# Patient Record
Sex: Female | Born: 1970 | Race: White | Hispanic: No | Smoking: Never smoker
Health system: Southern US, Community
[De-identification: ages and names within clinical notes are randomized; demographics above are authoritative.]

## PROBLEM LIST (undated history)

## (undated) DIAGNOSIS — B977 Papillomavirus as the cause of diseases classified elsewhere: Secondary | ICD-10-CM

## (undated) DIAGNOSIS — G4730 Sleep apnea, unspecified: Secondary | ICD-10-CM

## (undated) DIAGNOSIS — M797 Fibromyalgia: Secondary | ICD-10-CM

## (undated) DIAGNOSIS — I495 Sick sinus syndrome: Secondary | ICD-10-CM

## (undated) DIAGNOSIS — U071 COVID-19: Secondary | ICD-10-CM

## (undated) DIAGNOSIS — F99 Mental disorder, not otherwise specified: Secondary | ICD-10-CM

## (undated) DIAGNOSIS — R87629 Unspecified abnormal cytological findings in specimens from vagina: Secondary | ICD-10-CM

## (undated) DIAGNOSIS — M51369 Other intervertebral disc degeneration, lumbar region without mention of lumbar back pain or lower extremity pain: Secondary | ICD-10-CM

## (undated) DIAGNOSIS — T7840XA Allergy, unspecified, initial encounter: Secondary | ICD-10-CM

## (undated) DIAGNOSIS — D75839 Thrombocytosis, unspecified: Secondary | ICD-10-CM

## (undated) DIAGNOSIS — M87059 Idiopathic aseptic necrosis of unspecified femur: Secondary | ICD-10-CM

## (undated) DIAGNOSIS — K219 Gastro-esophageal reflux disease without esophagitis: Secondary | ICD-10-CM

## (undated) DIAGNOSIS — M21612 Bunion of left foot: Secondary | ICD-10-CM

## (undated) DIAGNOSIS — M5136 Other intervertebral disc degeneration, lumbar region: Secondary | ICD-10-CM

## (undated) DIAGNOSIS — B009 Herpesviral infection, unspecified: Secondary | ICD-10-CM

## (undated) DIAGNOSIS — Z5189 Encounter for other specified aftercare: Secondary | ICD-10-CM

## (undated) DIAGNOSIS — F419 Anxiety disorder, unspecified: Secondary | ICD-10-CM

## (undated) DIAGNOSIS — M069 Rheumatoid arthritis, unspecified: Secondary | ICD-10-CM

## (undated) DIAGNOSIS — D649 Anemia, unspecified: Secondary | ICD-10-CM

## (undated) HISTORY — DX: Allergy, unspecified, initial encounter: T78.40XA

## (undated) HISTORY — DX: Mental disorder, not otherwise specified: F99

## (undated) HISTORY — DX: Other intervertebral disc degeneration, lumbar region: M51.36

## (undated) HISTORY — DX: Other intervertebral disc degeneration, lumbar region without mention of lumbar back pain or lower extremity pain: M51.369

## (undated) HISTORY — DX: Encounter for other specified aftercare: Z51.89

## (undated) HISTORY — DX: Herpesviral infection, unspecified: B00.9

## (undated) HISTORY — DX: Fibromyalgia: M79.7

## (undated) HISTORY — DX: Anemia, unspecified: D64.9

## (undated) HISTORY — DX: Unspecified abnormal cytological findings in specimens from vagina: R87.629

## (undated) HISTORY — DX: Papillomavirus as the cause of diseases classified elsewhere: B97.7

## (undated) HISTORY — DX: Gastro-esophageal reflux disease without esophagitis: K21.9

## (undated) HISTORY — DX: Sleep apnea, unspecified: G47.30

## (undated) HISTORY — PX: REDUCTION MAMMAPLASTY: SUR839

## (undated) HISTORY — DX: Anxiety disorder, unspecified: F41.9

## (undated) HISTORY — DX: Rheumatoid arthritis, unspecified: M06.9

## (undated) HISTORY — DX: Idiopathic aseptic necrosis of unspecified femur: M87.059

## (undated) HISTORY — DX: COVID-19: U07.1

## (undated) HISTORY — DX: Thrombocytosis, unspecified: D75.839

## (undated) HISTORY — PX: OTHER SURGICAL HISTORY: SHX169

## (undated) HISTORY — DX: Bunion of left foot: M21.612

## (undated) HISTORY — DX: Sick sinus syndrome: I49.5

## (undated) HISTORY — PX: JOINT REPLACEMENT: SHX530

## (undated) HISTORY — PX: BREAST BIOPSY: SHX20

---

## 2008-11-08 HISTORY — PX: GASTRIC BYPASS: SHX52

## 2016-11-08 HISTORY — PX: SHOULDER ARTHROSCOPY: SHX128

## 2019-06-01 HISTORY — PX: REVISION TOTAL KNEE ARTHROPLASTY: SHX767

## 2019-08-07 HISTORY — PX: TUBAL LIGATION: SHX77

## 2019-08-09 HISTORY — PX: OTHER SURGICAL HISTORY: SHX169

## 2019-08-22 HISTORY — PX: HYSTEROSCOPY WITH D & C: SHX1775

## 2020-08-06 HISTORY — PX: PACEMAKER PLACEMENT: SHX43

## 2020-10-14 ENCOUNTER — Emergency Department
Admission: EM | Admit: 2020-10-14 | Discharge: 2020-10-14 | Disposition: A | Discharge status: Home / Self Care | Payer: BLUE CROSS/BLUE SHIELD | Attending: Student in an Organized Health Care Education/Training Program | Admitting: Student in an Organized Health Care Education/Training Program

## 2020-10-14 ENCOUNTER — Other Ambulatory Visit: Payer: Self-pay

## 2020-10-14 DIAGNOSIS — N10 Acute tubulo-interstitial nephritis: Secondary | ICD-10-CM | POA: Insufficient documentation

## 2020-10-14 DIAGNOSIS — R300 Dysuria: Secondary | ICD-10-CM | POA: Diagnosis present

## 2020-10-14 DIAGNOSIS — N390 Urinary tract infection, site not specified: Secondary | ICD-10-CM | POA: Diagnosis not present

## 2020-10-14 DIAGNOSIS — N12 Tubulo-interstitial nephritis, not specified as acute or chronic: Secondary | ICD-10-CM

## 2020-10-14 LAB — URINALYSIS, COMPLETE (UACMP) WITH MICROSCOPIC
Bilirubin Urine: NEGATIVE
Glucose, UA: NEGATIVE mg/dL
Ketones, ur: 5 mg/dL — AB
Nitrite: POSITIVE — AB
Protein, ur: 30 mg/dL — AB
RBC / HPF: >50 RBC/hpf — ABNORMAL HIGH (ref 0–5)
Specific Gravity, Urine: 1.02 (ref 1.005–1.030)
WBC, UA: >50 WBC/hpf — ABNORMAL HIGH (ref 0–5)
pH: 5 (ref 5.0–8.0)

## 2020-10-14 MED ORDER — LEVOFLOXACIN 750 MG PO TABS: 750.0000 mg | ORAL_TABLET | Freq: Every day | ORAL | 0 refills | Status: AC

## 2020-10-14 MED ORDER — LEVOFLOXACIN 750 MG PO TABS
750.0000 mg | ORAL_TABLET | Freq: Once | ORAL | Status: AC
  Administered 2020-10-14: 750 mg via ORAL
  Filled 2020-10-14: qty 1

## 2020-10-14 NOTE — ED Notes (Signed)
Pt agreeable with d/c plan as discussed by provider- this nurse has verbally reinforced d/c instructions and provided pt with written copy - pt acknowledges verbal understanding and denies any additional questions concerns needs.  Ambulatory at discharge; steady gait; no acute distress.

## 2020-10-14 NOTE — ED Notes (Signed)
Called lab to add on urine culture to previously sent specimen. Per Marylene Land in Lab, will add on urine culture.

## 2020-10-14 NOTE — Discharge Instructions (Addendum)
Take Levaquin once daily for the next five days.  Return to the emergency department for reevaluation with worsening back pain, nausea, vomiting or fever.

## 2020-10-14 NOTE — ED Triage Notes (Signed)
Pt comes pov with possible uti/kidney infection. Hx of same. Symptoms of same.

## 2020-10-14 NOTE — ED Provider Notes (Signed)
____________________________________________  Time seen: Approximately 6:56 PM  I have reviewed the triage vital signs and the nursing notes.   HISTORY  Chief Complaint Urinary Tract Infection   Historian Patient    HPI Diana Giles is a 49 y.o. female presents to the emergency department with dysuria and increased urinary frequency for the past 2 days.  Patient denies low back pain.  She has noticed hematuria.  She has had chills and has not taken her temperature at home.  She reports a history of UTIs in the past and states that her last UTI was in October and she was on Macrobid.  She denies changes in vaginal discharge or dyspareunia.  No other alleviating measures been attempted.   History reviewed. No pertinent past medical history.    Immunizations up to date:  Yes.     History reviewed. No pertinent past medical history.  There are no problems to display for this patient.     Prior to Admission medications   Medication Sig Start Date End Date Taking? Authorizing Provider  levofloxacin (LEVAQUIN) 750 MG tablet Take 1 tablet (750 mg total) by mouth daily for 5 days. 10/14/20 10/19/20  Orvil Feil, PA-C    Allergies Nickel, Penicillins, and Shellfish allergy  History reviewed. No pertinent family history.  Social History Social History   Tobacco Use  . Smoking status: Not on file  Substance Use Topics  . Alcohol use: Not on file  . Drug use: Not on file     Review of Systems  Constitutional: No fever/chills Eyes:  No discharge ENT: No upper respiratory complaints. Respiratory: no cough. No SOB/ use of accessory muscles to breath Gastrointestinal:   No nausea, no vomiting.  No diarrhea.  No constipation. Genitourinary: Patient has dysuria and increased urinary frequency.  Musculoskeletal: Negative for musculoskeletal pain. Skin: Negative for rash, abrasions, lacerations,  ecchymosis.    ____________________________________________   PHYSICAL EXAM:  VITAL SIGNS: ED Triage Vitals  Enc Vitals Group     BP 10/14/20 1634 (!) 102/48     Pulse Rate 10/14/20 1634 91     Resp 10/14/20 1634 18     Temp 10/14/20 1634 98.5 F (36.9 C)     Temp Source 10/14/20 1634 Oral     SpO2 10/14/20 1634 100 %     Weight 10/14/20 1632 195 lb (88.5 kg)     Height 10/14/20 1632 5\' 1"  (1.549 m)     Head Circumference --      Peak Flow --      Pain Score 10/14/20 1632 8     Pain Loc --      Pain Edu? --      Excl. in GC? --      Constitutional: Alert and oriented. Well appearing and in no acute distress. Eyes: Conjunctivae are normal. PERRL. EOMI. Head: Atraumatic.  Cardiovascular: Normal rate, regular rhythm. Normal S1 and S2.  Good peripheral circulation. Respiratory: Normal respiratory effort without tachypnea or retractions. Lungs CTAB. Good air entry to the bases with no decreased or absent breath sounds Gastrointestinal: Bowel sounds x 4 quadrants. Soft and nontender to palpation. No guarding or rigidity. No distention. Genitourinary: No CVA tenderness. Musculoskeletal: Full range of motion to all extremities. No obvious deformities noted Neurologic:  Normal for age. No gross focal neurologic deficits are appreciated.  Skin:  Skin is warm, dry and intact. No rash noted. Psychiatric: Mood and affect are normal for age. Speech and behavior are normal.  ____________________________________________   LABS (all labs ordered are listed, but only abnormal results are displayed)  Labs Reviewed  URINALYSIS, COMPLETE (UACMP) WITH MICROSCOPIC - Abnormal; Notable for the following components:      Result Value   Color, Urine AMBER (*)    APPearance CLOUDY (*)    Hgb urine dipstick LARGE (*)    Ketones, ur 5 (*)    Protein, ur 30 (*)    Nitrite POSITIVE (*)    Leukocytes,Ua MODERATE (*)    RBC / HPF >50 (*)    WBC, UA >50 (*)    Bacteria, UA MANY (*)    All  other components within normal limits  URINE CULTURE   ____________________________________________  EKG   ____________________________________________  RADIOLOGY  No results found.  ____________________________________________    PROCEDURES  Procedure(s) performed:     Procedures     Medications  levofloxacin (LEVAQUIN) tablet 750 mg (has no administration in time range)     ____________________________________________   INITIAL IMPRESSION / ASSESSMENT AND PLAN / ED COURSE  Pertinent labs & imaging results that were available during my care of the patient were reviewed by me and considered in my medical decision making (see chart for details).    Assessment and plan Pyelonephritis 49 year old female presents to the emergency department with dysuria, hematuria and increased urinary frequency.  Urinalysis shows many bacteria, moderate leukocytes and a large amount of blood.  Patient has a history of pyelonephritis in the past.  We will treat with Levaquin to cover patient for pyelonephritis.  Patient was started on her first dose in the emergency department.  She was given return precautions to return with worsening low back pain, nausea, vomiting or fever.  She voiced understanding and has easy access to the emergency department should her symptoms change or worsen.     ____________________________________________  FINAL CLINICAL IMPRESSION(S) / ED DIAGNOSES  Final diagnoses:  Pyelonephritis      NEW MEDICATIONS STARTED DURING THIS VISIT:  ED Discharge Orders         Ordered    levofloxacin (LEVAQUIN) 750 MG tablet  Daily        10/14/20 1852              This chart was dictated using voice recognition software/Dragon. Despite best efforts to proofread, errors can occur which can change the meaning. Any change was purely unintentional.     Orvil Feil, PA-C 10/14/20 1900    Willy Eddy, MD 10/14/20 2221

## 2020-10-17 LAB — URINE CULTURE: Culture: >=100000 — AB

## 2020-10-18 NOTE — Progress Notes (Signed)
ED Antimicrobial Stewardship Positive Culture Follow Up   Diana Giles is an 49 y.o. female who presented to [facility] on 10/14/2020 with a chief complaint of  Chief Complaint  Patient presents with  . Urinary Tract Infection    Recent Results (from the past 720 hour(s))  Urine culture     Status: Abnormal   Collection Time: 10/14/20  3:25 PM   Specimen: Urine, Random  Result Value Ref Range Status   Specimen Description   Final    URINE, RANDOM Performed at [facility], [address] [place], [place] [address]    Special Requests   Final    NONE Performed at [facility], [address] [place], [place] [address]    Culture (A)  Final    >=100,000 COLONIES/mL ESCHERICHIA COLI Confirmed Extended Spectrum Beta-Lactamase Producer (ESBL).  In bloodstream infections from ESBL organisms, carbapenems are preferred over piperacillin/tazobactam. They are shown to have a lower risk of mortality.    Report Status 10/17/2020 FINAL  Final   Organism ID, Bacteria ESCHERICHIA COLI (A)  Final      Susceptibility   Escherichia coli - MIC*    AMPICILLIN >=32 RESISTANT Resistant     CEFAZOLIN >=64 RESISTANT Resistant     CEFEPIME 16 RESISTANT Resistant     CEFTRIAXONE >=64 RESISTANT Resistant     CIPROFLOXACIN >=4 RESISTANT Resistant     GENTAMICIN >=16 RESISTANT Resistant     IMIPENEM <=0.25 SENSITIVE Sensitive     NITROFURANTOIN <=16 SENSITIVE Sensitive     TRIMETH/SULFA <=20 SENSITIVE Sensitive     AMPICILLIN/SULBACTAM >=32 RESISTANT Resistant     PIP/TAZO <=4 SENSITIVE Sensitive     * >=100,000 COLONIES/mL ESCHERICHIA COLI    [x]  Treated with levaquin, organism resistant to prescribed antimicrobial  (ESBL E.coli)  New antibiotic prescription: Fosfomycin 3gm packet- take  Orally every other day x 3 Doses (packets).  If not improved, then see PCP for possible set up of outpatient IV antibiotic? Or if unable to set up outpatient abx then return to hospital  for assessment.  ED Provider:  10/18/20  1505.  Called pt phone # in chart [phone].  Left voicemail.   Kiyonna Tortorelli A 10/18/2020, 3:06 PM Clinical Pharmacist

## 2020-10-18 NOTE — Progress Notes (Signed)
ED Antimicrobial Stewardship Positive Culture Follow Up   Diana Giles is an 49 y.o. female who presented to [facility] on 10/14/2020 with a chief complaint of  Chief Complaint  Patient presents with  . Urinary Tract Infection    Recent Results (from the past 720 hour(s))  Urine culture     Status: Abnormal   Collection Time: 10/14/20  3:25 PM   Specimen: Urine, Random  Result Value Ref Range Status   Specimen Description   Final    URINE, RANDOM Performed at [facility], [address] [place], [place] [address]    Special Requests   Final    NONE Performed at [facility], [address] [place], [place] [address]    Culture (A)  Final    >=100,000 COLONIES/mL ESCHERICHIA COLI Confirmed Extended Spectrum Beta-Lactamase Producer (ESBL).  In bloodstream infections from ESBL organisms, carbapenems are preferred over piperacillin/tazobactam. They are shown to have a lower risk of mortality.    Report Status 10/17/2020 FINAL  Final   Organism ID, Bacteria ESCHERICHIA COLI (A)  Final      Susceptibility   Escherichia coli - MIC*    AMPICILLIN >=32 RESISTANT Resistant     CEFAZOLIN >=64 RESISTANT Resistant     CEFEPIME 16 RESISTANT Resistant     CEFTRIAXONE >=64 RESISTANT Resistant     CIPROFLOXACIN >=4 RESISTANT Resistant     GENTAMICIN >=16 RESISTANT Resistant     IMIPENEM <=0.25 SENSITIVE Sensitive     NITROFURANTOIN <=16 SENSITIVE Sensitive     TRIMETH/SULFA <=20 SENSITIVE Sensitive     AMPICILLIN/SULBACTAM >=32 RESISTANT Resistant     PIP/TAZO <=4 SENSITIVE Sensitive     * >=100,000 COLONIES/mL ESCHERICHIA COLI    [x]  Treated with levaquin, organism resistant to prescribed antimicrobial  (ESBL E.coli)  New antibiotic prescription: Fosfomycin 3gm packet- take  1 packet Orally every other day x 3 Doses (packets).  If not improved, then see PCP for possible set up of outpatient IV antibiotic? Or if unable to set up outpatient abx then return to  hospital for assessment.  ED Provider:  10/18/20  1505.  Called pt phone # in chart [phone].  Left voicemail. 10/18/20 1525- patient returned call. RPh called in above Rx to [facility] in [place]. Explained to patient to stop taking levofloxacin and start new antibiotic (along with info above)   Muneer Leider A 10/18/2020, 3:28 PM Clinical Pharmacist

## 2020-11-13 ENCOUNTER — Other Ambulatory Visit: Payer: Self-pay

## 2020-11-13 ENCOUNTER — Ambulatory Visit (INDEPENDENT_AMBULATORY_CARE_PROVIDER_SITE_OTHER): Payer: No Typology Code available for payment source | Admitting: Nurse Practitioner

## 2020-11-13 ENCOUNTER — Encounter (INDEPENDENT_AMBULATORY_CARE_PROVIDER_SITE_OTHER): Payer: Self-pay | Admitting: Nurse Practitioner

## 2020-11-13 VITALS — BP 108/70 | HR 96 | Temp 96.9°F | Ht 60.25 in | Wt 192.0 lb

## 2020-11-13 DIAGNOSIS — M069 Rheumatoid arthritis, unspecified: Secondary | ICD-10-CM | POA: Diagnosis not present

## 2020-11-13 DIAGNOSIS — I495 Sick sinus syndrome: Secondary | ICD-10-CM | POA: Insufficient documentation

## 2020-11-13 DIAGNOSIS — Z8639 Personal history of other endocrine, nutritional and metabolic disease: Secondary | ICD-10-CM | POA: Diagnosis not present

## 2020-11-13 DIAGNOSIS — F419 Anxiety disorder, unspecified: Secondary | ICD-10-CM

## 2020-11-13 DIAGNOSIS — R5383 Other fatigue: Secondary | ICD-10-CM | POA: Diagnosis not present

## 2020-11-13 DIAGNOSIS — M5136 Other intervertebral disc degeneration, lumbar region: Secondary | ICD-10-CM

## 2020-11-13 DIAGNOSIS — D75839 Thrombocytosis, unspecified: Secondary | ICD-10-CM | POA: Insufficient documentation

## 2020-11-13 DIAGNOSIS — E669 Obesity, unspecified: Secondary | ICD-10-CM | POA: Diagnosis not present

## 2020-11-13 DIAGNOSIS — M51369 Other intervertebral disc degeneration, lumbar region without mention of lumbar back pain or lower extremity pain: Secondary | ICD-10-CM

## 2020-11-13 DIAGNOSIS — F909 Attention-deficit hyperactivity disorder, unspecified type: Secondary | ICD-10-CM

## 2020-11-13 DIAGNOSIS — M87051 Idiopathic aseptic necrosis of right femur: Secondary | ICD-10-CM

## 2020-11-13 DIAGNOSIS — Z6837 Body mass index (BMI) 37.0-37.9, adult: Secondary | ICD-10-CM

## 2020-11-13 NOTE — Progress Notes (Signed)
Subjective:  Patient ID: Diana Giles, female    DOB: February 23, 1971  Age: 50 y.o. MRN: [id number]  CC:  Chief Complaint  Patient presents with  . Establish Care      HPI  This patient arrives today for the above.  She moved to the area from [place] a few months ago and she is now establishing care here as her primary office.  She has multiple medical problems that need referrals to specialist.  Rheumatoid arthritis: Tells me she was diagnosed with rheumatoid arthritis in the past and this has resulted in significant pain to the joints of her hands, feet, and knee.  She has undergone total knee replacement to the right knee twice.  She is not currently on any chronic medication for rheumatoid arthritis that would like to be evaluated and managed by rheumatologist in the area.  DDD: She has history of degenerative disc disease and tells me specifically its from L3-L5.  She did have an epidural pain shot administered back in [place], and tells me she was told to follow-up with provider that administered these.  She would like referral to someone in this area that can help manage this.  Avascular necrosis of the right hip: She tells me that she was diagnosed with this greater than 10 years ago.  She tells me she is in chronic pain and will often times fall.  She does use a cane as needed.  As far she knows she has not been told what specific treatment she may need to undergo.  She tells me that she thinks at one point they are planning on having her undergo MRI but this never was completed.  Sinus node dysfunction: She tells me back in September 2021 she had a syncopal episode.  She was evaluated in the hospital and was diagnosed with sinus node dysfunction.  Pacemaker was ultimately placed.  She was told to follow-up with cardiology on a regular basis and needs to be seen by cardiology in this area.  Thrombocytosis: She tells me she has a history of elevated platelets and was being  evaluated and followed by hematology in the past.  She tells me that her platelet count has been up to 700/800.  She tells me last time it was checked it was in the low 500s.  Anxiety/ADHD: She also has a history of anxiety and ADHD for which she takes hydroxyzine as needed, Klonopin as needed, and Adderall on days that she works.    Past Medical History:  Diagnosis Date  . Anxiety   . Avascular necrosis of hip (HCC)   . Bunion of left foot   . COVID-19   . DDD (degenerative disc disease), lumbar   . Rheumatoid arthritis (HCC)   . Sinus node dysfunction (HCC)   . Thrombocytosis       History reviewed. No pertinent family history.  Social History   Social History Narrative  . Not on file   Social History   Tobacco Use  . Smoking status: Never Smoker  . Smokeless tobacco: Never Used  Substance Use Topics  . Alcohol use: Not Currently     Current Meds  Medication Sig  . acetaminophen (TYLENOL) 500 MG tablet Take 500 mg by mouth every 6 (six) hours as needed.  Marland Kitchen amphetamine-dextroamphetamine (ADDERALL XR) 20 MG 24 hr capsule Take 20 mg by mouth daily as needed.  . clonazePAM (KLONOPIN) 1 MG tablet Take 1 mg by mouth 3 (three) times daily as  needed for anxiety.  . DULoxetine (CYMBALTA) 60 MG capsule Take 60 mg by mouth 2 (two) times daily.  Marland Kitchen EPINEPHrine 0.3 mg/0.3 mL IJ SOAJ injection Inject 0.3 mg into the muscle as needed for anaphylaxis.  . hydrOXYzine (VISTARIL) 25 MG capsule Take 25 mg by mouth 3 (three) times daily as needed.  Marland Kitchen ibuprofen (ADVIL) 200 MG tablet Take 400 mg by mouth every 6 (six) hours as needed.    ROS:  See HPI   Objective:   Today's Vitals: BP 108/70   Pulse 96   Temp (!) 96.9 F (36.1 C) (Temporal)   Ht 5' 0.25" (1.53 m)   Wt 192 lb (87.1 kg)   SpO2 98%   BMI 37.19 kg/m  Vitals with BMI 11/13/2020 10/14/2020  Height 5' 0.25" 5\' 1"   Weight 192 lbs 195 lbs  BMI 16.1 09.60  Systolic 454 098  Diastolic 70 48  Pulse 96 91      Physical Exam Vitals reviewed.  Constitutional:      General: She is not in acute distress.    Appearance: Normal appearance.  HENT:     Head: Normocephalic and atraumatic.  Neck:     Vascular: No carotid bruit.  Cardiovascular:     Rate and Rhythm: Normal rate and regular rhythm.     Pulses: Normal pulses.     Heart sounds: Normal heart sounds.  Pulmonary:     Effort: Pulmonary effort is normal.     Breath sounds: Normal breath sounds.  Skin:    General: Skin is warm and dry.  Neurological:     General: No focal deficit present.     Mental Status: She is alert and oriented to person, place, and time.  Psychiatric:        Mood and Affect: Mood normal.        Behavior: Behavior normal.        Judgment: Judgment normal.          Assessment and Plan   1. Rheumatoid arthritis, involving unspecified site, unspecified whether rheumatoid factor present ([place])   2. History of vitamin D deficiency   3. Fatigue, unspecified type   4. Class 2 obesity with body mass index (BMI) of 37.0 to 37.9 in adult, unspecified obesity type, unspecified whether serious comorbidity present   5. DDD (degenerative disc disease), lumbar   6. Avascular necrosis of bone of hip, right ([place])   7. Sinus node dysfunction (HCC)   8. Thrombocytosis   9. Anxiety   10. Attention deficit hyperactivity disorder (ADHD), unspecified ADHD type      Plan: 1.  We will refer patient to rheumatology for further assistance in managing her rheumatoid arthritis. 2.-4.  We will check blood work today for further evaluation. 5.-6.  We will refer patient to neurosurgery for of follow-up epidural injections related to the treatment of her degenerative disc disease, and to orthopedics for further assistance with evaluation and management of avascular necrosis of the right hip. 7.  Will refer patient to cardiology. 8.  We will check blood work today and if platelet count is exceptionally high will consider referral  to hematology. 9.-10.  Will refer patient to psychiatry for further assistance in managing her ADHD and anxiety.    Tests ordered No orders of the defined types were placed in this encounter.     No orders of the defined types were placed in this encounter.   Patient to follow-up in 1 month for annual physical exam  or sooner as needed.  [name], NP

## 2020-11-19 LAB — CBC WITH DIFFERENTIAL/PLATELET
Absolute Monocytes: 485 cells/uL (ref 200–950)
Basophils Absolute: 67 cells/uL (ref 0–200)
Basophils Relative: 0.7 %
Eosinophils Absolute: 124 cells/uL (ref 15–500)
Eosinophils Relative: 1.3 %
HCT: 36.8 % (ref 35.0–45.0)
Hemoglobin: 12.2 g/dL (ref 11.7–15.5)
Lymphs Abs: 2603 cells/uL (ref 850–3900)
MCH: 29.4 pg (ref 27.0–33.0)
MCHC: 33.2 g/dL (ref 32.0–36.0)
MCV: 88.7 fL (ref 80.0–100.0)
MPV: 9.8 fL (ref 7.5–12.5)
Monocytes Relative: 5.1 %
Neutro Abs: 6223 cells/uL (ref 1500–7800)
Neutrophils Relative %: 65.5 %
Platelets: 449 10*3/uL — ABNORMAL HIGH (ref 140–400)
RBC: 4.15 10*6/uL (ref 3.80–5.10)
RDW: 12.6 % (ref 11.0–15.0)
Total Lymphocyte: 27.4 %
WBC: 9.5 10*3/uL (ref 3.8–10.8)

## 2020-11-19 LAB — CULTURE INDICATED

## 2020-11-19 LAB — T4, FREE: Free T4: 1 ng/dL (ref 0.8–1.8)

## 2020-11-19 LAB — URINALYSIS W MICROSCOPIC + REFLEX CULTURE
Bacteria, UA: NONE SEEN /HPF
Bilirubin Urine: NEGATIVE
Glucose, UA: NEGATIVE
Hgb urine dipstick: NEGATIVE
Hyaline Cast: NONE SEEN /LPF
Ketones, ur: NEGATIVE
Nitrites, Initial: NEGATIVE
Protein, ur: NEGATIVE
RBC / HPF: NONE SEEN /HPF (ref 0–2)
Specific Gravity, Urine: 1.008 (ref 1.001–1.03)
pH: 6 (ref 5.0–8.0)

## 2020-11-19 LAB — LIPID PANEL
Cholesterol: 182 mg/dL (ref <200)
HDL: 83 mg/dL (ref >=50)
LDL Cholesterol (Calc): 79 mg/dL (calc)
Non-HDL Cholesterol (Calc): 99 mg/dL (calc) (ref <130)
Total CHOL/HDL Ratio: 2.2 (calc) (ref <5.0)
Triglycerides: 115 mg/dL (ref <150)

## 2020-11-19 LAB — HEMOGLOBIN A1C
Hgb A1c MFr Bld: 5.7 % of total Hgb — ABNORMAL HIGH (ref <5.7)
Mean Plasma Glucose: 117 mg/dL
eAG (mmol/L): 6.5 mmol/L

## 2020-11-19 LAB — ANA: Anti Nuclear Antibody (ANA): NEGATIVE

## 2020-11-19 LAB — URINE CULTURE: Result:: NO GROWTH

## 2020-11-19 LAB — TSH: TSH: 0.93 mIU/L

## 2020-11-19 LAB — VITAMIN D 25 HYDROXY (VIT D DEFICIENCY, FRACTURES): Vit D, 25-Hydroxy: 26 ng/mL — ABNORMAL LOW (ref 30–100)

## 2020-11-19 LAB — T3, FREE: T3, Free: 2.9 pg/mL (ref 2.3–4.2)

## 2020-11-19 LAB — RHEUMATOID FACTOR: Rheumatoid fact SerPl-aCnc: 15 IU/mL — ABNORMAL HIGH (ref <14)

## 2020-11-24 ENCOUNTER — Telehealth: Payer: Self-pay

## 2020-11-24 NOTE — Telephone Encounter (Signed)
lvm to reschedule appt.

## 2020-11-28 ENCOUNTER — Ambulatory Visit: Payer: Self-pay | Admitting: Nurse Practitioner

## 2020-12-02 ENCOUNTER — Encounter (INDEPENDENT_AMBULATORY_CARE_PROVIDER_SITE_OTHER): Payer: Self-pay

## 2020-12-03 ENCOUNTER — Telehealth (INDEPENDENT_AMBULATORY_CARE_PROVIDER_SITE_OTHER): Payer: Self-pay

## 2020-12-03 NOTE — Telephone Encounter (Signed)
Dr. Karilyn Cota, this patient states that psych denied her referral. We were referring her for medical management of her anxiety and ADHD. She is on both adderrall and klonopin as well as cymbalta and hydroxyzine. What other referral would you recommend so that she has a mental health provider that can provide her with refills?

## 2020-12-03 NOTE — Telephone Encounter (Signed)
That is surprising that psychiatry denied/declined the referral.  The other option would be to refer her to [facility] in [place] and they should be able to evaluate her and refill her Adderall at least. Nellie, can you refer this patient to this practice for ADHD?  Thanks.

## 2020-12-03 NOTE — Telephone Encounter (Signed)
Patient stated that she was declined by Psychiatry referral as she stated that she did not need counseling and she used to see a Optometrist for a televisit every 3 months to continue her prescription. Patient is asking who she needs to try to be referred to for her refills? Or if she calls her insurance what type of referral should she ask for so she can verify coverage? Please advise.

## 2020-12-03 NOTE — Telephone Encounter (Signed)
Filled out [facility] form & copy of attached order & notes for services ASAP. Pending appt they will send a appt back status of appt in a few days of review. TBA

## 2020-12-08 ENCOUNTER — Other Ambulatory Visit: Payer: Self-pay

## 2020-12-08 ENCOUNTER — Encounter (INDEPENDENT_AMBULATORY_CARE_PROVIDER_SITE_OTHER): Payer: Self-pay | Admitting: Internal Medicine

## 2020-12-08 ENCOUNTER — Ambulatory Visit (INDEPENDENT_AMBULATORY_CARE_PROVIDER_SITE_OTHER): Payer: No Typology Code available for payment source | Admitting: Internal Medicine

## 2020-12-08 VITALS — BP 112/78 | HR 70 | Temp 96.6°F | Ht 60.25 in | Wt 188.8 lb

## 2020-12-08 DIAGNOSIS — L03319 Cellulitis of trunk, unspecified: Secondary | ICD-10-CM

## 2020-12-08 MED ORDER — LEVOFLOXACIN 500 MG PO TABS: 500.0000 mg | ORAL_TABLET | Freq: Every day | ORAL | 0 refills | Status: DC

## 2020-12-08 NOTE — Progress Notes (Signed)
Metrics: Intervention Frequency ACO  Documented Smoking Status Yearly  Screened one or more times in 24 months  Cessation Counseling or  Active cessation medication Past 24 months  Past 24 months   Guideline developer: UpToDate (See UpToDate for funding source) Date Released: 2014       Wellness Office Visit  Subjective:  Patient ID: Diana Giles, female    DOB: 1971-11-01  Age: 50 y.o. MRN: [id number]  CC: Skin lesion right breast with erythema. HPI  The patient describes sudden onset of itchy area on the right lower breast and then erythematous area which started 2 to 3 days ago.  She had not noticed it before.  She denies any fever. Past Medical History:  Diagnosis Date  . Anxiety   . Avascular necrosis of hip (HCC)   . Bunion of left foot   . COVID-19   . DDD (degenerative disc disease), lumbar   . Rheumatoid arthritis (HCC)   . Sinus node dysfunction (HCC)   . Thrombocytosis    Past Surgical History:  Procedure Laterality Date  . GASTRIC BYPASS  2010  . HYSTEROSCOPY WITH D & C  08/22/2019  . OTHER SURGICAL HISTORY     Bilateral fallopian tube removal  . PACEMAKER PLACEMENT  08/06/2020  . REVISION TOTAL KNEE ARTHROPLASTY Right 06/01/2019  . SHOULDER ARTHROSCOPY Left 2018     History reviewed. No pertinent family history.  Social History   Social History Narrative  . Not on file   Social History   Tobacco Use  . Smoking status: Never Smoker  . Smokeless tobacco: Never Used  Substance Use Topics  . Alcohol use: Not Currently    Current Meds  Medication Sig  . acetaminophen (TYLENOL) 500 MG tablet Take 500 mg by mouth every 6 (six) hours as needed.  Marland Kitchen amphetamine-dextroamphetamine (ADDERALL XR) 20 MG 24 hr capsule Take 20 mg by mouth daily as needed.  . clonazePAM (KLONOPIN) 1 MG tablet Take 1 mg by mouth 3 (three) times daily as needed for anxiety.  . DULoxetine (CYMBALTA) 60 MG capsule Take 60 mg by mouth 2 (two) times daily.  Marland Kitchen EPINEPHrine 0.3  mg/0.3 mL IJ SOAJ injection Inject 0.3 mg into the muscle as needed for anaphylaxis.  . hydrOXYzine (VISTARIL) 25 MG capsule Take 25 mg by mouth 3 (three) times daily as needed.  Marland Kitchen ibuprofen (ADVIL) 200 MG tablet Take 400 mg by mouth every 6 (six) hours as needed.  Marland Kitchen levofloxacin (LEVAQUIN) 500 MG tablet Take 1 tablet (500 mg total) by mouth daily.      Depression screen [facility] 2/9 11/13/2020  Decreased Interest 0  Down, Depressed, Hopeless 0  PHQ - 2 Score 0  Altered sleeping 0  Tired, decreased energy 0  Change in appetite 0  Feeling bad or failure about yourself  0  Trouble concentrating 0  Moving slowly or fidgety/restless 0  Suicidal thoughts 0  PHQ-9 Score 0  Difficult doing work/chores Not difficult at all     Objective:   Today's Vitals: BP 112/78   Pulse 70   Temp (!) 96.6 F (35.9 C) (Temporal)   Ht 5' 0.25" (1.53 m)   Wt 188 lb 12.8 oz (85.6 kg)   SpO2 99%   BMI 36.57 kg/m  Vitals with BMI 12/08/2020 11/13/2020 10/14/2020  Height 5' 0.25" 5' 0.25" 5\' 1"   Weight 188 lbs 13 oz 192 lbs 195 lbs  BMI 36.58 37.2 36.86  Systolic 112 108  Diastolic 78 70 48  Pulse 70 96 91     Physical Exam  Low part of the right breast shows a 1 cm brownish discolored lesion with surrounding erythema.  I am not sure what this is exactly.     Assessment   1. Cellulitis of trunk, unspecified site of trunk       Tests ordered No orders of the defined types were placed in this encounter.    Plan: 1. I am going to treat her empirically with Levaquin as she is allergic to penicillins and sulfa.  I warned her of the rare side effect of tendon problems.  She will apply local Neosporin and keep it covered. 2. She will follow-up with Maralyn Sago as previously scheduled in about 10 days and this can be reviewed again.  If it does not improve, she will probably need dermatological referral.   Meds ordered this encounter  Medications  . levofloxacin (LEVAQUIN) 500 MG tablet    Sig:  Take 1 tablet (500 mg total) by mouth daily.    Dispense:  7 tablet    Refill:  0    Kazi Montoro Normajean Glasgow, MD

## 2020-12-11 ENCOUNTER — Encounter: Payer: Self-pay | Admitting: Radiology

## 2020-12-12 ENCOUNTER — Encounter: Payer: Self-pay | Admitting: Cardiovascular Disease

## 2020-12-12 ENCOUNTER — Other Ambulatory Visit: Payer: Self-pay

## 2020-12-12 ENCOUNTER — Ambulatory Visit: Payer: No Typology Code available for payment source | Admitting: Cardiovascular Disease

## 2020-12-12 VITALS — BP 106/70 | HR 61 | Ht 60.0 in | Wt 195.0 lb

## 2020-12-12 DIAGNOSIS — I495 Sick sinus syndrome: Secondary | ICD-10-CM | POA: Diagnosis not present

## 2020-12-12 DIAGNOSIS — Z950 Presence of cardiac pacemaker: Secondary | ICD-10-CM

## 2020-12-12 NOTE — Progress Notes (Signed)
Cardiology Office Note   Date:  12/12/2020   ID:  Diana Giles, DOB 03/31/1971, MRN [id number]  PCP:  Wilson Singer, MD  Cardiologist:   Lorine Bears, MD   Chief Complaint  Patient presents with  . New Patient (Initial Visit)    Referred by PCP for SA node dysfunction. Patient stated that [place] did a remote transmission on her device yesterday and everything looked good. Meds reviewed verbally with patient.       History of Present Illness: Diana Giles is a 50 y.o. female who was referred by [name] to establish cardiovascular care.  She moved to the area from [place].  She has known history of sinus node dysfunction with syncope.  She had a [employer]onic dual-chamber pacemaker placed in September 2021 by Dr. Lenis Noon She has other chronic medical conditions including rheumatoid arthritis, obesity status post gastric bypass surgery in 2009, avascular necrosis of the right hip, anxiety and thrombocytosis. She has been doing well with no recent chest pain or shortness of breath.  She reports mild palpitations when she first moved to [place] but these improved after she settled.  She works as a Field seismologist.  She is not a smoker.  She does have family history of coronary artery disease.  Her father had myocardial infarction at the age of 6.    Past Medical History:  Diagnosis Date  . Anxiety   . Avascular necrosis of hip (HCC)   . Bunion of left foot   . COVID-19   . DDD (degenerative disc disease), lumbar   . Rheumatoid arthritis (HCC)   . Sinus node dysfunction (HCC)   . Thrombocytosis     Past Surgical History:  Procedure Laterality Date  . GASTRIC BYPASS  2010  . HYSTEROSCOPY WITH D & C  08/22/2019  . OTHER SURGICAL HISTORY     Bilateral fallopian tube removal  . PACEMAKER PLACEMENT  08/06/2020  . REVISION TOTAL KNEE ARTHROPLASTY Right 06/01/2019  . SHOULDER ARTHROSCOPY Left 2018     Current Outpatient Medications   Medication Sig Dispense Refill  . acetaminophen (TYLENOL) 500 MG tablet Take 500 mg by mouth every 6 (six) hours as needed.    Marland Kitchen amphetamine-dextroamphetamine (ADDERALL XR) 20 MG 24 hr capsule Take 20 mg by mouth daily as needed.    . clonazePAM (KLONOPIN) 1 MG tablet Take 1 mg by mouth 3 (three) times daily as needed for anxiety.    . DULoxetine (CYMBALTA) 60 MG capsule Take 60 mg by mouth 2 (two) times daily.    Marland Kitchen EPINEPHrine 0.3 mg/0.3 mL IJ SOAJ injection Inject 0.3 mg into the muscle as needed for anaphylaxis.    . hydrOXYzine (VISTARIL) 25 MG capsule Take 25 mg by mouth 3 (three) times daily as needed.    Marland Kitchen ibuprofen (ADVIL) 200 MG tablet Take 400 mg by mouth every 6 (six) hours as needed.    Marland Kitchen levofloxacin (LEVAQUIN) 500 MG tablet Take 1 tablet (500 mg total) by mouth daily. 7 tablet 0   No current facility-administered medications for this visit.    Allergies:   Nickel, Penicillins, Shellfish allergy, and Sulfa antibiotics    Social History:  The patient  reports that she has never smoked. She has never used smokeless tobacco. She reports previous alcohol use. She reports that she does not use drugs.   Family History:  The patient's family history is remarkable for coronary artery disease.  Father had myocardial infarction at the  age of 9.   ROS:  Please see the history of present illness.   Otherwise, review of systems are positive for none.   All other systems are reviewed and negative.    PHYSICAL EXAM: VS:  BP 106/70 (BP Location: Right Arm, Patient Position: Sitting, Cuff Size: Large)   Pulse 61   Ht 5' (1.524 m)   Wt 195 lb (88.5 kg)   BMI 38.08 kg/m  , BMI Body mass index is 38.08 kg/m. GEN: Well nourished, well developed, in no acute distress  HEENT: normal  Neck: no JVD, carotid bruits, or masses Cardiac: RRR; no murmurs, rubs, or gallops,no edema  Respiratory:  clear to auscultation bilaterally, normal work of breathing GI: soft, nontender, nondistended, +  BS MS: no deformity or atrophy  Skin: warm and dry, no rash Neuro:  Strength and sensation are intact Psych: euthymic mood, full affect   EKG:  EKG is ordered today. The ekg ordered today demonstrates normal sinus rhythm with low voltage.   Recent Labs: 11/17/2020: Hemoglobin 12.2; Platelets 449; TSH 0.93    Lipid Panel    Component Value Date/Time   CHOL 182 11/17/2020 1339   TRIG 115 11/17/2020 1339   HDL 83 11/17/2020 1339   CHOLHDL 2.2 11/17/2020 1339   LDLCALC 79 11/17/2020 1339      Wt Readings from Last 3 Encounters:  12/12/20 195 lb (88.5 kg)  12/08/20 188 lb 12.8 oz (85.6 kg)  11/13/20 192 lb (87.1 kg)       No flowsheet data found.    ASSESSMENT AND PLAN:  1.  Sinus node dysfunction status post dual-chamber pacemaker placement: I am referring to EP to establish care. She seems to be doing well overall with no symptoms of angina or heart failure.  2.  Palpitations: She reports that she had mild palpitations few months ago after her recent move from [place] to [place] but she thinks it was related to stress.  She reports resolution of symptoms over the last month.  Thus, no indication for outpatient monitor.    Disposition: Referred to EP to establish.  Signed,  Lorine Bears, MD  12/12/2020 10:21 AM    [facility]

## 2020-12-12 NOTE — Patient Instructions (Signed)
Medication Instructions:  Your physician recommends that you continue on your current medications as directed. Please refer to the Current Medication list given to you today.  *If you need a refill on your cardiac medications before your next appointment, please call your pharmacy*   Lab Work: None ordered If you have labs (blood work) drawn today and your tests are completely normal, you will receive your results only by: Marland Kitchen MyChart Message (if you have MyChart) OR . A paper copy in the mail If you have any lab test that is abnormal or we need to change your treatment, we will call you to review the results.   Testing/Procedures: None ordered   Follow-Up: At [facility], you and your health needs are our priority.  As part of our continuing mission to provide you with exceptional heart care, we have created designated Provider Care Teams.  These Care Teams include your primary Cardiologist (physician) and Advanced Practice Providers (APPs -  Physician Assistants and Nurse Practitioners) who all work together to provide you with the care you need, when you need it.  We recommend signing up for the patient portal called "MyChart".  Sign up information is provided on this After Visit Summary.  MyChart is used to connect with patients for Virtual Visits (Telemedicine).  Patients are able to view lab/test results, encounter notes, upcoming appointments, etc.  Non-urgent messages can be sent to your provider as well.   To learn more about what you can do with MyChart, go to [email or web address].    Your next appointment :   EP cardiologist for [facility] managment  The format for your next appointment:   In Person  Provider:   Steffanie Dunn, MD   Other Instructions N/A

## 2020-12-17 ENCOUNTER — Other Ambulatory Visit: Payer: Self-pay

## 2020-12-17 ENCOUNTER — Ambulatory Visit (INDEPENDENT_AMBULATORY_CARE_PROVIDER_SITE_OTHER): Payer: No Typology Code available for payment source | Admitting: Nurse Practitioner

## 2020-12-17 VITALS — HR 86 | Resp 16 | Ht 60.25 in | Wt 190.0 lb

## 2020-12-17 DIAGNOSIS — Z6837 Body mass index (BMI) 37.0-37.9, adult: Secondary | ICD-10-CM

## 2020-12-17 DIAGNOSIS — R7303 Prediabetes: Secondary | ICD-10-CM | POA: Diagnosis not present

## 2020-12-17 DIAGNOSIS — F419 Anxiety disorder, unspecified: Secondary | ICD-10-CM

## 2020-12-17 DIAGNOSIS — G629 Polyneuropathy, unspecified: Secondary | ICD-10-CM

## 2020-12-17 DIAGNOSIS — Z1211 Encounter for screening for malignant neoplasm of colon: Secondary | ICD-10-CM

## 2020-12-17 DIAGNOSIS — Z0001 Encounter for general adult medical examination with abnormal findings: Secondary | ICD-10-CM | POA: Diagnosis not present

## 2020-12-17 DIAGNOSIS — Z23 Encounter for immunization: Secondary | ICD-10-CM

## 2020-12-17 DIAGNOSIS — E559 Vitamin D deficiency, unspecified: Secondary | ICD-10-CM

## 2020-12-17 DIAGNOSIS — F909 Attention-deficit hyperactivity disorder, unspecified type: Secondary | ICD-10-CM

## 2020-12-17 DIAGNOSIS — E669 Obesity, unspecified: Secondary | ICD-10-CM

## 2020-12-17 DIAGNOSIS — D75839 Thrombocytosis, unspecified: Secondary | ICD-10-CM

## 2020-12-17 DIAGNOSIS — L03319 Cellulitis of trunk, unspecified: Secondary | ICD-10-CM

## 2020-12-17 DIAGNOSIS — M069 Rheumatoid arthritis, unspecified: Secondary | ICD-10-CM

## 2020-12-17 MED ORDER — CLONAZEPAM 1 MG PO TABS: 1.0000 mg | ORAL_TABLET | Freq: Three times a day (TID) | ORAL | 0 refills | Status: DC | PRN

## 2020-12-17 NOTE — Patient Instructions (Addendum)
Dr. Haydee Salter - [phone] (Rheumatologist)

## 2020-12-17 NOTE — Progress Notes (Signed)
Subjective:  Patient ID: Diana Giles, female    DOB: 25-Apr-1971  Age: 50 y.o. MRN: [id number]  CC:  Chief Complaint  Patient presents with  . Annual Exam      HPI  This patient arrives today for the above.  She has no major complaints today.  She tells me that she has established care with cardiologist and they recommend she see electrophysiology so she is getting scheduled for that.  She also is getting set up with an appointment with neurology or neurosurgery to discuss spinal injections for pain management.  She is also establishing care with a psychiatrist but is in her network within the next few weeks.  She does tell me that she is almost out of her Klonopin and is wondering if she can get a refill for this.  She takes this as needed for anxiety.  We did blood work at last office visit which did show prediabetes with an A1c of 5.7 as well as elevated platelets of 449.  She tells me that in the past she has only been evaluated by hematology when her platelets are closer to 600.  She is due for tetanus shot.  She is due for colonoscopy.  She tells me she has had a Pap smear within the last 2 years as well as mammogram less than 1 year ago.  She does not want to have a clinical breast exam today.  She will be due for hepatitis C screening next time blood work is done.  She does have low vitamin D levels and has started a vitamin D3 supplement, she is taking 5000 IUs daily.  She also mentions that she has a lesion to the right breast/upper abdomen area.  This has been present for approximately 2 weeks.  She tells me it initially started almost as a blister, and then opened and never bled but was painful.  She was placed on antibiotics by Dr. Karilyn Cota about 2 weeks ago.  She has completed the course and tells me the lesion no longer hurts but it does seem scabbed over and appears "white".  She like for this to be evaluated today.  Past Medical History:  Diagnosis Date  . Anxiety   .  Avascular necrosis of hip (HCC)   . Bunion of left foot   . COVID-19   . DDD (degenerative disc disease), lumbar   . Rheumatoid arthritis (HCC)   . Sinus node dysfunction (HCC)   . Thrombocytosis       No family history on file.  Social History   Social History Narrative  . Not on file   Social History   Tobacco Use  . Smoking status: Never Smoker  . Smokeless tobacco: Never Used  Substance Use Topics  . Alcohol use: Not Currently     Current Meds  Medication Sig  . acetaminophen (TYLENOL) 500 MG tablet Take 500 mg by mouth every 6 (six) hours as needed.  Marland Kitchen amphetamine-dextroamphetamine (ADDERALL XR) 20 MG 24 hr capsule Take 20 mg by mouth daily as needed.  . Cholecalciferol 1.25 MG (50000 UT) capsule Take 50,000 Units by mouth daily.  . DULoxetine (CYMBALTA) 60 MG capsule Take 60 mg by mouth 2 (two) times daily.  Marland Kitchen EPINEPHrine 0.3 mg/0.3 mL IJ SOAJ injection Inject 0.3 mg into the muscle as needed for anaphylaxis.  . hydrOXYzine (VISTARIL) 25 MG capsule Take 25 mg by mouth 3 (three) times daily as needed.  Marland Kitchen ibuprofen (ADVIL) 200 MG tablet  Take 400 mg by mouth every 6 (six) hours as needed.  . vitamin B-12 (CYANOCOBALAMIN) 500 MCG tablet Take 500 mcg by mouth daily.  . [DISCONTINUED] clonazePAM (KLONOPIN) 1 MG tablet Take 1 mg by mouth 3 (three) times daily as needed for anxiety.    ROS:  See HPI   Objective:   Today's Vitals: Pulse 86   Resp 16   Ht 5' 0.25" (1.53 m)   Wt 190 lb (86.2 kg)   SpO2 98%   BMI 36.80 kg/m  Vitals with BMI 12/17/2020 12/12/2020 12/12/2020  Height 5' 0.25" - 5\' 0"   Weight 190 lbs - 195 lbs  BMI 36.82 - 38.08  Systolic - 106 110  Diastolic - 70 70  Pulse 86 - 61     Physical Exam Vitals reviewed.  Constitutional:      Appearance: Normal appearance.  HENT:     Head: Normocephalic and atraumatic.     Right Ear: Tympanic membrane, ear canal and external ear normal.     Left Ear: Tympanic membrane, ear canal and external ear  normal.  Eyes:     General:        Right eye: No discharge.        Left eye: No discharge.     Extraocular Movements: Extraocular movements intact.     Conjunctiva/sclera: Conjunctivae normal.     Pupils: Pupils are equal, round, and reactive to light.  Neck:     Vascular: No carotid bruit.  Cardiovascular:     Rate and Rhythm: Normal rate and regular rhythm.     Pulses: Normal pulses.     Heart sounds: Normal heart sounds. No murmur heard.   Pulmonary:     Effort: Pulmonary effort is normal.     Breath sounds: Normal breath sounds.  Chest:  Breasts:     Right: No supraclavicular adenopathy.     Left: No supraclavicular adenopathy.      Comments: Deferred per patient preference Abdominal:     General: Abdomen is flat. Bowel sounds are normal. There is no distension.     Palpations: Abdomen is soft. There is no mass.     Tenderness: There is no abdominal tenderness.  Musculoskeletal:        General: No tenderness.     Cervical back: Neck supple. No muscular tenderness.     Right lower leg: No edema.     Left lower leg: No edema.  Lymphadenopathy:     Cervical: No cervical adenopathy.     Upper Body:     Right upper body: No supraclavicular adenopathy.     Left upper body: No supraclavicular adenopathy.  Skin:    General: Skin is warm and dry.       Neurological:     General: No focal deficit present.     Mental Status: She is alert and oriented to person, place, and time.     Sensory: Sensory deficit (Right lower extremity has reduced sensation; patient reports history of neuropathy) present.     Motor: No weakness.     Gait: Gait normal.  Psychiatric:        Mood and Affect: Mood normal.        Behavior: Behavior normal.        Judgment: Judgment normal.       PHQ9 SCORE ONLY 12/17/2020 11/13/2020  PHQ-9 Total Score 12 0      Assessment and Plan   1. Encounter for general adult medical examination with  abnormal findings   2. Anxiety   3. Colon cancer  screening   4. Need for prophylactic vaccination with combined diphtheria-tetanus-pertussis (DTP) vaccine   5. Prediabetes   6. Rheumatoid arthritis, involving unspecified site, unspecified whether rheumatoid factor present (HCC)   7. Class 2 obesity with body mass index (BMI) of 37.0 to 37.9 in adult, unspecified obesity type, unspecified whether serious comorbidity present   8. Thrombocytosis   9. Attention deficit hyperactivity disorder (ADHD), unspecified ADHD type   10. Neuropathy   11. Vitamin D insufficiency   12. Cellulitis of trunk, unspecified site of trunk      Plan: 1., 3-4.  We will administer tetanus shot today.  Will refer patient to gastroenterology to discuss colon cancer screening.  Will consider screening for hepatitis C next time blood work is drawn. 2.  Refill on Klonopin sent to pharmacy today, patient encouraged to follow-up with psychiatry as soon as possible. 5.,  7.  We did discuss diet and consider intermittent fasting to help with weight loss as well as to help with improving insulin sensitivity.  Because she has a history of gastric bypass we had a discussion of this and per shared decision making patient will try to focus on eating 3 meals a day without snacks at least a few times a week to see if this will help with her insulin sensitivity and weight loss. 6.  She will follow up with rheumatology as scheduled.  She has not heard yet from this office to get an appointment scheduled so I have provided her with the providers name and phone number so she can call them to discuss scheduling appointment. 8.  We will continue to monitor for now, may refer to hematology in the future if platelet level gets higher. 9.  She will follow-up with psychiatry when she is established with them. 10.  Stable no further work-up or management discussed today. 11.  Should continue taking her vitamin D supplement and we will check serum level at next office visit. 12.  Skin lesion  looks like it is healing well.  For now I recommend she continue to monitor it and if the lesion does not improved at all in the next 7 days then will refer to dermatology, otherwise I believe is healing well.  Tests ordered Orders Placed This Encounter  Procedures  . Tdap vaccine greater than or equal to 7yo IM  . Ambulatory referral to Gastroenterology      Meds ordered this encounter  Medications  . clonazePAM (KLONOPIN) 1 MG tablet    Sig: Take 1 tablet (1 mg total) by mouth 3 (three) times daily as needed for anxiety.    Dispense:  30 tablet    Refill:  0    Order Specific Question:   Supervising Provider    Answer:   Wilson Singer [[id number]]    Patient to follow-up in 2 months or sooner as needed.  In addition to performing a physical exam today also performed an office visit to address her concerns as well as chronic conditions.  [name], NP

## 2020-12-26 ENCOUNTER — Encounter: Payer: Self-pay | Admitting: Internal Medicine

## 2021-01-07 ENCOUNTER — Encounter: Payer: Self-pay | Admitting: Cardiology

## 2021-01-07 ENCOUNTER — Ambulatory Visit (INDEPENDENT_AMBULATORY_CARE_PROVIDER_SITE_OTHER): Payer: No Typology Code available for payment source | Admitting: Cardiology

## 2021-01-07 ENCOUNTER — Other Ambulatory Visit: Payer: Self-pay

## 2021-01-07 VITALS — BP 104/78 | HR 67 | Ht 60.25 in | Wt 192.0 lb

## 2021-01-07 DIAGNOSIS — Z950 Presence of cardiac pacemaker: Secondary | ICD-10-CM | POA: Diagnosis not present

## 2021-01-07 DIAGNOSIS — I495 Sick sinus syndrome: Secondary | ICD-10-CM | POA: Diagnosis not present

## 2021-01-07 NOTE — Progress Notes (Signed)
Electrophysiology Office Note:    Date:  01/07/2021   ID:  Diana Giles, DOB 07-05-1971, MRN [id number]  PCP:  Wilson Singer, MD  [facility] Cardiologist:  No primary care provider on file.  [facility] Electrophysiologist:  [name], MD   Referring MD: Iran Ouch, MD   Chief Complaint: sinus node dysfunction  History of Present Illness:    Diana Giles is a 50 y.o. female who presents for an evaluation of sinus node dysfunction and PPM in situ at the request of Dr Kirke Corin. The patient recently moved to [place] area from [place]. She has a history of chronic pain and sinus node dysfunction and syncope. Since her PPM was implanted 08/05/2020 she has not had a recurrent syncopal episode. No problem with the incision at the [facility] site.  Past Medical History:  Diagnosis Date  . Anxiety   . Avascular necrosis of hip (HCC)   . Bunion of left foot   . COVID-19   . DDD (degenerative disc disease), lumbar   . Rheumatoid arthritis (HCC)   . Sinus node dysfunction (HCC)   . Thrombocytosis     Past Surgical History:  Procedure Laterality Date  . GASTRIC BYPASS  2010  . HYSTEROSCOPY WITH D & C  08/22/2019  . OTHER SURGICAL HISTORY     Bilateral fallopian tube removal  . PACEMAKER PLACEMENT  08/06/2020  . REVISION TOTAL KNEE ARTHROPLASTY Right 06/01/2019  . SHOULDER ARTHROSCOPY Left 2018    Current Medications: Current Meds  Medication Sig  . acetaminophen (TYLENOL) 500 MG tablet Take 500 mg by mouth every 6 (six) hours as needed.  Marland Kitchen amphetamine-dextroamphetamine (ADDERALL XR) 20 MG 24 hr capsule Take 20 mg by mouth daily as needed.  . Cholecalciferol 1.25 MG (50000 UT) capsule Take 50,000 Units by mouth daily.  . clonazePAM (KLONOPIN) 1 MG tablet Take 1 tablet (1 mg total) by mouth 3 (three) times daily as needed for anxiety.  . DULoxetine (CYMBALTA) 60 MG capsule Take 60 mg by mouth 2 (two) times daily.  Marland Kitchen EPINEPHrine 0.3 mg/0.3 mL IJ SOAJ injection  Inject 0.3 mg into the muscle as needed for anaphylaxis.  . hydrOXYzine (VISTARIL) 25 MG capsule Take 25 mg by mouth 3 (three) times daily as needed.  Marland Kitchen ibuprofen (ADVIL) 200 MG tablet Take 400 mg by mouth every 6 (six) hours as needed.  . vitamin B-12 (CYANOCOBALAMIN) 500 MCG tablet Take 500 mcg by mouth daily.     Allergies:   Nickel, Penicillins, Shellfish allergy, and Sulfa antibiotics   Social History   Socioeconomic History  . Marital status: Legally Separated    Spouse name: Not on file  . Number of children: Not on file  . Years of education: Not on file  . Highest education level: Not on file  Occupational History  . Not on file  Tobacco Use  . Smoking status: Never Smoker  . Smokeless tobacco: Never Used  Vaping Use  . Vaping Use: Never used  Substance and Sexual Activity  . Alcohol use: Not Currently  . Drug use: Never  . Sexual activity: Not Currently  Other Topics Concern  . Not on file  Social History Narrative  . Not on file   Social Determinants of Health   Financial Resource Strain: Not on file  Food Insecurity: Not on file  Transportation Needs: Not on file  Physical Activity: Not on file  Stress: Not on file  Social Connections: Not on file  Family History: The patient's family history is not on file.  ROS:   Please see the history of present illness.    All other systems reviewed and are negative.  EKGs/Labs/Other Studies Reviewed:    The following studies were reviewed today:  01/07/2021 Device interrogation personally reviewed Lead parameters stable Battery longevity good AAIR>DDDR 50-130 VP < 0.1% AP 7.7%   EKG:  The ekg ordered today demonstrates sinus rhythm.  Recent Labs: 11/17/2020: Hemoglobin 12.2; Platelets 449; TSH 0.93  Recent Lipid Panel    Component Value Date/Time   CHOL 182 11/17/2020 1339   TRIG 115 11/17/2020 1339   HDL 83 11/17/2020 1339   CHOLHDL 2.2 11/17/2020 1339   LDLCALC 79 11/17/2020 1339     Physical Exam:    VS:  BP 104/78 (BP Location: Left Arm, Patient Position: Sitting, Cuff Size: Large)   Pulse 67   Ht 5' 0.25" (1.53 m)   Wt 192 lb (87.1 kg)   SpO2 97%   BMI 37.19 kg/m     Wt Readings from Last 3 Encounters:  01/07/21 192 lb (87.1 kg)  12/17/20 190 lb (86.2 kg)  12/12/20 195 lb (88.5 kg)     GEN:  Well nourished, well developed in no acute distress HEENT: Normal NECK: No JVD; No carotid bruits LYMPHATICS: No lymphadenopathy CARDIAC: RRR, no murmurs, rubs, gallops. PPM pocket well healed without pain at the site. RESPIRATORY:  Clear to auscultation without rales, wheezing or rhonchi  ABDOMEN: Soft, non-tender, non-distended MUSCULOSKELETAL:  No edema; No deformity  SKIN: Warm and dry NEUROLOGIC:  Alert and oriented x 3 PSYCHIATRIC:  Normal affect   ASSESSMENT:    1. Sinus node dysfunction (HCC)   2. Pacemaker    PLAN:    In order of problems listed above:  1. SND s/p PPM implant Device functioning well.   Plan for in person follow up in 1 year or sooner prn.   Medication Adjustments/Labs and Tests Ordered: Current medicines are reviewed at length with the patient today.  Concerns regarding medicines are outlined above.  Orders Placed This Encounter  Procedures  . EKG 12-Lead   No orders of the defined types were placed in this encounter.    Signed, Steffanie Dunn, MD, [facility]  01/07/2021 9:05 PM    Electrophysiology [facility]

## 2021-01-07 NOTE — Patient Instructions (Signed)
Medication Instructions:  Your physician recommends that you continue on your current medications as directed. Please refer to the Current Medication list given to you today. *If you need a refill on your cardiac medications before your next appointment, please call your pharmacy*  Lab Work: None ordered. If you have labs (blood work) drawn today and your tests are completely normal, you will receive your results only by: Marland Kitchen MyChart Message (if you have MyChart) OR . A paper copy in the mail If you have any lab test that is abnormal or we need to change your treatment, we will call you to review the results.  Testing/Procedures: None ordered.  Follow-Up: At [facility], you and your health needs are our priority.  As part of our continuing mission to provide you with exceptional heart care, we have created designated Provider Care Teams.  These Care Teams include your primary Cardiologist (physician) and Advanced Practice Providers (APPs -  Physician Assistants and Nurse Practitioners) who all work together to provide you with the care you need, when you need it.  Your next appointment:   Your physician wants you to follow-up in: one year with Dr. [name].   You will receive a reminder letter in the mail two months in advance. If you don't receive a letter, please call our office to schedule the follow-up appointment.  Remote monitoring is used to monitor your Pacemaker from home. This monitoring reduces the number of office visits required to check your device to one time per year. It allows [place] to keep an eye on the functioning of your device to ensure it is working properly. You are scheduled for a device check from home on 04/08/2021. You may send your transmission at any time that day. If you have a wireless device, the transmission will be sent automatically. After your physician reviews your transmission, you will receive a postcard with your next transmission date.

## 2021-01-08 ENCOUNTER — Encounter: Payer: Self-pay | Admitting: Orthopedic Surgery

## 2021-01-08 ENCOUNTER — Telehealth: Payer: Self-pay | Admitting: Cardiology

## 2021-01-08 ENCOUNTER — Ambulatory Visit: Payer: No Typology Code available for payment source | Admitting: Orthopedic Surgery

## 2021-01-08 ENCOUNTER — Telehealth: Payer: Self-pay

## 2021-01-08 NOTE — Telephone Encounter (Signed)
Per 3-2 22 Patient check out patient should have remote check in June .      Please route to Device Clinic Pool

## 2021-01-08 NOTE — Telephone Encounter (Signed)
Called patients previous facility in [place] patient patient to be transferred into [facility]. No answer, LMOVM.

## 2021-01-14 ENCOUNTER — Other Ambulatory Visit: Payer: Self-pay | Admitting: *Deleted

## 2021-01-14 DIAGNOSIS — M0579 Rheumatoid arthritis with rheumatoid factor of multiple sites without organ or systems involvement: Secondary | ICD-10-CM

## 2021-01-15 NOTE — Telephone Encounter (Signed)
Patient has been transferred into [facility].

## 2021-01-19 ENCOUNTER — Encounter (INDEPENDENT_AMBULATORY_CARE_PROVIDER_SITE_OTHER): Payer: Self-pay | Admitting: Internal Medicine

## 2021-01-19 ENCOUNTER — Other Ambulatory Visit: Payer: Self-pay

## 2021-01-19 ENCOUNTER — Ambulatory Visit (INDEPENDENT_AMBULATORY_CARE_PROVIDER_SITE_OTHER): Payer: No Typology Code available for payment source | Admitting: Internal Medicine

## 2021-01-19 VITALS — BP 104/68 | HR 95 | Temp 97.3°F | Ht 60.25 in | Wt 193.6 lb

## 2021-01-19 DIAGNOSIS — F419 Anxiety disorder, unspecified: Secondary | ICD-10-CM

## 2021-01-19 DIAGNOSIS — E669 Obesity, unspecified: Secondary | ICD-10-CM

## 2021-01-19 DIAGNOSIS — R7303 Prediabetes: Secondary | ICD-10-CM | POA: Diagnosis not present

## 2021-01-19 DIAGNOSIS — N950 Postmenopausal bleeding: Secondary | ICD-10-CM

## 2021-01-19 DIAGNOSIS — E559 Vitamin D deficiency, unspecified: Secondary | ICD-10-CM

## 2021-01-19 DIAGNOSIS — N951 Menopausal and female climacteric states: Secondary | ICD-10-CM

## 2021-01-19 DIAGNOSIS — G4700 Insomnia, unspecified: Secondary | ICD-10-CM

## 2021-01-19 MED ORDER — CLONAZEPAM 1 MG PO TABS: 1.0000 mg | ORAL_TABLET | Freq: Three times a day (TID) | ORAL | 0 refills | Status: DC | PRN

## 2021-01-19 MED ORDER — PROGESTERONE 200 MG PO CAPS: 200.0000 mg | ORAL_CAPSULE | Freq: Every day | ORAL | 3 refills | Status: DC

## 2021-01-19 MED ORDER — NP THYROID 60 MG PO TABS: 60.0000 mg | ORAL_TABLET | Freq: Every day | ORAL | 3 refills | Status: DC

## 2021-01-19 NOTE — Progress Notes (Signed)
Metrics: Intervention Frequency ACO  Documented Smoking Status Yearly  Screened one or more times in 24 months  Cessation Counseling or  Active cessation medication Past 24 months  Past 24 months   Guideline developer: UpToDate (See UpToDate for funding source) Date Released: 2014       Wellness Office Visit  Subjective:  Patient ID: Diana Giles, female    DOB: 1971/06/19  Age: 50 y.o. MRN: [id number]  CC: Postmenopausal bleeding. HPI  This lady comes in with 1 episode of postmenopausal bleeding.  She last had a regular menstrual cycle 2 years ago.  She clearly has been in the menopause with symptoms of hot flashes, night sweats and difficulty with insomnia. She is also prediabetic now. She has symptoms of thyroid hypofunction with decreased energy, difficulty losing weight, hair loss, dry skin, tendency towards constipation and mental fog. Past Medical History:  Diagnosis Date  . Anxiety   . Avascular necrosis of hip (HCC)   . Bunion of left foot   . COVID-19   . DDD (degenerative disc disease), lumbar   . Rheumatoid arthritis (HCC)   . Sinus node dysfunction (HCC)   . Thrombocytosis    Past Surgical History:  Procedure Laterality Date  . GASTRIC BYPASS  2010   285lbs at highest weight  . HYSTEROSCOPY WITH D & C  08/22/2019  . OTHER SURGICAL HISTORY  08/2019   Bilateral fallopian tube removal  . PACEMAKER PLACEMENT  08/06/2020  . REVISION TOTAL KNEE ARTHROPLASTY Right 06/01/2019  . SHOULDER ARTHROSCOPY Left 2018     Family History  Problem Relation Age of Onset  . Hypertension Mother   . Stroke Mother   . Diabetes Father   . Heart disease Father   . Hyperthyroidism Sister   . COPD Sister   . Obesity Brother   . Hypertension Brother   . Anxiety disorder Brother   . Obesity Brother   . Diabetes Brother   . Asthma Brother   . 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   Social History   Tobacco Use  . Smoking status: Never Smoker  . Smokeless tobacco: Never Used  Substance Use Topics  . Alcohol use: Not Currently    Current Meds  Medication Sig  . acetaminophen (TYLENOL) 500 MG tablet Take 500 mg by mouth every 6 (six) hours as needed.  Marland Kitchen amphetamine-dextroamphetamine (ADDERALL XR) 20 MG 24 hr capsule Take 20 mg by mouth daily as needed.  . Cholecalciferol (VITAMIN D3) 25 MCG (1000 UT) CAPS Take 1 capsule by mouth daily at 12 noon.  . DULoxetine (CYMBALTA) 60 MG capsule Take 60 mg by mouth 2 (two) times daily.  Marland Kitchen EPINEPHrine 0.3 mg/0.3 mL IJ SOAJ injection Inject 0.3 mg into the muscle as needed for anaphylaxis.  . hydrOXYzine (VISTARIL) 25 MG capsule Take 25 mg by mouth 3 (three) times daily as needed.  Marland Kitchen ibuprofen (ADVIL) 200 MG tablet Take 400 mg by mouth every 6 (six) hours as needed.  . NP THYROID 60 MG tablet Take 1 tablet (60 mg total) by mouth daily before breakfast.  . progesterone (PROMETRIUM) 200 MG capsule Take 1 capsule (200 mg total) by mouth daily.  . vitamin B-12 (CYANOCOBALAMIN) 500 MCG tablet Take 500 mcg by mouth daily.  . [DISCONTINUED] Cholecalciferol 1.25 MG (50000 UT) capsule Take 50,000 Units by mouth daily.  . [DISCONTINUED] clonazePAM (KLONOPIN) 1 MG  tablet Take 1 tablet (1 mg total) by mouth 3 (three) times daily as needed for anxiety.     Flowsheet Row Office Visit from 12/17/2020 in [facility]  PHQ-9 Total Score 12      Objective:   Today's Vitals: BP 104/68   Pulse 95   Temp (!) 97.3 F (36.3 C) (Temporal)   Ht 5' 0.25" (1.53 m)   Wt 193 lb 9.6 oz (87.8 kg)   SpO2 96%   BMI 37.50 kg/m  Vitals with BMI 01/19/2021 01/07/2021 12/17/2020  Height 5' 0.25" 5' 0.25" 5' 0.25"  Weight 193 lbs 10 oz 192 lbs 190 lbs  BMI 37.51 37.2 36.82  Systolic 104 104 -  Diastolic 68 78 -  Pulse 95 67 86     Physical Exam   She remains obese.  She has not lost any weight.    Assessment    1. Prediabetes   2. Obesity (BMI 30-39.9)   3. Postmenopausal bleeding   4. Vitamin D insufficiency   5. Insomnia, unspecified type   6. Hot flashes due to menopause   7. Anxiety       Tests ordered Orders Placed This Encounter  Procedures  . DHEA-sulfate  . Estradiol  . Progesterone  . Testos,Total,Free and SHBG (Female)  . Ambulatory referral to Obstetrics / Gynecology     Plan: 1. We will get her seen by gynecology for her postmenopausal bleeding.  She will likely need pelvic ultrasound to evaluate the uterus.  In the meantime, I am going to start on progesterone for its health benefits and also symptoms of menopause. 2. She also has symptoms of thyroid hypofunction and after shared decision making, we will start her on desiccated NP thyroid, off label for symptoms of thyroid hypofunction.  Her free T3 levels were only 2.9. 3. Check hormones including DHEA, estradiol, progesterone and testosterone. 4. I have refilled her clonazepam. 5. I also discussed intermittent fasting and more detail today and the rationale for it.  She will try to do this.  I have given her diet sheet. 6. Follow-up with me in about 4 to 6 weeks time.  Spent 45 minutes with the patient today discussing all of the above.   Meds ordered this encounter  Medications  . progesterone (PROMETRIUM) 200 MG capsule    Sig: Take 1 capsule (200 mg total) by mouth daily.    Dispense:  30 capsule    Refill:  3  . NP THYROID 60 MG tablet    Sig: Take 1 tablet (60 mg total) by mouth daily before breakfast.    Dispense:  30 tablet    Refill:  3  . clonazePAM (KLONOPIN) 1 MG tablet    Sig: Take 1 tablet (1 mg total) by mouth 3 (three) times daily as needed for anxiety.    Dispense:  30 tablet    Refill:  0    Alzada Brazee Normajean Glasgow, MD

## 2021-01-19 NOTE — Patient Instructions (Signed)
[facility] Dietary Recommendations for Weight Loss What to Avoid . Avoid added sugars o Often added sugar can be found in processed foods such as many condiments, dry cereals, cakes, cookies, chips, crisps, crackers, candies, sweetened drinks, etc.  o Read labels and AVOID/DECREASE use of foods with the following in their ingredient list: Sugar, fructose, high fructose corn syrup, sucrose, glucose, maltose, dextrose, molasses, cane sugar, brown sugar, any type of syrup, agave nectar, etc.   . Avoid snacking in between meals . Avoid foods made with flour o If you are going to eat food made with flour, choose those made with whole-grains; and, minimize your consumption as much as is tolerable . Avoid processed foods o These foods are generally stocked in the middle of the grocery store. Focus on shopping on the perimeter of the grocery.  . Avoid Meat  o We recommend following a plant-based diet at [facility]. Thus, we recommend avoiding meat as a general rule. Consider eating beans, legumes, eggs, and/or dairy products for regular protein sources o If you plan on eating meat limit to 4 ounces of meat at a time and choose lean options such as Fish, chicken, turkey. Avoid red meat intake such as pork and/or steak What to Include . Vegetables o GREEN LEAFY VEGETABLES: Kale, spinach, mustard greens, collard greens, cabbage, broccoli, etc. o OTHER: Asparagus, cauliflower, eggplant, carrots, peas, Brussel sprouts, tomatoes, bell peppers, zucchini, beets, cucumbers, etc. . Grains, seeds, and legumes o Beans: kidney beans, black eyed peas, garbanzo beans, black beans, pinto beans, etc. o Whole, unrefined grains: brown rice, barley, bulgur, oatmeal, etc. . Healthy fats  o Avoid highly processed fats such as vegetable oil o Examples of healthy fats: avocado, olives, virgin olive oil, dark chocolate (?72% Cocoa), nuts (peanuts, almonds, walnuts, cashews, pecans, etc.) . None to Low  Intake of Animal Sources of Protein o Meat sources: chicken, turkey, salmon, tuna. Limit to 4 ounces of meat at one time. o Consider limiting dairy sources, but when choosing dairy focus on: PLAIN Greek yogurt, cottage cheese, high-protein milk . Fruit o Choose berries  When to Eat . Intermittent Fasting: o Choosing not to eat for a specific time period, but DO FOCUS ON HYDRATION when fasting o Multiple Techniques: - Time Restricted Eating: eat 3 meals in a day, each meal lasting no more than 60 minutes, no snacks between meals - 16-18 hour fast: fast for 16 to 18 hours up to 7 days a week. Often suggested to start with 2-3 nonconsecutive days per week.  . Remember the time you sleep is counted as fasting.  . Examples of eating schedule: Fast from 7:00pm-11:00am. Eat between 11:00am-7:00pm.  - 24-hour fast: fast for 24 hours up to every other day. Often suggested to start with 1 day per week . Remember the time you sleep is counted as fasting . Examples of eating schedule:  o Eating day: eat 2-3 meals on your eating day. If doing 2 meals, each meal should last no more than 90 minutes. If doing 3 meals, each meal should last no more than 60 minutes. Finish last meal by 7:00pm. o Fasting day: Fast until 7:00pm.  o IF YOU FEEL UNWELL FOR ANY REASON/IN ANY WAY WHEN FASTING, STOP FASTING BY EATING A NUTRITIOUS SNACK OR LIGHT MEAL o ALWAYS FOCUS ON HYDRATION DURING FASTS - Acceptable Hydration sources: water, broths, tea/coffee (black tea/coffee is best but using a small amount of whole-fat dairy products in coffee/tea is acceptable).  -   Poor Hydration Sources: anything with sugar or artificial sweeteners added to it  These recommendations have been developed for patients that are actively receiving medical care from either Dr. [facility] or Sarah Gray, DNP, NP-C at [facility]. These recommendations are developed for patients with specific medical conditions and are not meant to be  distributed or used by others that are not actively receiving care from either provider listed above at [facility]. It is not appropriate to participate in the above eating plans without proper medical supervision.   Reference: Fung, J. The obesity code. [place]: [place]; 2016.   

## 2021-01-21 ENCOUNTER — Other Ambulatory Visit (INDEPENDENT_AMBULATORY_CARE_PROVIDER_SITE_OTHER): Payer: Self-pay | Admitting: Internal Medicine

## 2021-01-21 MED ORDER — NP THYROID 60 MG PO TABS: 60.0000 mg | ORAL_TABLET | Freq: Every day | ORAL | 3 refills | Status: DC

## 2021-01-26 LAB — PROGESTERONE: Progesterone: 3.8 ng/mL

## 2021-01-26 LAB — TESTOS,TOTAL,FREE AND SHBG (FEMALE)
Free Testosterone: 1.9 pg/mL (ref 0.1–6.4)
Sex Hormone Binding: 107 nmol/L (ref 17–124)
Testosterone, Total, LC-MS-MS: 27 ng/dL (ref 2–45)

## 2021-01-26 LAB — DHEA-SULFATE: DHEA-SO4: 41 ug/dL (ref 5–167)

## 2021-01-26 LAB — ESTRADIOL: Estradiol: 25 pg/mL

## 2021-01-29 ENCOUNTER — Ambulatory Visit: Payer: No Typology Code available for payment source | Admitting: Gastroenterology

## 2021-01-29 ENCOUNTER — Encounter: Payer: No Typology Code available for payment source | Admitting: Obstetrics & Gynecology

## 2021-02-03 ENCOUNTER — Telehealth (INDEPENDENT_AMBULATORY_CARE_PROVIDER_SITE_OTHER): Payer: Self-pay

## 2021-02-12 ENCOUNTER — Other Ambulatory Visit (INDEPENDENT_AMBULATORY_CARE_PROVIDER_SITE_OTHER): Payer: Self-pay | Admitting: Internal Medicine

## 2021-02-12 ENCOUNTER — Ambulatory Visit (INDEPENDENT_AMBULATORY_CARE_PROVIDER_SITE_OTHER): Payer: No Typology Code available for payment source | Admitting: Nurse Practitioner

## 2021-02-12 ENCOUNTER — Encounter (INDEPENDENT_AMBULATORY_CARE_PROVIDER_SITE_OTHER): Payer: Self-pay | Admitting: Internal Medicine

## 2021-02-12 DIAGNOSIS — F419 Anxiety disorder, unspecified: Secondary | ICD-10-CM

## 2021-02-12 MED ORDER — CLONAZEPAM 1 MG PO TABS
1.0000 mg | ORAL_TABLET | Freq: Three times a day (TID) | ORAL | 3 refills | Status: DC | PRN
Start: 2021-02-12 — End: 2021-05-27

## 2021-02-19 ENCOUNTER — Ambulatory Visit (INDEPENDENT_AMBULATORY_CARE_PROVIDER_SITE_OTHER): Payer: No Typology Code available for payment source | Admitting: Nurse Practitioner

## 2021-02-19 ENCOUNTER — Encounter (INDEPENDENT_AMBULATORY_CARE_PROVIDER_SITE_OTHER): Payer: Self-pay | Admitting: Nurse Practitioner

## 2021-02-19 ENCOUNTER — Telehealth (INDEPENDENT_AMBULATORY_CARE_PROVIDER_SITE_OTHER): Payer: Self-pay | Admitting: Nurse Practitioner

## 2021-02-19 ENCOUNTER — Other Ambulatory Visit: Payer: Self-pay

## 2021-02-19 VITALS — BP 128/78 | HR 72 | Temp 96.4°F | Resp 18 | Ht 60.0 in | Wt 197.2 lb

## 2021-02-19 DIAGNOSIS — G8929 Other chronic pain: Secondary | ICD-10-CM

## 2021-02-19 DIAGNOSIS — M79672 Pain in left foot: Secondary | ICD-10-CM | POA: Diagnosis not present

## 2021-02-19 DIAGNOSIS — M069 Rheumatoid arthritis, unspecified: Secondary | ICD-10-CM | POA: Diagnosis not present

## 2021-02-19 DIAGNOSIS — M25561 Pain in right knee: Secondary | ICD-10-CM

## 2021-02-19 DIAGNOSIS — M25551 Pain in right hip: Secondary | ICD-10-CM

## 2021-02-19 MED ORDER — PREDNISONE 20 MG PO TABS: 40.0000 mg | ORAL_TABLET | Freq: Every day | ORAL | 0 refills | Status: DC

## 2021-02-19 NOTE — Progress Notes (Signed)
Subjective:  Patient ID: Diana Giles, female    DOB: 05-20-1971  Age: 50 y.o. MRN: [id number]  CC:  Chief Complaint  Patient presents with  . Foot Pain    Left foot;   . Hip Pain    Rt hip  . Knee Pain    Rt knee      HPI  This patient arrives today for an acute visit for the above.   She is been experiencing increasing pain in her left foot, right hip and right knee.  She was going to see an orthopedic doctor in the area, but after he reviewed her records to recommend she be seen by a specialist in [place].  She is asking for referral today.  She tells me she has been using plantar fasciitis socks on her left foot recently but has been noticing increased pain with ambulation since using the socks.  She does have a history of stress fractures that foot and is concerned she may have 1 of those now.  She takes ibuprofen Tylenol as needed for pain.  She does have a history of rheumatoid arthritis and is awaiting to establish care with a rheumatologist in the area.  She tells me she has an appointment coming up next month.   Past Medical History:  Diagnosis Date  . Anxiety   . Avascular necrosis of hip (HCC)   . Bunion of left foot   . COVID-19   . DDD (degenerative disc disease), lumbar   . Rheumatoid arthritis (HCC)   . Sinus node dysfunction (HCC)   . Thrombocytosis       Family History  Problem Relation Age of Onset  . Hypertension Mother   . Stroke Mother   . Diabetes Father   . Heart disease Father   . Hyperthyroidism Sister   . COPD Sister   . Obesity Brother   . Hypertension Brother   . Anxiety disorder Brother   . Obesity Brother   . Diabetes Brother   . Asthma Brother   . 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   Social History   Tobacco Use  . Smoking status: Never Smoker  . Smokeless tobacco: Never Used  Substance Use Topics  . Alcohol  use: Not Currently     Current Meds  Medication Sig  . acetaminophen (TYLENOL) 500 MG tablet Take 500 mg by mouth every 6 (six) hours as needed.  Marland Kitchen amphetamine-dextroamphetamine (ADDERALL XR) 20 MG 24 hr capsule Take 20 mg by mouth daily as needed.  . Cholecalciferol (VITAMIN D3) 25 MCG (1000 UT) CAPS Take 1 capsule by mouth daily at 12 noon.  . clonazePAM (KLONOPIN) 1 MG tablet Take 1 tablet (1 mg total) by mouth 3 (three) times daily as needed for anxiety.  . DULoxetine (CYMBALTA) 60 MG capsule Take 60 mg by mouth 2 (two) times daily.  Marland Kitchen EPINEPHrine 0.3 mg/0.3 mL IJ SOAJ injection Inject 0.3 mg into the muscle as needed for anaphylaxis.  Marland Kitchen ibuprofen (ADVIL) 200 MG tablet Take 400 mg by mouth every 6 (six) hours as needed.  . NP THYROID 60 MG tablet Take 1 tablet (60 mg total) by mouth daily before breakfast.  . predniSONE (DELTASONE) 20 MG tablet Take 2 tablets (40 mg total) by mouth daily with breakfast.  . progesterone (PROMETRIUM) 200 MG capsule Take 1 capsule (200 mg total) by mouth daily.  Marland Kitchen  vitamin B-12 (CYANOCOBALAMIN) 500 MCG tablet Take 500 mcg by mouth daily.    ROS:  See HPI   Objective:   Today's Vitals: BP 128/78 (BP Location: Right Arm, Patient Position: Sitting, Cuff Size: Normal)   Pulse 72   Temp (!) 96.4 F (35.8 C) (Temporal)   Resp 18   Ht 5' (1.524 m)   Wt 197 lb 3.2 oz (89.4 kg)   SpO2 98%   BMI 38.51 kg/m  Vitals with BMI 02/19/2021 01/19/2021 01/07/2021  Height 5\' 0"  5' 0.25" 5' 0.25"  Weight 197 lbs 3 oz 193 lbs 10 oz 192 lbs  BMI 38.51 37.51 37.2  Systolic 128 104  Diastolic 78 68 78  Pulse 72 95 67     Physical Exam Vitals reviewed.  Constitutional:      General: She is not in acute distress.    Appearance: Normal appearance.  HENT:     Head: Normocephalic and atraumatic.  Neck:     Vascular: No carotid bruit.  Cardiovascular:     Rate and Rhythm: Normal rate and regular rhythm.     Pulses: Normal pulses.     Heart sounds: Normal  heart sounds.  Pulmonary:     Effort: Pulmonary effort is normal.     Breath sounds: Normal breath sounds.  Musculoskeletal:     Right knee: Swelling and crepitus present. No deformity.     Left knee: Crepitus present. No swelling.     Left foot: Normal range of motion. Bunion present. No swelling. Normal pulse.  Skin:    General: Skin is warm and dry.  Neurological:     General: No focal deficit present.     Mental Status: She is alert and oriented to person, place, and time.  Psychiatric:        Mood and Affect: Mood normal.        Behavior: Behavior normal.        Judgment: Judgment normal.          Assessment and Plan   1. Chronic pain of right knee   2. Left foot pain   3. Right hip pain   4. Rheumatoid arthritis, involving unspecified site, unspecified whether rheumatoid factor present (HCC)      Plan: 1.-4.  We will refer her to orthopedic in [place].  We will prescribe her a short course of prednisone to see if she can get some pain relief.  I recommended she avoid taking ibuprofen while taking the prednisone.  We also discussed side effects of prednisone and that if she is not able to tolerate them to stop the medication.  I also recommend she take it with some food to protect her stomach.  She will follow up with rheumatology as scheduled.  I will send her for x-rays of her left foot and right knee.  Further recommendations be made based upon those results.   Tests ordered Orders Placed This Encounter  Procedures  . DG Foot Complete Left  . DG Knee 1-2 Views Right  . Ambulatory referral to Orthopedic Surgery      Meds ordered this encounter  Medications  . predniSONE (DELTASONE) 20 MG tablet    Sig: Take 2 tablets (40 mg total) by mouth daily with breakfast.    Dispense:  10 tablet    Refill:  0    Order Specific Question:   Supervising Provider    Answer:   [place] [[id number]]    Patient to follow-up as  scheduled or sooner as  needed.  [name], NP

## 2021-02-19 NOTE — Telephone Encounter (Signed)
Will work on the ortho referral to [facility] area. Also the referral for the psych was sent to [facility] . She has to contact them so they can finish process. They have not call/faxed me but no return call. Will work on this one as well on next business day.

## 2021-02-19 NOTE — Telephone Encounter (Signed)
Referral to orthopedic surgery ordered today, please send her to a location in [place].  Will you also look into referral to psych to see why she hasn't heard from them yet?

## 2021-02-20 ENCOUNTER — Ambulatory Visit
Admission: RE | Admit: 2021-02-20 | Discharge: 2021-02-20 | Disposition: A | Discharge status: Home / Self Care | Payer: BLUE CROSS/BLUE SHIELD | Source: Ambulatory Visit | Attending: Nurse Practitioner | Admitting: Nurse Practitioner

## 2021-02-20 DIAGNOSIS — M25561 Pain in right knee: Secondary | ICD-10-CM | POA: Insufficient documentation

## 2021-02-20 DIAGNOSIS — G8929 Other chronic pain: Secondary | ICD-10-CM | POA: Insufficient documentation

## 2021-02-20 DIAGNOSIS — M79672 Pain in left foot: Secondary | ICD-10-CM | POA: Diagnosis present

## 2021-02-26 ENCOUNTER — Other Ambulatory Visit (INDEPENDENT_AMBULATORY_CARE_PROVIDER_SITE_OTHER): Payer: Self-pay | Admitting: Nurse Practitioner

## 2021-02-26 ENCOUNTER — Encounter: Payer: Self-pay | Admitting: Gastroenterology

## 2021-02-26 DIAGNOSIS — M79672 Pain in left foot: Secondary | ICD-10-CM

## 2021-02-26 MED ORDER — TRAMADOL HCL 50 MG PO TABS: 50.0000 mg | ORAL_TABLET | Freq: Two times a day (BID) | ORAL | 0 refills | Status: AC | PRN

## 2021-02-26 NOTE — Progress Notes (Deleted)
Referring Provider: [name], NP Primary Care Physician:  Wilson Singer, MD Primary Gastroenterologist:  Dr. Bonnetta Barry chief complaint on file.   HPI:   Diana Giles is a 50 y.o. female presenting today at the request of [name], NP for colon cancer screening.   Past Medical History:  Diagnosis Date  . Anxiety   . Avascular necrosis of hip (HCC)   . Bunion of left foot   . COVID-19   . DDD (degenerative disc disease), lumbar   . Rheumatoid arthritis (HCC)   . Sinus node dysfunction (HCC)   . Thrombocytosis     Past Surgical History:  Procedure Laterality Date  . GASTRIC BYPASS  2010   285lbs at highest weight  . HYSTEROSCOPY WITH D & C  08/22/2019  . OTHER SURGICAL HISTORY  08/2019   Bilateral fallopian tube removal  . PACEMAKER PLACEMENT  08/06/2020  . REVISION TOTAL KNEE ARTHROPLASTY Right 06/01/2019  . SHOULDER ARTHROSCOPY Left 2018    Current Outpatient Medications  Medication Sig Dispense Refill  . acetaminophen (TYLENOL) 500 MG tablet Take 500 mg by mouth every 6 (six) hours as needed.    Marland Kitchen amphetamine-dextroamphetamine (ADDERALL XR) 20 MG 24 hr capsule Take 20 mg by mouth daily as needed.    . Cholecalciferol (VITAMIN D3) 25 MCG (1000 UT) CAPS Take 1 capsule by mouth daily at 12 noon.    . clonazePAM (KLONOPIN) 1 MG tablet Take 1 tablet (1 mg total) by mouth 3 (three) times daily as needed for anxiety. 30 tablet 3  . DULoxetine (CYMBALTA) 60 MG capsule Take 60 mg by mouth 2 (two) times daily.    Marland Kitchen EPINEPHrine 0.3 mg/0.3 mL IJ SOAJ injection Inject 0.3 mg into the muscle as needed for anaphylaxis.    Marland Kitchen ibuprofen (ADVIL) 200 MG tablet Take 400 mg by mouth every 6 (six) hours as needed.    . NP THYROID 60 MG tablet Take 1 tablet (60 mg total) by mouth daily before breakfast. 30 tablet 3  . predniSONE (DELTASONE) 20 MG tablet Take 2 tablets (40 mg total) by mouth daily with breakfast. 10 tablet 0  . progesterone (PROMETRIUM) 200 MG capsule Take 1  capsule (200 mg total) by mouth daily. 30 capsule 3  . traMADol (ULTRAM) 50 MG tablet Take 1 tablet (50 mg total) by mouth 2 (two) times daily as needed for up to 5 days. 10 tablet 0  . vitamin B-12 (CYANOCOBALAMIN) 500 MCG tablet Take 500 mcg by mouth daily.     No current facility-administered medications for this visit.    Allergies as of 02/27/2021 - Review Complete 01/19/2021  Allergen Reaction Noted  . Nickel  10/14/2020  . Penicillins  10/14/2020  . Shellfish allergy  10/14/2020  . Sulfa antibiotics Rash 11/13/2020    Family History  Problem Relation Age of Onset  . Hypertension Mother   . Stroke Mother   . Diabetes Father   . Heart disease Father   . Hyperthyroidism Sister   . COPD Sister   . Obesity Brother   . Hypertension Brother   . Anxiety disorder Brother   . Obesity Brother   . Diabetes Brother   . Asthma Brother   . Asthma Brother     Social History   Socioeconomic History  . Marital status: Legally Separated    Spouse name: Not on file  . Number of children: Not on file  . Years of education: Not on file  .  Highest education level: Not on file  Occupational History  . Not on file  Tobacco Use  . Smoking status: Never Smoker  . Smokeless tobacco: Never Used  Vaping Use  . Vaping Use: Never used  Substance and Sexual Activity  . Alcohol use: Not Currently  . Drug use: Never  . Sexual activity: Not Currently  Other Topics Concern  . 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   Social Determinants of Health   Financial Resource Strain: Not on file  Food Insecurity: Not on file  Transportation Needs: Not on file  Physical Activity: Not on file  Stress: Not on file  Social Connections: Not on file  Intimate Partner Violence: Not on file    Review of Systems: Gen: Denies any fever, chills, fatigue, weight loss, lack of appetite.  CV: Denies chest pain, heart palpitations,  peripheral edema, syncope.  Resp: Denies shortness of breath at rest or with exertion. Denies wheezing or cough.  GI: Denies dysphagia or odynophagia. Denies jaundice, hematemesis, fecal incontinence. GU : Denies urinary burning, urinary frequency, urinary hesitancy MS: Denies joint pain, muscle weakness, cramps, or limitation of movement.  Derm: Denies rash, itching, dry skin Psych: Denies depression, anxiety, memory loss, and confusion Heme: Denies bruising, bleeding, and enlarged lymph nodes.  Physical Exam: There were no vitals taken for this visit. General:   Alert and oriented. Pleasant and cooperative. Well-nourished and well-developed.  Head:  Normocephalic and atraumatic. Eyes:  Without icterus, sclera clear and conjunctiva pink.  Ears:  Normal auditory acuity. Nose:  No deformity, discharge,  or lesions. Mouth:  No deformity or lesions, oral mucosa pink.  Neck:  Supple, without mass or thyromegaly. Lungs:  Clear to auscultation bilaterally. No wheezes, rales, or rhonchi. No distress.  Heart:  S1, S2 present without murmurs appreciated.  Abdomen:  +BS, soft, non-tender and non-distended. No HSM noted. No guarding or rebound. No masses appreciated.  Rectal:  Deferred  Msk:  Symmetrical without gross deformities. Normal posture. Pulses:  Normal pulses noted. Extremities:  Without clubbing or edema. Neurologic:  Alert and  oriented x4;  grossly normal neurologically. Skin:  Intact without significant lesions or rashes. Cervical Nodes:  No significant cervical adenopathy. Psych:  Alert and cooperative. Normal mood and affect.

## 2021-02-26 NOTE — Progress Notes (Signed)
I touch base with patient today regarding the x-ray results.  She tells me she is still having quite a bit of pain but the prednisone has helped a bit.  We discussed her possibly wearing her boot on her foot for the time being to see if this helps with the pain in her foot.  She also tells me she has an appoint with her rheumatologist coming up in the next couple weeks.  Because her pain is still pretty significant will prescribe her small amount of tramadol that she can take as needed for severe pain.  She was told to avoid driving or working when taking tramadol and to use Tylenol and or NSAIDs as well.  She tells me she understands.

## 2021-02-27 ENCOUNTER — Ambulatory Visit: Payer: No Typology Code available for payment source | Admitting: Gastroenterology

## 2021-02-27 ENCOUNTER — Encounter: Payer: No Typology Code available for payment source | Admitting: Obstetrics & Gynecology

## 2021-03-03 ENCOUNTER — Encounter (INDEPENDENT_AMBULATORY_CARE_PROVIDER_SITE_OTHER): Payer: Self-pay | Admitting: Internal Medicine

## 2021-03-03 ENCOUNTER — Other Ambulatory Visit: Payer: Self-pay

## 2021-03-03 ENCOUNTER — Ambulatory Visit (INDEPENDENT_AMBULATORY_CARE_PROVIDER_SITE_OTHER): Payer: No Typology Code available for payment source | Admitting: Internal Medicine

## 2021-03-03 VITALS — BP 130/86 | HR 95 | Temp 98.1°F | Resp 18 | Ht 60.0 in | Wt 194.4 lb

## 2021-03-03 DIAGNOSIS — N950 Postmenopausal bleeding: Secondary | ICD-10-CM

## 2021-03-03 DIAGNOSIS — R5381 Other malaise: Secondary | ICD-10-CM

## 2021-03-03 DIAGNOSIS — N951 Menopausal and female climacteric states: Secondary | ICD-10-CM

## 2021-03-03 DIAGNOSIS — E669 Obesity, unspecified: Secondary | ICD-10-CM

## 2021-03-03 DIAGNOSIS — R7303 Prediabetes: Secondary | ICD-10-CM

## 2021-03-03 DIAGNOSIS — E559 Vitamin D deficiency, unspecified: Secondary | ICD-10-CM

## 2021-03-03 DIAGNOSIS — R5383 Other fatigue: Secondary | ICD-10-CM

## 2021-03-03 NOTE — Progress Notes (Signed)
Metrics: Intervention Frequency ACO  Documented Smoking Status Yearly  Screened one or more times in 24 months  Cessation Counseling or  Active cessation medication Past 24 months  Past 24 months   Guideline developer: UpToDate (See UpToDate for funding source) Date Released: 2014       Wellness Office Visit  Subjective:  Patient ID: Diana Giles, female    DOB: 11-18-1970  Age: 50 y.o. MRN: [id number]  CC: This lady comes in for follow-up regarding hot flashes, postmenopausal bleeding, malaise and fatigue, vitamin D deficiency and obesity with prediabetes. HPI  She has tolerated NP thyroid for symptoms of thyroid hypofunction without any problems.  She feels that she did have more energy but not completely where she wants to be. She has also been taking DHEA 15 mg daily without noticing an great improvement yet. Progesterone at night seems to be helping her sleep better. She is due to have a OB/GYN appointment to discuss the postmenopausal bleeding soon. She has not been consistent with nutrition as we have discussed previously. Past Medical History:  Diagnosis Date  . Anxiety   . Avascular necrosis of hip (HCC)   . Bunion of left foot   . COVID-19   . DDD (degenerative disc disease), lumbar   . Rheumatoid arthritis (HCC)   . Sinus node dysfunction (HCC)    s/p pacemaker  . Thrombocytosis    Past Surgical History:  Procedure Laterality Date  . GASTRIC BYPASS  2010   285lbs at highest weight  . HYSTEROSCOPY WITH D & C  08/22/2019  . OTHER SURGICAL HISTORY  08/2019   Bilateral fallopian tube removal  . PACEMAKER PLACEMENT  08/06/2020  . REVISION TOTAL KNEE ARTHROPLASTY Right 06/01/2019  . SHOULDER ARTHROSCOPY Left 2018     Family History  Problem Relation Age of Onset  . Hypertension Mother   . Stroke Mother   . Diabetes Father   . Heart disease Father   . Hyperthyroidism Sister   . COPD Sister   . Obesity Brother   . Hypertension Brother   . Anxiety  disorder Brother   . Obesity Brother   . Diabetes Brother   . Asthma Brother   . 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   Social History   Tobacco Use  . Smoking status: Never Smoker  . Smokeless tobacco: Never Used  Substance Use Topics  . Alcohol use: Not Currently    Current Meds  Medication Sig  . acetaminophen (TYLENOL) 500 MG tablet Take 500 mg by mouth every 6 (six) hours as needed.  Marland Kitchen amphetamine-dextroamphetamine (ADDERALL XR) 20 MG 24 hr capsule Take 20 mg by mouth daily as needed.  . Cholecalciferol (VITAMIN D3) 125 MCG (5000 UT) CAPS Take 2 capsules by mouth in the morning and at bedtime. 2 in AM & 2 in PM. (10,000iu /2xday).  . clonazePAM (KLONOPIN) 1 MG tablet Take 1 tablet (1 mg total) by mouth 3 (three) times daily as needed for anxiety.  . DULoxetine (CYMBALTA) 60 MG capsule Take 60 mg by mouth 2 (two) times daily.  Marland Kitchen EPINEPHrine 0.3 mg/0.3 mL IJ SOAJ injection Inject 0.3 mg into the muscle as needed for anaphylaxis.  Marland Kitchen ibuprofen (ADVIL) 200 MG tablet Take 400 mg by mouth every 6 (six) hours as needed.  . NP THYROID 60 MG tablet Take 1 tablet (60 mg total) by mouth daily before  breakfast.  . Nutritional Supplements (DHEA) 15-50 MG CAPS Take 15 mg by mouth daily at 12 noon.  . progesterone (PROMETRIUM) 200 MG capsule Take 1 capsule (200 mg total) by mouth daily.  . traMADol (ULTRAM) 50 MG tablet Take 1 tablet (50 mg total) by mouth 2 (two) times daily as needed for up to 5 days.  . vitamin B-12 (CYANOCOBALAMIN) 500 MCG tablet Take 500 mcg by mouth daily.     Flowsheet Row Office Visit from 12/17/2020 in [facility]  PHQ-9 Total Score 12      Objective:   Today's Vitals: BP 130/86 (BP Location: Left Arm, Patient Position: Sitting, Cuff Size: Normal)   Pulse 95   Temp 98.1 F (36.7 C) (Temporal)   Resp 18   Ht 5' (1.524 m)   Wt 194 lb 6.4 oz (88.2 kg)    SpO2 97%   BMI 37.97 kg/m  Vitals with BMI 03/03/2021 02/19/2021 01/19/2021  Height 5\' 0"  5\' 0"  5' 0.25"  Weight 194 lbs 6 oz 197 lbs 3 oz 193 lbs 10 oz  BMI 37.97 38.51 37.51  Systolic 130 128  Diastolic 86 78 68  Pulse 95 72 95     Physical Exam  She remains obese and has not really lost any weight.     Assessment   1. Obesity (BMI 30-39.9)   2. Postmenopausal bleeding   3. Hot flashes due to menopause   4. Prediabetes   5. Malaise and fatigue   6. Vitamin D insufficiency       Tests ordered Orders Placed This Encounter  Procedures  . DHEA-sulfate  . T3, free  . TSH  . Progesterone  . Basic metabolic panel  . VITAMIN D 25 Hydroxy (Vit-D Deficiency, Fractures)     Plan: 1. Continue with NP thyroid and we will check levels today to see if we need to further optimize. 2. Continue with DHEA 15 mg daily and I will check levels today. 3. Continue with vitamin D3 10,000 units daily and we will check levels today. 4. Further recommendations will depend on blood results and I will see her in about 6 weeks time for follow-up   No orders of the defined types were placed in this encounter.   , MD

## 2021-03-04 ENCOUNTER — Other Ambulatory Visit (INDEPENDENT_AMBULATORY_CARE_PROVIDER_SITE_OTHER): Payer: Self-pay | Admitting: Internal Medicine

## 2021-03-04 LAB — VITAMIN D 25 HYDROXY (VIT D DEFICIENCY, FRACTURES): Vit D, 25-Hydroxy: 30 ng/mL (ref 30–100)

## 2021-03-04 LAB — BASIC METABOLIC PANEL
BUN: 19 mg/dL (ref 7–25)
CO2: 24 mmol/L (ref 20–32)
Calcium: 9.4 mg/dL (ref 8.6–10.4)
Chloride: 103 mmol/L (ref 98–110)
Creat: 0.69 mg/dL (ref 0.50–1.05)
Glucose, Bld: 85 mg/dL (ref 65–139)
Potassium: 4.4 mmol/L (ref 3.5–5.3)
Sodium: 137 mmol/L (ref 135–146)

## 2021-03-04 LAB — PROGESTERONE: Progesterone: 4.8 ng/mL

## 2021-03-04 LAB — DHEA-SULFATE: DHEA-SO4: 121 ug/dL (ref 5–167)

## 2021-03-04 LAB — TSH: TSH: 0.42 mIU/L

## 2021-03-04 LAB — T3, FREE: T3, Free: 4.3 pg/mL — ABNORMAL HIGH (ref 2.3–4.2)

## 2021-03-04 MED ORDER — PROGESTERONE 200 MG PO CAPS: 400.0000 mg | ORAL_CAPSULE | Freq: Every day | ORAL | 3 refills | Status: DC

## 2021-03-13 ENCOUNTER — Ambulatory Visit: Payer: No Typology Code available for payment source | Admitting: Obstetrics & Gynecology

## 2021-03-13 ENCOUNTER — Other Ambulatory Visit: Payer: Self-pay

## 2021-03-13 ENCOUNTER — Encounter: Payer: Self-pay | Admitting: Obstetrics & Gynecology

## 2021-03-13 ENCOUNTER — Other Ambulatory Visit (HOSPITAL_COMMUNITY)
Admission: RE | Admit: 2021-03-13 | Discharge: 2021-03-13 | Disposition: A | Discharge status: Home / Self Care | Payer: BLUE CROSS/BLUE SHIELD | Source: Ambulatory Visit | Attending: Obstetrics & Gynecology | Admitting: Obstetrics & Gynecology

## 2021-03-13 VITALS — BP 135/85 | HR 84 | Ht 60.0 in | Wt 193.0 lb

## 2021-03-13 DIAGNOSIS — N950 Postmenopausal bleeding: Secondary | ICD-10-CM | POA: Insufficient documentation

## 2021-03-13 NOTE — Progress Notes (Deleted)
Office Visit Note  Patient: Diana Giles             Date of Birth: 1971/09/20           MRN: [id number]             PCP: Wilson Singer, MD Referring: Wilson Singer, MD Visit Date: 03/27/2021 Occupation: @GUAROCC @  Subjective:  No chief complaint on file.   History of Present Illness: Diana Giles is a 50 y.o. female ***   Activities of Daily Living:  Patient reports morning stiffness for *** {minute/hour:19697}.   Patient {ACTIONS;DENIES/REPORTS:21021675::"Denies"} nocturnal pain.  Difficulty dressing/grooming: {ACTIONS;DENIES/REPORTS:21021675::"Denies"} Difficulty climbing stairs: {ACTIONS;DENIES/REPORTS:21021675::"Denies"} Difficulty getting out of chair: {ACTIONS;DENIES/REPORTS:21021675::"Denies"} Difficulty using hands for taps, buttons, cutlery, and/or writing: {ACTIONS;DENIES/REPORTS:21021675::"Denies"}  No Rheumatology ROS completed.   PMFS History:  Patient Active Problem List   Diagnosis Date Noted  . Pacemaker 01/07/2021  . Rheumatoid arthritis (HCC) 11/13/2020  . Fatigue 11/13/2020  . Class 2 obesity with body mass index (BMI) of 37.0 to 37.9 in adult 11/13/2020  . DDD (degenerative disc disease), lumbar 11/13/2020  . Avascular necrosis of bone of hip, right (HCC) 11/13/2020  . Sinus node dysfunction (HCC) 11/13/2020  . Thrombocytosis 11/13/2020  . Anxiety 11/13/2020  . Attention deficit hyperactivity disorder (ADHD) 11/13/2020    Past Medical History:  Diagnosis Date  . Anxiety   . Avascular necrosis of hip (HCC)   . Bunion of left foot   . COVID-19   . DDD (degenerative disc disease), lumbar   . Rheumatoid arthritis (HCC)   . Sinus node dysfunction (HCC)    s/p pacemaker  . Thrombocytosis     Family History  Problem Relation Age of Onset  . Hypertension Mother   . Stroke Mother   . Diabetes Father   . Heart disease Father   . Hyperthyroidism Sister   . COPD Sister   . Obesity Brother   . Hypertension Brother   . Anxiety  disorder Brother   . Obesity Brother   . Diabetes Brother   . Asthma Brother   . Asthma Brother    Past Surgical History:  Procedure Laterality Date  . GASTRIC BYPASS  2010   285lbs at highest weight  . HYSTEROSCOPY WITH D & C  08/22/2019  . OTHER SURGICAL HISTORY  08/2019   Bilateral fallopian tube removal  . PACEMAKER PLACEMENT  08/06/2020  . REVISION TOTAL KNEE ARTHROPLASTY Right 06/01/2019  . 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   Immunization History  Administered Date(s) Administered  . Influenza-Unspecified 07/31/2020  . PFIZER(Purple Top)SARS-COV-2 Vaccination 02/09/2020, 03/01/2020, 09/18/2020  . Tdap 12/17/2020  . Zoster Recombinat (Shingrix) 09/23/2020     Objective: Vital Signs: There were no vitals taken for this visit.   Physical Exam   Musculoskeletal Exam: ***  CDAI Exam: CDAI Score: -- Patient Global: --; Provider Global: -- Swollen: --; Tender: -- Joint Exam 03/27/2021   No joint exam has been documented for this visit   There is currently no information documented on the homunculus. Go to the Rheumatology activity and complete the homunculus joint exam.  Investigation: No additional findings.  Imaging: DG Knee 1-2 Views Right  Result Date: 02/21/2021 CLINICAL DATA:  Chronic right knee pain. Replacement in 2013 with revision in 2020. Swelling above knee. EXAM: RIGHT KNEE - 1-2 VIEW COMPARISON:  None. FINDINGS: The patient is status post right  knee replacement. Hardware is in good position. No evidence of hardware loosening. A small joint effusion is not excluded. No other abnormalities identified. IMPRESSION: 1. Right knee replacement with hardware as above. No definite loosening or failure. 2. There appears to be a small suprapatellar effusion. 3. No other abnormalities. Electronically Signed   By: Gerome Sam III M.D   On: 02/21/2021  12:12   DG Foot Complete Left  Result Date: 02/21/2021 CLINICAL DATA:  Left foot pain. EXAM: LEFT FOOT - COMPLETE 3+ VIEW COMPARISON:  None. FINDINGS: There is no evidence of fracture or dislocation. There is no evidence of arthropathy or other focal bone abnormality. Soft tissues are unremarkable. IMPRESSION: Negative. Electronically Signed   By: Gerome Sam III M.D   On: 02/21/2021 12:11    Recent Labs: Lab Results  Component Value Date   WBC 9.5 11/17/2020   HGB 12.2 11/17/2020   PLT 449 (H) 11/17/2020   NA 137 03/03/2021   K 4.4 03/03/2021   CL 103 03/03/2021   CO2 24 03/03/2021   GLUCOSE 85 03/03/2021   BUN 19 03/03/2021   CREATININE 0.69 03/03/2021   CALCIUM 9.4 03/03/2021    Speciality Comments: No specialty comments available.  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  No orders of the defined types were placed in this encounter.   Face-to-face time spent with patient was *** minutes. Greater than 50% of time was spent in counseling and coordination of care.  Follow-Up Instructions: No follow-ups on file.   Gearldine Bienenstock, PA-C  Note - This record has been created using Dragon software.  Chart creation errors have been sought, but may not always  have been located. Such creation errors do not reflect on  the standard of medical care.

## 2021-03-13 NOTE — Progress Notes (Unsigned)
Endometrial Biopsy Procedure Note  Pre-operative Diagnosis: Post menopausal bleeding with thickened endometrium on sonogram  Post-operative Diagnosis: same  Indications: postmenopausal bleeding  Procedure Details   Urine pregnancy test was not done.  The risks (including infection, bleeding, pain, and uterine perforation) and benefits of the procedure were explained to the patient and Written informed consent was obtained.  Antibiotic prophylaxis against endocarditis was not indicated.   The patient was placed in the dorsal lithotomy position.  Bimanual exam showed the uterus to be in the neutral position.  A Graves' speculum inserted in the vagina, and the cervix prepped with povidone iodine.  Endocervical curettage with a Kevorkian curette was not performed.   A sharp tenaculum was applied to the anterior lip of the cervix for stabilization.  A sterile uterine sound was used to sound the uterus to a depth of 6.5 cm.  A Pipelle endometrial aspirator was used to sample the endometrium.  Sample was sent for pathologic examination.  Condition: Stable  Complications: None  Plan:  The patient was advised to call for any fever or for prolonged or severe pain or bleeding. She was advised to use OTC analgesics as needed for mild to moderate pain. She was advised to avoid vaginal intercourse for 48 hours or until the bleeding has completely stopped.  Attending Physician Documentation: I was present for or performed the following: endometrial biopsy   

## 2021-03-17 ENCOUNTER — Ambulatory Visit (INDEPENDENT_AMBULATORY_CARE_PROVIDER_SITE_OTHER): Payer: No Typology Code available for payment source

## 2021-03-17 DIAGNOSIS — I495 Sick sinus syndrome: Secondary | ICD-10-CM

## 2021-03-17 LAB — CUP PACEART REMOTE DEVICE CHECK
Battery Remaining Longevity: 175 mo
Battery Voltage: 3.2 V
Brady Statistic AP VP Percent: 0.03 %
Brady Statistic AP VS Percent: 6.78 %
Brady Statistic AS VP Percent: 0.04 %
Brady Statistic AS VS Percent: 93.15 %
Brady Statistic RA Percent Paced: 6.83 %
Brady Statistic RV Percent Paced: 0.07 %
Date Time Interrogation Session: 20220509070419
Implantable Lead Implant Date: 20210928
Implantable Lead Implant Date: 20210928
Implantable Lead Location: 753859
Implantable Lead Location: 753860
Implantable Lead Model: 4076
Implantable Lead Model: 4076
Implantable Pulse Generator Implant Date: 20210928
Lead Channel Impedance Value: 304 Ohm
Lead Channel Impedance Value: 361 Ohm
Lead Channel Impedance Value: 437 Ohm
Lead Channel Impedance Value: 513 Ohm
Lead Channel Pacing Threshold Amplitude: 0.625 V
Lead Channel Pacing Threshold Amplitude: 1.25 V
Lead Channel Pacing Threshold Pulse Width: 0.4 ms
Lead Channel Pacing Threshold Pulse Width: 0.4 ms
Lead Channel Sensing Intrinsic Amplitude: 2.75 mV
Lead Channel Sensing Intrinsic Amplitude: 2.75 mV
Lead Channel Sensing Intrinsic Amplitude: 8.125 mV
Lead Channel Sensing Intrinsic Amplitude: 8.125 mV
Lead Channel Setting Pacing Amplitude: 2 V
Lead Channel Setting Pacing Amplitude: 2.5 V
Lead Channel Setting Pacing Pulse Width: 0.4 ms
Lead Channel Setting Sensing Sensitivity: 0.9 mV

## 2021-03-17 LAB — SURGICAL PATHOLOGY

## 2021-03-27 ENCOUNTER — Ambulatory Visit: Payer: No Typology Code available for payment source | Admitting: Rheumatology

## 2021-03-27 DIAGNOSIS — D75839 Thrombocytosis, unspecified: Secondary | ICD-10-CM

## 2021-03-27 DIAGNOSIS — M87051 Idiopathic aseptic necrosis of right femur: Secondary | ICD-10-CM

## 2021-03-27 DIAGNOSIS — M0579 Rheumatoid arthritis with rheumatoid factor of multiple sites without organ or systems involvement: Secondary | ICD-10-CM

## 2021-03-27 DIAGNOSIS — Z950 Presence of cardiac pacemaker: Secondary | ICD-10-CM

## 2021-03-27 DIAGNOSIS — M5136 Other intervertebral disc degeneration, lumbar region: Secondary | ICD-10-CM

## 2021-03-27 DIAGNOSIS — F419 Anxiety disorder, unspecified: Secondary | ICD-10-CM

## 2021-03-27 DIAGNOSIS — I495 Sick sinus syndrome: Secondary | ICD-10-CM

## 2021-04-09 ENCOUNTER — Ambulatory Visit (INDEPENDENT_AMBULATORY_CARE_PROVIDER_SITE_OTHER): Payer: No Typology Code available for payment source | Admitting: Internal Medicine

## 2021-04-09 ENCOUNTER — Other Ambulatory Visit: Payer: Self-pay

## 2021-04-09 ENCOUNTER — Encounter (INDEPENDENT_AMBULATORY_CARE_PROVIDER_SITE_OTHER): Payer: Self-pay | Admitting: Internal Medicine

## 2021-04-09 VITALS — BP 114/78 | HR 103 | Temp 97.2°F | Ht 60.0 in | Wt 192.0 lb

## 2021-04-09 DIAGNOSIS — R5381 Other malaise: Secondary | ICD-10-CM

## 2021-04-09 DIAGNOSIS — T148XXA Other injury of unspecified body region, initial encounter: Secondary | ICD-10-CM

## 2021-04-09 DIAGNOSIS — N951 Menopausal and female climacteric states: Secondary | ICD-10-CM | POA: Diagnosis not present

## 2021-04-09 DIAGNOSIS — R5383 Other fatigue: Secondary | ICD-10-CM

## 2021-04-09 DIAGNOSIS — R7303 Prediabetes: Secondary | ICD-10-CM | POA: Diagnosis not present

## 2021-04-09 DIAGNOSIS — N950 Postmenopausal bleeding: Secondary | ICD-10-CM | POA: Diagnosis not present

## 2021-04-09 DIAGNOSIS — E559 Vitamin D deficiency, unspecified: Secondary | ICD-10-CM

## 2021-04-09 NOTE — Progress Notes (Signed)
Remote pacemaker transmission.   

## 2021-04-09 NOTE — Progress Notes (Signed)
Metrics: Intervention Frequency ACO  Documented Smoking Status Yearly  Screened one or more times in 24 months  Cessation Counseling or  Active cessation medication Past 24 months  Past 24 months   Guideline developer: UpToDate (See UpToDate for funding source) Date Released: 2014       Wellness Office Visit  Subjective:  Patient ID: Diana Giles, female    DOB: 1971/06/03  Age: 50 y.o. MRN: [id number]  CC: This lady comes in for follow-up of menopausal symptoms but also history of postmenopausal bleeding.  HPI  She did go and see her gynecologist and had a possible uterine biopsy.  She is going to follow-up with a pelvic ultrasound and will see the gynecologist tomorrow. Since being on progesterone 400 mg daily at night, it seems that the bleeding has now stopped.  Her hot flashes have improved to some degree. She wonders if she has PCOS. She has tolerated NP thyroid without any problems. She does feel overall better and seems to have more energy.  She is trying to work on nutrition to help her lose weight. Today she did describe some  bruising on her arms and legs.  Does not know why this is occurring. She has been taking higher dose of vitamin D3 10,000 units daily. Past Medical History:  Diagnosis Date  . Anxiety   . Avascular necrosis of hip (HCC)   . Bunion of left foot   . COVID-19   . DDD (degenerative disc disease), lumbar   . Herpes simplex virus (HSV) infection   . HPV in female   . Mental disorder   . Rheumatoid arthritis (HCC)   . Sinus node dysfunction (HCC)    s/p pacemaker  . Thrombocytosis   . Vaginal Pap smear, abnormal    Past Surgical History:  Procedure Laterality Date  . GASTRIC BYPASS  2010   285lbs at highest weight  . HYSTEROSCOPY WITH D & C  08/22/2019  . OTHER SURGICAL HISTORY  08/2019   Bilateral fallopian tube removal  . PACEMAKER PLACEMENT  08/06/2020  . REVISION TOTAL KNEE ARTHROPLASTY Right 06/01/2019  . SHOULDER ARTHROSCOPY Left  2018  . TUBAL LIGATION Bilateral 08/07/2019     Family History  Problem Relation Age of Onset  . Hypertension Mother   . Stroke Mother   . Diabetes Father   . Heart disease Father   . Hyperthyroidism Sister   . COPD Sister   . Obesity Brother   . Hypertension Brother   . Anxiety disorder Brother   . Obesity Brother   . Diabetes Brother   . Asthma Brother   . Asthma Brother   . 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   Social History   Tobacco Use  . Smoking status: Never Smoker  . Smokeless tobacco: Never Used  Substance Use Topics  . Alcohol use: Not Currently    Comment: occ    Current Meds  Medication Sig  . acetaminophen (TYLENOL) 500 MG tablet Take 500 mg by mouth every 6 (six) hours as needed.  Marland Kitchen amphetamine-dextroamphetamine (ADDERALL XR) 20 MG 24 hr capsule Take 20 mg by mouth daily as needed.  . Cholecalciferol (VITAMIN D3) 125 MCG (5000 UT) CAPS Take 2 capsules by mouth in the morning and at bedtime. 2 in AM & 2 in PM. (10,000iu /2xday).  . clonazePAM (KLONOPIN) 1 MG tablet Take 1 tablet (1  mg total) by mouth 3 (three) times daily as needed for anxiety.  . DULoxetine (CYMBALTA) 60 MG capsule Take 60 mg by mouth 2 (two) times daily.  Marland Kitchen EPINEPHrine 0.3 mg/0.3 mL IJ SOAJ injection Inject 0.3 mg into the muscle as needed for anaphylaxis.  Marland Kitchen ibuprofen (ADVIL) 200 MG tablet Take 400 mg by mouth every 6 (six) hours as needed.  . NP THYROID 60 MG tablet Take 1 tablet (60 mg total) by mouth daily before breakfast.  . Nutritional Supplements (DHEA) 15-50 MG CAPS Take 30 mg by mouth daily at 12 noon.  . progesterone (PROMETRIUM) 200 MG capsule Take 2 capsules (400 mg total) by mouth daily.  . vitamin B-12 (CYANOCOBALAMIN) 500 MCG tablet Take 500 mcg by mouth daily.     Flowsheet Row Office Visit from 04/09/2021 in [facility]  PHQ-9 Total Score 0       Objective:   Today's Vitals: BP 114/78   Pulse (!) 103   Temp (!) 97.2 F (36.2 C) (Temporal)   Ht 5' (1.524 m)   Wt 192 lb (87.1 kg)   SpO2 99%   BMI 37.50 kg/m  Vitals with BMI 04/09/2021 03/13/2021 03/03/2021  Height 5\' 0"  5\' 0"  5\' 0"   Weight 192 lbs 193 lbs 194 lbs 6 oz  BMI 37.5 37.69 37.97  Systolic 114 135  Diastolic 78 85 86  Pulse 103 84 95     Physical Exam  She remains obese but has lost 2 pounds since last time I saw her.  Blood pressure is in a good range.     Assessment   1. Bruising   2. Hot flashes due to menopause   3. Postmenopausal bleeding   4. Prediabetes   5. Vitamin D deficiency disease   6. Malaise and fatigue       Tests ordered Orders Placed This Encounter  Procedures  . Hemoglobin A1c  . DHEA-sulfate  . CBC  . VITAMIN D 25 Hydroxy (Vit-D Deficiency, Fractures)  . CP4508-PT/INR AND PTT  . COMPLETE METABOLIC PANEL WITH GFR  . Follicle stimulating hormone  . Luteinizing hormone     Plan: 1. Continue with NP thyroid as she is tolerating this dose. 2. Continue to work on nutrition. 3. Continue with progesterone at night. 4. Continue on vitamin D3 10,000 units daily and we will check vitamin D levels. 5. Blood work is ordered. 6. Further recommendations will depend on blood results and I will see her in 3 months time for follow-up.   No orders of the defined types were placed in this encounter.   , MD

## 2021-04-10 ENCOUNTER — Ambulatory Visit: Payer: No Typology Code available for payment source | Admitting: Obstetrics & Gynecology

## 2021-04-10 ENCOUNTER — Ambulatory Visit (INDEPENDENT_AMBULATORY_CARE_PROVIDER_SITE_OTHER): Payer: No Typology Code available for payment source

## 2021-04-10 ENCOUNTER — Encounter: Payer: Self-pay | Admitting: Obstetrics & Gynecology

## 2021-04-10 VITALS — BP 103/73 | HR 63 | Ht 60.0 in | Wt 192.0 lb

## 2021-04-10 DIAGNOSIS — N950 Postmenopausal bleeding: Secondary | ICD-10-CM

## 2021-04-10 LAB — CP4508-PT/INR AND PTT
INR: 0.9
Prothrombin Time: 9.2 s (ref 9.0–11.5)
aPTT: 27 s (ref 23–32)

## 2021-04-10 LAB — COMPLETE METABOLIC PANEL WITH GFR
AG Ratio: 1.4 (calc) (ref 1.0–2.5)
ALT: 10 U/L (ref 6–29)
AST: 15 U/L (ref 10–35)
Albumin: 4.3 g/dL (ref 3.6–5.1)
Alkaline phosphatase (APISO): 123 U/L (ref 37–153)
BUN: 13 mg/dL (ref 7–25)
CO2: 23 mmol/L (ref 20–32)
Calcium: 9.3 mg/dL (ref 8.6–10.4)
Chloride: 105 mmol/L (ref 98–110)
Creat: 0.6 mg/dL (ref 0.50–1.05)
GFR, Est African American: 121 mL/min/{1.73_m2} (ref >=60)
GFR, Est Non African American: 105 mL/min/{1.73_m2} (ref >=60)
Globulin: 3 g/dL (calc) (ref 1.9–3.7)
Glucose, Bld: 82 mg/dL (ref 65–99)
Potassium: 4.3 mmol/L (ref 3.5–5.3)
Sodium: 137 mmol/L (ref 135–146)
Total Bilirubin: 0.7 mg/dL (ref 0.2–1.2)
Total Protein: 7.3 g/dL (ref 6.1–8.1)

## 2021-04-10 LAB — CBC
HCT: 35.1 % (ref 35.0–45.0)
Hemoglobin: 11.4 g/dL — ABNORMAL LOW (ref 11.7–15.5)
MCH: 28.6 pg (ref 27.0–33.0)
MCHC: 32.5 g/dL (ref 32.0–36.0)
MCV: 88.2 fL (ref 80.0–100.0)
MPV: 9.6 fL (ref 7.5–12.5)
Platelets: 431 10*3/uL — ABNORMAL HIGH (ref 140–400)
RBC: 3.98 10*6/uL (ref 3.80–5.10)
RDW: 14.4 % (ref 11.0–15.0)
WBC: 8 10*3/uL (ref 3.8–10.8)

## 2021-04-10 LAB — HEMOGLOBIN A1C
Hgb A1c MFr Bld: 5.6 % of total Hgb (ref <5.7)
Mean Plasma Glucose: 114 mg/dL
eAG (mmol/L): 6.3 mmol/L

## 2021-04-10 LAB — FOLLICLE STIMULATING HORMONE: FSH: 49.9 m[IU]/mL

## 2021-04-10 LAB — DHEA-SULFATE: DHEA-SO4: 301 ug/dL — ABNORMAL HIGH (ref 5–167)

## 2021-04-10 LAB — VITAMIN D 25 HYDROXY (VIT D DEFICIENCY, FRACTURES): Vit D, 25-Hydroxy: 38 ng/mL (ref 30–100)

## 2021-04-10 LAB — LUTEINIZING HORMONE: LH: 19 m[IU]/mL

## 2021-04-10 MED ORDER — PROGESTERONE 200 MG PO CAPS: 200.0000 mg | ORAL_CAPSULE | Freq: Every day | ORAL | 6 refills | Status: DC

## 2021-04-10 NOTE — Progress Notes (Signed)
PELVIC US TA/TV:heterogeneous retroverted uterus with mult fibroids,linear striation and mult small myometrial cysts,(#1) anterior intramural fibroid 2 x 1.7 x 2 cm,(#2) fundal subserosal fibroid 3.5 x 2.8 x 3.1 cm,thickened endometrium with irregular borders,EEC 8.1 mm,normal ovaries,ovaries appear mobile,no free fluid,no pain during ultrasound  Chaperone [employer]

## 2021-04-10 NOTE — Progress Notes (Signed)
Follow up appointment for results  Chief Complaint  Patient presents with  . discuss [place] results    Blood pressure 103/73, pulse 63, height 5' (1.524 m), weight 192 lb (87.1 kg).  US PELVIS TRANSVAGINAL NON-OB (TV ONLY)  Result Date: 04/10/2021  [facility] [address] [place] [address]                                                                                             GYNECOLOGIC SONOGRAM Diana Giles is a 50 y.o. [phone] No LMP recorded. Patient is postmenopausal. She is here for a pelvic sonogram for postmenopausal bleeding. Uterus                      6.1 x 4.2 x 7.4 cm, Total uterine volume 99 cc,heterogeneous retroverted uterus with mult fibroids,linear striation and mult small myometrial cysts,(#1) anterior intramural fibroid 2 x 1.7 x 2 cm,(#2) fundal subserosal fibroid 3.5 x 2.8 x 3.1 cm Endometrium          8.1 mm, symmetrical, thickened endometrium with irregular borders Right ovary             1.7 x .8 x 1.6 cm, wnl Left ovary                2.4 x 1.7 x .9 cm, wnl No free fluid Technician Comments: PELVIC US TA/TV:heterogeneous retroverted uterus with mult fibroids,linear striation and mult small myometrial cysts,(#1) anterior intramural fibroid 2 x 1.7 x 2 cm,(#2) fundal subserosal fibroid 3.5 x 2.8 x 3.1 cm,thickened endometrium with irregular borders,EEC 8.1 mm,normal ovaries,ovaries appear mobile,no free fluid,no pain during ultrasound Chaperone [address] Flora Lipps 04/10/2021 9:55 AM Clinical Impression and recommendations: I have reviewed the sonogram results above, combined with the patient's current clinical course, below are my impressions and any appropriate recommendations for management based on the sonographic findings. Uterus overall normal size with small fibroids Endometrium is slightly thickened for a post menopausal woman, endometrial biopsy is negative Ovaries are normal Lazaro Arms 04/10/2021 10:41 AM   US PELVIS  (TRANSABDOMINAL ONLY)  Result Date: 04/10/2021  [facility] [address] [place] [address]                                                                                             GYNECOLOGIC SONOGRAM Diana Giles is a 50 y.o. [id number] No LMP recorded. Patient is postmenopausal. She is here for a pelvic sonogram for postmenopausal bleeding. Uterus                      6.1 x 4.2 x 7.4 cm, Total uterine volume 99 cc,heterogeneous retroverted  uterus with mult fibroids,linear striation and mult small myometrial cysts,(#1) anterior intramural fibroid 2 x 1.7 x 2 cm,(#2) fundal subserosal fibroid 3.5 x 2.8 x 3.1 cm Endometrium          8.1 mm, symmetrical, thickened endometrium with irregular borders Right ovary             1.7 x .8 x 1.6 cm, wnl Left ovary                2.4 x 1.7 x .9 cm, wnl No free fluid Technician Comments: PELVIC US TA/TV:heterogeneous retroverted uterus with mult fibroids,linear striation and mult small myometrial cysts,(#1) anterior intramural fibroid 2 x 1.7 x 2 cm,(#2) fundal subserosal fibroid 3.5 x 2.8 x 3.1 cm,thickened endometrium with irregular borders,EEC 8.1 mm,normal ovaries,ovaries appear mobile,no free fluid,no pain during ultrasound [facility] [address] Flora Lipps 04/10/2021 9:55 AM Clinical Impression and recommendations: I have reviewed the sonogram results above, combined with the patient's current clinical course, below are my impressions and any appropriate recommendations for management based on the sonographic findings. Uterus overall normal size with small fibroids Endometrium is slightly thickened for a post menopausal woman, endometrial biopsy is negative Ovaries are normal Lazaro Arms 04/10/2021 10:41 AM    Endometrial biopsy is negative no atypia or hyperplasia No further bleeding  MEDS ordered this encounter: Meds ordered this encounter  Medications  . progesterone (PROMETRIUM) 200 MG capsule    Sig: Take 1 capsule  (200 mg total) by mouth daily. For 21 days then off for 7 days    Dispense:  21 capsule    Refill:  6    Orders for this encounter: No orders of the defined types were placed in this encounter.   Impression:   ICD-10-CM   1. PMB (postmenopausal bleeding)  N95.0    cycle with prometrium 200 on 21 off 7, repeat to evalaute endometrial stimulation     Plan: Pt will send me a MyChart message after 3 full cycles and tell me how it went with her withdrawal response  Follow Up: No follow-ups on file.     All questions were answered.  Past Medical History:  Diagnosis Date  . Anxiety   . Avascular necrosis of hip (HCC)   . Bunion of left foot   . COVID-19   . DDD (degenerative disc disease), lumbar   . Herpes simplex virus (HSV) infection   . HPV in female   . Mental disorder   . Rheumatoid arthritis (HCC)   . Sinus node dysfunction (HCC)    s/p pacemaker  . Thrombocytosis   . Vaginal Pap smear, abnormal     Past Surgical History:  Procedure Laterality Date  . GASTRIC BYPASS  2010   285lbs at highest weight  . HYSTEROSCOPY WITH D & C  08/22/2019  . OTHER SURGICAL HISTORY  08/2019   Bilateral fallopian tube removal  . PACEMAKER PLACEMENT  08/06/2020  . REVISION TOTAL KNEE ARTHROPLASTY Right 06/01/2019  . SHOULDER ARTHROSCOPY Left 2018  . TUBAL LIGATION Bilateral 08/07/2019    OB History    Gravida  2   Para  2   Term  2   Preterm      AB      Living  2     SAB      IAB      Ectopic      Multiple      Live Births  2  Allergies  Allergen Reactions  . Nickel   . Penicillins   . Shellfish Allergy   . Sulfa Antibiotics Rash    Social History   Socioeconomic History  . Marital status: Legally Separated    Spouse name: Not on file  . Number of children: 2  . Years of education: Not on file  . Highest education level: Not on file  Occupational History  . Not on file  Tobacco Use  . Smoking status: Never Smoker  .  Smokeless tobacco: Never Used  Vaping Use  . Vaping Use: Never used  Substance and Sexual Activity  . Alcohol use: Not Currently    Comment: occ  . Drug use: Never  . Sexual activity: Not Currently    Birth control/protection: Surgical    Comment: tubal  Other Topics Concern  . 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   Social Determinants of Health   Financial Resource Strain: Medium Risk  . Difficulty of Paying Living Expenses: Somewhat hard  Food Insecurity: No Food Insecurity  . Worried About Programme researcher, broadcasting/film/video in the Last Year: Never true  . Ran Out of Food in the Last Year: Never true  Transportation Needs: No Transportation Needs  . Lack of Transportation (Medical): No  . Lack of Transportation (Non-Medical): No  Physical Activity: Inactive  . Days of Exercise per Week: 0 days  . Minutes of Exercise per Session: 30 min  Stress: Stress Concern Present  . Feeling of Stress : To some extent  Social Connections: Socially Isolated  . Frequency of Communication with Friends and Family: Three times a week  . Frequency of Social Gatherings with Friends and Family: Twice a week  . Attends Religious Services: Never  . Active Member of Clubs or Organizations: No  . Attends Banker Meetings: Never  . Marital Status: Separated    Family History  Problem Relation Age of Onset  . Hypertension Mother   . Stroke Mother   . Diabetes Father   . Heart disease Father   . Hyperthyroidism Sister   . COPD Sister   . Obesity Brother   . Hypertension Brother   . Anxiety disorder Brother   . Obesity Brother   . Diabetes Brother   . Asthma Brother   . Asthma Brother   . Colon cancer Cousin

## 2021-04-24 ENCOUNTER — Ambulatory Visit: Payer: No Typology Code available for payment source | Admitting: Rheumatology

## 2021-05-08 NOTE — Progress Notes (Signed)
Office Visit Note  Patient: Diana Giles             Date of Birth: 1971/04/02           MRN: [id number]             PCP: Doree Albee, MD Referring: Doree Albee, MD Visit Date: 05/22/2021 Occupation: @GUAROCC @  Subjective:  New Patient (Initial Visit) (History of RA, patient was treated with sulfasalazine (discontinued x 2-3 years). Patient complains of generalized joint pain and stiffness. )   History of Present Illness: Diana Giles is a 50 y.o. female seen in consultation per request of her PCP.  According the patient in her mid 24s she started having aches and pains.  She states that initially her PCP is related to being overweight.  In her 51s she decided to undergo gastric bypass surgery which was in 2010.  She states at that time she was limping due to knee joint pain.  She was seen by rheumatologist who diagnosed her with rheumatoid arthritis.  She recalls having Vectra test which was in low titers.  She was placed on sulfasalazine which she continued from 2010 until 2019 until the rheumatologist retired.  She states she had occasional swelling during the duration.  She also went to her rheumatologist for a second opinion during that time and was given the diagnosis of fibromyalgia.  She has been off medications since 2019.  She had been under care of orthopedic surgeons over the years.  She has had problems with her left shoulder and had left shoulder joint debridement surgery in 2018.  She has multiple injections to her left shoulder.  She was also diagnosed with degenerative disease of lumbar spine.  She has an appointment coming up with a neurosurgeon.  She had been treated for bilateral trochanteric bursitis.  She was diagnosed with right hip AVN but did not require surgery.  She had her right total knee replacement in 2015 and revision in 2020.  She continues to have pain and discomfort in her right knee.  She also has discomfort in the left knee joint.  She states  she has been taking NSAIDs over the years.  She moved to [place] in December 2021.  She continues to have discomfort in her left shoulder, both elbows more on the right elbow, right wrist joint, both hands, trochanteric bursa bilaterally, bilateral knees, right ankle and her both feet.  She notices swelling in her right wrist joint and her knee joints.  There is no family history of rheumatoid arthritis.  She is gravida 2, para 2, miscarriages 0.  There is no history of DVTs or pulmonary embolism.  Activities of Daily Living:  Patient reports morning stiffness for 24 hours.   Patient Reports nocturnal pain.  Difficulty dressing/grooming: Reports Difficulty climbing stairs: Reports Difficulty getting out of chair: Reports Difficulty using hands for taps, buttons, cutlery, and/or writing: Reports  Review of Systems  Constitutional:  Positive for fatigue.  HENT:  Positive for mouth dryness. Negative for mouth sores and nose dryness.   Eyes:  Negative for pain, itching, visual disturbance and dryness.  Respiratory:  Positive for shortness of breath and difficulty breathing. Negative for cough and hemoptysis.   Cardiovascular:  Positive for palpitations. Negative for chest pain and swelling in legs/feet.       Anxiety related  Gastrointestinal:  Positive for abdominal pain. Negative for blood in stool, constipation and diarrhea.  Endocrine: Negative for increased urination.  Genitourinary:  Negative for painful urination.  Musculoskeletal:  Positive for joint pain, joint pain, joint swelling, myalgias, muscle weakness, morning stiffness, muscle tenderness and myalgias.  Skin:  Positive for color change. Negative for rash, redness and sensitivity to sunlight.  Allergic/Immunologic: Negative for susceptible to infections.  Neurological:  Positive for memory loss and weakness. Negative for dizziness, numbness and headaches.  Hematological:  Negative for swollen glands.   Psychiatric/Behavioral:  Positive for depressed mood and sleep disturbance. The patient is nervous/anxious.    PMFS History:  Patient Active Problem List   Diagnosis Date Noted   Pacemaker 01/07/2021   Rheumatoid arthritis (HCC) 11/13/2020   Fatigue 11/13/2020   Class 2 obesity with body mass index (BMI) of 37.0 to 37.9 in adult 11/13/2020   DDD (degenerative disc disease), lumbar 11/13/2020   Avascular necrosis of bone of hip, right (HCC) 11/13/2020   Sinus node dysfunction (HCC) 11/13/2020   Thrombocytosis 11/13/2020   Anxiety 11/13/2020   Attention deficit hyperactivity disorder (ADHD) 11/13/2020    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HCC)    Bunion of left foot    COVID-19    DDD (degenerative disc disease), lumbar    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    Mental disorder    Rheumatoid arthritis (HCC)    Sinus node dysfunction (HCC)    s/p pacemaker   Thrombocytosis    Vaginal Pap smear, abnormal     Family History  Problem Relation Age of Onset   Hypertension Mother    Stroke Mother    Diabetes Father    Heart disease Father    Hyperthyroidism Sister    COPD Sister    Obesity Brother    Hypertension Brother    Anxiety disorder Brother    Obesity Brother    Diabetes Brother    Asthma Brother    Asthma Brother    Polycystic ovary syndrome Daughter    Asthma Son    Obesity Son    Colon cancer Cousin    Past Surgical History:  Procedure Laterality Date   GASTRIC BYPASS  2010   285lbs at highest weight   HYSTEROSCOPY WITH D & C  08/22/2019   OTHER SURGICAL HISTORY  08/2019   Bilateral fallopian tube removal   PACEMAKER PLACEMENT  08/06/2020   REVISION TOTAL KNEE ARTHROPLASTY Right 06/01/2019   SHOULDER ARTHROSCOPY Left 2018   TUBAL LIGATION Bilateral 08/07/2019   Social History   Social History Narrative   Separated for 12 years,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    Immunization History  Administered Date(s) Administered   Influenza-Unspecified 07/31/2020   PFIZER(Purple Top)SARS-COV-2 Vaccination 02/09/2020, 03/01/2020, 09/18/2020   Tdap 12/17/2020   Zoster Recombinat (Shingrix) 09/23/2020     Objective: Vital Signs: BP 121/82 (BP Location: Right Arm, Patient Position: Sitting, Cuff Size: Large)   Pulse (!) 58   Ht 5' 0.5" (1.537 m)   Wt 194 lb 8 oz (88.2 kg)   BMI 37.36 kg/m    Physical Exam Vitals and nursing note reviewed.  Constitutional:      Appearance: She is well-developed.  HENT:     Head: Normocephalic and atraumatic.  Eyes:     Conjunctiva/sclera: Conjunctivae normal.  Cardiovascular:     Rate and Rhythm: Normal rate and regular rhythm.     Heart sounds: Normal heart sounds.  Pulmonary:     Effort: Pulmonary effort is normal.  Breath sounds: Normal breath sounds.  Abdominal:     General: Bowel sounds are normal.     Palpations: Abdomen is soft.  Musculoskeletal:     Cervical back: Normal range of motion.  Lymphadenopathy:     Cervical: No cervical adenopathy.  Skin:    General: Skin is warm and dry.     Capillary Refill: Capillary refill takes less than 2 seconds.  Neurological:     Mental Status: She is alert and oriented to person, place, and time.  Psychiatric:        Behavior: Behavior normal.     Musculoskeletal Exam: C-spine was in good range of motion.  She had discomfort range of motion of her left shoulder joint with abduction and internal rotation.  She had tenderness over bilateral medial epicondyle region.  She had tenderness over wrist joints but no synovitis was noted.  There was no synovitis over MCP joints.  Mild PIP and DIP prominence was noted.  Hip joints with good range of motion.  She had bilateral trochanteric bursitis.  Right knee joint is replaced.  Left knee joint had good range of motion with some warmth without effusion.  There was no tenderness over ankle joints.  Bilateral first MTP  thickening with no synovitis was noted.  CDAI Exam: CDAI Score: -- Patient Global: --; Provider Global: -- Swollen: --; Tender: -- Joint Exam 05/22/2021   No joint exam has been documented for this visit   There is currently no information documented on the homunculus. Go to the Rheumatology activity and complete the homunculus joint exam.  Investigation: No additional findings.  Imaging: No results found.  Recent Labs: Lab Results  Component Value Date   WBC 8.0 04/09/2021   HGB 11.4 (L) 04/09/2021   PLT 431 (H) 04/09/2021   NA 137 04/09/2021   K 4.3 04/09/2021   CL 105 04/09/2021   CO2 23 04/09/2021   GLUCOSE 82 04/09/2021   BUN 13 04/09/2021   CREATININE 0.60 04/09/2021   BILITOT 0.7 04/09/2021   AST 15 04/09/2021   ALT 10 04/09/2021   PROT 7.3 04/09/2021   CALCIUM 9.3 04/09/2021   GFRAA 121 04/09/2021    Speciality Comments: No specialty comments available.  Procedures:  No procedures performed Allergies: Nickel, Penicillins, Shellfish allergy, and Sulfa antibiotics   Assessment / Plan:     Visit Diagnoses: Polyarthralgia -patient was diagnosed with rheumatoid arthritis in [place].  She states she was treated with sulfasalazine for almost 10 years.  She has been off sulfasalazine for last 3 to 4 years.  She also went for a second opinion in [place] to a rheumatologist who diagnosed her with fibromyalgia syndrome.  She continues to have pain and discomfort in multiple joints.  11/17/20: ANA negative, RF 15  Chronic left shoulder pain -she has been experiencing pain in her left shoulder.  She had painful range of motion of left shoulder joint.  Plan: XR Shoulder Left.  X-ray of the shoulder joint was unremarkable.  Medial epicondylitis of elbow, right-she had tenderness over bilateral medial epicondyle region more prominent on the right side.  She works as a Charity fundraiser.    Pain in both hands -she complains of pain and discomfort in her bilateral hands.  She  notices intermittent swelling.  No synovitis was noted on my examination today.  Plan: XR Hand 2 View Right, XR Hand 2 View Left, x-ray of bilateral hands consistent with osteoarthritis.Sedimentation rate, Rheumatoid factor, Cyclic citrul peptide antibody, IgG,  14-3-3 eta Protein, HLA-B27 antigen  Avascular necrosis of bone of hip, right ([place]) -diagnosed in [place] several years ago.  She did not require surgery per patient.  She had good range of motion of her right hip joint today.  Trochanteric bursitis of both hips-she had bilateral trochanteric bursitis on examination.  IT band stretches were discussed.  Hx of total knee replacement, right - THKR 2015, revision 2020 in [place].  She continues to have discomfort in her right knee joint.  Chronic pain of left knee -she complains of left knee joint discomfort.  No warmth swelling or effusion was noted.  Plan: XR KNEE 3 VIEW LEFT.  X-ray showed mild osteoarthritis and mild chondromalacia patella.  Pain in both feet -she complains of discomfort in her bilateral feet.  Clinical findings are consistent with osteoarthritis.  No synovitis was noted.  Plan: XR Foot 2 Views Right, XR Foot 2 Views Left.  X-rays were consistent with osteoarthritis.  DDD (degenerative disc disease), lumbar-patient states she has had MRI of her lumbar spine in the past and was diagnosed with degenerative disc disease.  Myalgia -she continues to have generalized pain and myalgias.  She also has hyperalgesia and rib cage pain.  She most likely has fibromyalgia.  Plan: CK  Other fatigue -she gives history of fatigue.  She states she sleeps through the night but feels tired in the morning.  Plan: Glucose 6 phosphate dehydrogenase  Thrombocytosis-she was seen by hematologist in [place] and was told that thrombocytosis is baseline for her.  Sinus node dysfunction (HCC)  Pacemaker  History of gastric bypass - 2010  History of ADHD  History of anxiety  Orders: Orders  Placed This Encounter  Procedures   XR Hand 2 View Right   XR Hand 2 View Left   XR KNEE 3 VIEW LEFT   XR Foot 2 Views Right   XR Foot 2 Views Left   XR Shoulder Left   CK   Sedimentation rate   Rheumatoid factor   Cyclic citrul peptide antibody, IgG   14-3-3 eta Protein   Glucose 6 phosphate dehydrogenase   HLA-B27 antigen    No orders of the defined types were placed in this encounter.   Follow-Up Instructions: Return for Polyarthralgia.   Bo Merino, MD  Note - This record has been created using Editor, commissioning.  Chart creation errors have been sought, but may not always  have been located. Such creation errors do not reflect on  the standard of medical care.

## 2021-05-22 ENCOUNTER — Ambulatory Visit: Payer: Self-pay

## 2021-05-22 ENCOUNTER — Encounter: Payer: Self-pay | Admitting: Rheumatology

## 2021-05-22 ENCOUNTER — Other Ambulatory Visit: Payer: Self-pay

## 2021-05-22 ENCOUNTER — Ambulatory Visit (INDEPENDENT_AMBULATORY_CARE_PROVIDER_SITE_OTHER): Payer: No Typology Code available for payment source | Admitting: Rheumatology

## 2021-05-22 VITALS — BP 121/82 | HR 58 | Ht 60.5 in | Wt 194.5 lb

## 2021-05-22 DIAGNOSIS — M25562 Pain in left knee: Secondary | ICD-10-CM

## 2021-05-22 DIAGNOSIS — Z96651 Presence of right artificial knee joint: Secondary | ICD-10-CM

## 2021-05-22 DIAGNOSIS — G8929 Other chronic pain: Secondary | ICD-10-CM | POA: Diagnosis not present

## 2021-05-22 DIAGNOSIS — Z950 Presence of cardiac pacemaker: Secondary | ICD-10-CM

## 2021-05-22 DIAGNOSIS — Z9884 Bariatric surgery status: Secondary | ICD-10-CM

## 2021-05-22 DIAGNOSIS — M79672 Pain in left foot: Secondary | ICD-10-CM

## 2021-05-22 DIAGNOSIS — M79671 Pain in right foot: Secondary | ICD-10-CM

## 2021-05-22 DIAGNOSIS — M5136 Other intervertebral disc degeneration, lumbar region: Secondary | ICD-10-CM

## 2021-05-22 DIAGNOSIS — M79641 Pain in right hand: Secondary | ICD-10-CM

## 2021-05-22 DIAGNOSIS — R5383 Other fatigue: Secondary | ICD-10-CM

## 2021-05-22 DIAGNOSIS — Z8659 Personal history of other mental and behavioral disorders: Secondary | ICD-10-CM

## 2021-05-22 DIAGNOSIS — M2550 Pain in unspecified joint: Secondary | ICD-10-CM

## 2021-05-22 DIAGNOSIS — M87051 Idiopathic aseptic necrosis of right femur: Secondary | ICD-10-CM

## 2021-05-22 DIAGNOSIS — M79642 Pain in left hand: Secondary | ICD-10-CM

## 2021-05-22 DIAGNOSIS — M25512 Pain in left shoulder: Secondary | ICD-10-CM | POA: Diagnosis not present

## 2021-05-22 DIAGNOSIS — M7061 Trochanteric bursitis, right hip: Secondary | ICD-10-CM

## 2021-05-22 DIAGNOSIS — M7701 Medial epicondylitis, right elbow: Secondary | ICD-10-CM | POA: Diagnosis not present

## 2021-05-22 DIAGNOSIS — M791 Myalgia, unspecified site: Secondary | ICD-10-CM

## 2021-05-22 DIAGNOSIS — M7062 Trochanteric bursitis, left hip: Secondary | ICD-10-CM

## 2021-05-22 DIAGNOSIS — I495 Sick sinus syndrome: Secondary | ICD-10-CM

## 2021-05-22 DIAGNOSIS — D75839 Thrombocytosis, unspecified: Secondary | ICD-10-CM

## 2021-05-27 ENCOUNTER — Other Ambulatory Visit (INDEPENDENT_AMBULATORY_CARE_PROVIDER_SITE_OTHER): Payer: Self-pay | Admitting: Internal Medicine

## 2021-05-27 DIAGNOSIS — F419 Anxiety disorder, unspecified: Secondary | ICD-10-CM

## 2021-05-31 NOTE — Progress Notes (Deleted)
Office Visit Note  Patient: Diana Giles             Date of Birth: [birth year]           MRN: [id number]             PCP: Wilson Singer, MD Referring: Wilson Singer, MD Visit Date: 06/12/2021 Occupation: @GUAROCC @  Subjective:  No chief complaint on file.   History of Present Illness: Gavrielle Streck is a 50 y.o. female ***   Activities of Daily Living:  Patient reports morning stiffness for *** {minute/hour:19697}.   Patient {ACTIONS;DENIES/REPORTS:21021675::"Denies"} nocturnal pain.  Difficulty dressing/grooming: {ACTIONS;DENIES/REPORTS:21021675::"Denies"} Difficulty climbing stairs: {ACTIONS;DENIES/REPORTS:21021675::"Denies"} Difficulty getting out of chair: {ACTIONS;DENIES/REPORTS:21021675::"Denies"} Difficulty using hands for taps, buttons, cutlery, and/or writing: {ACTIONS;DENIES/REPORTS:21021675::"Denies"}  No Rheumatology ROS completed.   PMFS History:  Patient Active Problem List   Diagnosis Date Noted   Pacemaker 01/07/2021   Rheumatoid arthritis (HCC) 11/13/2020   Fatigue 11/13/2020   Class 2 obesity with body mass index (BMI) of 37.0 to 37.9 in adult 11/13/2020   DDD (degenerative disc disease), lumbar 11/13/2020   Avascular necrosis of bone of hip, right (HCC) 11/13/2020   Sinus node dysfunction (HCC) 11/13/2020   Thrombocytosis 11/13/2020   Anxiety 11/13/2020   Attention deficit hyperactivity disorder (ADHD) 11/13/2020    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HCC)    Bunion of left foot    COVID-19    DDD (degenerative disc disease), lumbar    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    Mental disorder    Rheumatoid arthritis (HCC)    Sinus node dysfunction (HCC)    s/p pacemaker   Thrombocytosis    Vaginal Pap smear, abnormal     Family History  Problem Relation Age of Onset   Hypertension Mother    Stroke Mother    Diabetes Father    Heart disease Father    Hyperthyroidism  Sister    COPD Sister    Obesity Brother    Hypertension Brother    Anxiety disorder Brother    Obesity Brother    Diabetes Brother    Asthma Brother    Asthma Brother    Polycystic ovary syndrome Daughter    Asthma Son    Obesity Son    Colon cancer Cousin    Past Surgical History:  Procedure Laterality Date   GASTRIC BYPASS  2010   285lbs at highest weight   HYSTEROSCOPY WITH D & C  08/22/2019   OTHER SURGICAL HISTORY  08/2019   Bilateral fallopian tube removal   PACEMAKER PLACEMENT  08/06/2020   REVISION TOTAL KNEE ARTHROPLASTY Right 06/01/2019   SHOULDER ARTHROSCOPY Left 2018   TUBAL LIGATION Bilateral 08/07/2019   Social History   Social History Narrative   Separated for 12 years,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   Immunization History  Administered Date(s) Administered   Influenza-Unspecified 07/31/2020   PFIZER(Purple Top)SARS-COV-2 Vaccination 02/09/2020, 03/01/2020, 09/18/2020   Tdap 12/17/2020   Zoster Recombinat (Shingrix) 09/23/2020     Objective: Vital Signs: There were no vitals taken for this visit.   Physical Exam   Musculoskeletal Exam: ***  CDAI Exam: CDAI Score: -- Patient Global: --; Provider Global: -- Swollen: --; Tender: -- Joint Exam 06/12/2021   No joint exam has been documented for this visit   There is currently no information documented on the homunculus. Go to the Rheumatology activity  and complete the homunculus joint exam.  Investigation: No additional findings.  Imaging: XR Foot 2 Views Left  Result Date: 05/22/2021 First MTP, PIP and DIP narrowing was noted.  No MTP, intertarsal, tibiotalar or subtalar joint space narrowing was noted.  No erosive changes were noted. Impression: These findings are consistent with osteoarthritis of the foot.    XR Foot 2 Views Right  Result Date: 05/22/2021 First MTP, PIP and DIP narrowing was noted.  No MTP, intertarsal, tibiotalar or subtalar joint space narrowing was  noted.  No erosive changes were noted. Impression: These findings are consistent with osteoarthritis of the foot.    XR Hand 2 View Left  Result Date: 05/22/2021 CMC, PIP and DIP narrowing was noted.  No MCP, intercarpal or radiocarpal joint space narrowing was noted.  No erosive changes were noted. Impression: These findings are consistent with osteoarthritis of the hand.  XR Hand 2 View Right  Result Date: 05/22/2021 CMC, PIP and DIP narrowing was noted.  No MCP, intercarpal or radiocarpal joint space narrowing was noted.  No erosive changes were noted. Impression: These findings are consistent with osteoarthritis of the hand.  XR KNEE 3 VIEW LEFT  Result Date: 05/22/2021 Mild medial compartment narrowing was noted.  Intercondylar osteophytes was noted.  Mild patellofemoral narrowing was noted. Impression: These findings are consistent mild osteoarthritis and mild chondromalacia patella.  XR Shoulder Left  Result Date: 05/22/2021 No glenohumeral or acromioclavicular narrowing was noted.  No chondrocalcinosis was noted.  Pacemaker was noted. Impression: Unremarkable x-ray of the shoulder joint.   Recent Labs: Lab Results  Component Value Date   WBC 8.0 04/09/2021   HGB 11.4 (L) 04/09/2021   PLT 431 (H) 04/09/2021   NA 137 04/09/2021   K 4.3 04/09/2021   CL 105 04/09/2021   CO2 23 04/09/2021   GLUCOSE 82 04/09/2021   BUN 13 04/09/2021   CREATININE 0.60 04/09/2021   BILITOT 0.7 04/09/2021   AST 15 04/09/2021   ALT 10 04/09/2021   PROT 7.3 04/09/2021   CALCIUM 9.3 04/09/2021   GFRAA 121 04/09/2021    Speciality Comments: No specialty comments available.  Procedures:  No procedures performed Allergies: Nickel, Penicillins, Shellfish allergy, and Sulfa antibiotics   Assessment / Plan:     Visit Diagnoses: No diagnosis found.  Orders: No orders of the defined types were placed in this encounter.  No orders of the defined types were placed in this  encounter.   Face-to-face time spent with patient was *** minutes. Greater than 50% of time was spent in counseling and coordination of care.  Follow-Up Instructions: No follow-ups on file.   Pollyann Savoy, MD  Note - This record has been created using Animal nutritionist.  Chart creation errors have been sought, but may not always  have been located. Such creation errors do not reflect on  the standard of medical care.

## 2021-06-01 LAB — CK: Total CK: 119 U/L (ref 29–143)

## 2021-06-01 LAB — SEDIMENTATION RATE: Sed Rate: 28 mm/h (ref 0–30)

## 2021-06-01 LAB — GLUCOSE 6 PHOSPHATE DEHYDROGENASE: G-6PDH: 16.6 U/g Hgb (ref 7.0–20.5)

## 2021-06-01 LAB — 14-3-3 ETA PROTEIN: 14-3-3 eta Protein: <0.2 ng/mL (ref <0.2)

## 2021-06-01 LAB — HLA-B27 ANTIGEN: HLA-B27 Antigen: NEGATIVE

## 2021-06-01 LAB — CYCLIC CITRUL PEPTIDE ANTIBODY, IGG: Cyclic Citrullin Peptide Ab: <16 UNITS

## 2021-06-01 LAB — RHEUMATOID FACTOR: Rheumatoid fact SerPl-aCnc: <14 IU/mL (ref <14)

## 2021-06-08 ENCOUNTER — Other Ambulatory Visit: Payer: Self-pay | Admitting: Neurosurgery

## 2021-06-08 ENCOUNTER — Other Ambulatory Visit (HOSPITAL_COMMUNITY): Payer: Self-pay | Admitting: Neurosurgery

## 2021-06-08 DIAGNOSIS — G8929 Other chronic pain: Secondary | ICD-10-CM

## 2021-06-09 ENCOUNTER — Other Ambulatory Visit (HOSPITAL_COMMUNITY): Payer: Self-pay | Admitting: Neurosurgery

## 2021-06-09 DIAGNOSIS — G8929 Other chronic pain: Secondary | ICD-10-CM

## 2021-06-12 ENCOUNTER — Ambulatory Visit: Payer: No Typology Code available for payment source | Admitting: Rheumatology

## 2021-06-12 DIAGNOSIS — G8929 Other chronic pain: Secondary | ICD-10-CM

## 2021-06-12 DIAGNOSIS — M797 Fibromyalgia: Secondary | ICD-10-CM

## 2021-06-12 DIAGNOSIS — I495 Sick sinus syndrome: Secondary | ICD-10-CM

## 2021-06-12 DIAGNOSIS — M2550 Pain in unspecified joint: Secondary | ICD-10-CM

## 2021-06-12 DIAGNOSIS — D75839 Thrombocytosis, unspecified: Secondary | ICD-10-CM

## 2021-06-12 DIAGNOSIS — M19041 Primary osteoarthritis, right hand: Secondary | ICD-10-CM

## 2021-06-12 DIAGNOSIS — M7061 Trochanteric bursitis, right hip: Secondary | ICD-10-CM

## 2021-06-12 DIAGNOSIS — Z950 Presence of cardiac pacemaker: Secondary | ICD-10-CM

## 2021-06-12 DIAGNOSIS — R5383 Other fatigue: Secondary | ICD-10-CM

## 2021-06-12 DIAGNOSIS — M19071 Primary osteoarthritis, right ankle and foot: Secondary | ICD-10-CM

## 2021-06-12 DIAGNOSIS — Z9884 Bariatric surgery status: Secondary | ICD-10-CM

## 2021-06-12 DIAGNOSIS — M5136 Other intervertebral disc degeneration, lumbar region: Secondary | ICD-10-CM

## 2021-06-12 DIAGNOSIS — Z8659 Personal history of other mental and behavioral disorders: Secondary | ICD-10-CM

## 2021-06-12 DIAGNOSIS — M7701 Medial epicondylitis, right elbow: Secondary | ICD-10-CM

## 2021-06-12 DIAGNOSIS — M87051 Idiopathic aseptic necrosis of right femur: Secondary | ICD-10-CM

## 2021-06-12 DIAGNOSIS — Z96651 Presence of right artificial knee joint: Secondary | ICD-10-CM

## 2021-06-15 ENCOUNTER — Encounter (INDEPENDENT_AMBULATORY_CARE_PROVIDER_SITE_OTHER): Payer: Self-pay | Admitting: Nurse Practitioner

## 2021-06-22 ENCOUNTER — Encounter (INDEPENDENT_AMBULATORY_CARE_PROVIDER_SITE_OTHER): Payer: Self-pay | Admitting: Nurse Practitioner

## 2021-06-24 ENCOUNTER — Other Ambulatory Visit (INDEPENDENT_AMBULATORY_CARE_PROVIDER_SITE_OTHER): Payer: Self-pay | Admitting: Nurse Practitioner

## 2021-06-24 DIAGNOSIS — R300 Dysuria: Secondary | ICD-10-CM

## 2021-06-24 DIAGNOSIS — R350 Frequency of micturition: Secondary | ICD-10-CM

## 2021-06-26 LAB — URINALYSIS W MICROSCOPIC + REFLEX CULTURE
Bacteria, UA: NONE SEEN /HPF
Bilirubin Urine: NEGATIVE
Glucose, UA: NEGATIVE
Hgb urine dipstick: NEGATIVE
Ketones, ur: NEGATIVE
Leukocyte Esterase: NEGATIVE
Nitrites, Initial: NEGATIVE
Protein, ur: NEGATIVE
RBC / HPF: NONE SEEN /HPF (ref 0–2)
Specific Gravity, Urine: 1.007 (ref 1.001–1.035)
Squamous Epithelial / HPF: NONE SEEN /HPF (ref <=5)
WBC, UA: NONE SEEN /HPF (ref 0–5)
pH: 6 (ref 5.0–8.0)

## 2021-06-26 LAB — NO CULTURE INDICATED

## 2021-07-10 ENCOUNTER — Ambulatory Visit (HOSPITAL_COMMUNITY): Payer: No Typology Code available for payment source

## 2021-07-15 ENCOUNTER — Ambulatory Visit: Payer: Self-pay | Admitting: Nurse Practitioner

## 2021-07-15 ENCOUNTER — Encounter: Payer: Self-pay | Admitting: Nurse Practitioner

## 2021-07-15 ENCOUNTER — Ambulatory Visit (INDEPENDENT_AMBULATORY_CARE_PROVIDER_SITE_OTHER): Payer: No Typology Code available for payment source | Admitting: Internal Medicine

## 2021-07-21 ENCOUNTER — Ambulatory Visit: Payer: No Typology Code available for payment source | Admitting: Rheumatology

## 2021-07-24 ENCOUNTER — Other Ambulatory Visit: Payer: Self-pay

## 2021-07-24 ENCOUNTER — Ambulatory Visit (HOSPITAL_COMMUNITY)
Admission: RE | Admit: 2021-07-24 | Discharge: 2021-07-24 | Disposition: A | Discharge status: Home / Self Care | Payer: BLUE CROSS/BLUE SHIELD | Source: Ambulatory Visit | Attending: Physician Assistant | Admitting: Physician Assistant

## 2021-07-24 ENCOUNTER — Ambulatory Visit: Payer: No Typology Code available for payment source | Admitting: Nurse Practitioner

## 2021-07-24 ENCOUNTER — Ambulatory Visit (HOSPITAL_COMMUNITY)
Admission: RE | Admit: 2021-07-24 | Discharge: 2021-07-24 | Disposition: A | Discharge status: Home / Self Care | Payer: BLUE CROSS/BLUE SHIELD | Source: Ambulatory Visit | Attending: Neurosurgery | Admitting: Neurosurgery

## 2021-07-24 DIAGNOSIS — M5441 Lumbago with sciatica, right side: Secondary | ICD-10-CM | POA: Insufficient documentation

## 2021-07-24 DIAGNOSIS — Z950 Presence of cardiac pacemaker: Secondary | ICD-10-CM | POA: Diagnosis present

## 2021-07-24 DIAGNOSIS — G8929 Other chronic pain: Secondary | ICD-10-CM | POA: Diagnosis present

## 2021-07-24 NOTE — Progress Notes (Signed)
[facility] today at [facility] for [facility] lumbar spine wo. Patient has medtronic device. CLE sent. Orders received for DOO 100

## 2021-07-28 ENCOUNTER — Other Ambulatory Visit: Payer: Self-pay | Admitting: Obstetrics & Gynecology

## 2021-07-28 ENCOUNTER — Other Ambulatory Visit (INDEPENDENT_AMBULATORY_CARE_PROVIDER_SITE_OTHER): Payer: Self-pay | Admitting: Nurse Practitioner

## 2021-07-28 DIAGNOSIS — F419 Anxiety disorder, unspecified: Secondary | ICD-10-CM

## 2021-08-06 ENCOUNTER — Other Ambulatory Visit: Payer: Self-pay

## 2021-08-06 ENCOUNTER — Ambulatory Visit: Payer: No Typology Code available for payment source | Admitting: Nurse Practitioner

## 2021-08-06 ENCOUNTER — Encounter: Payer: Self-pay | Admitting: Nurse Practitioner

## 2021-08-06 VITALS — BP 122/82 | HR 79 | Temp 97.9°F | Resp 18 | Ht 60.5 in | Wt 195.4 lb

## 2021-08-06 DIAGNOSIS — R350 Frequency of micturition: Secondary | ICD-10-CM

## 2021-08-06 DIAGNOSIS — N951 Menopausal and female climacteric states: Secondary | ICD-10-CM

## 2021-08-06 DIAGNOSIS — R5381 Other malaise: Secondary | ICD-10-CM | POA: Diagnosis not present

## 2021-08-06 DIAGNOSIS — R7303 Prediabetes: Secondary | ICD-10-CM

## 2021-08-06 DIAGNOSIS — R5383 Other fatigue: Secondary | ICD-10-CM

## 2021-08-06 DIAGNOSIS — F419 Anxiety disorder, unspecified: Secondary | ICD-10-CM | POA: Diagnosis not present

## 2021-08-06 DIAGNOSIS — Z87892 Personal history of anaphylaxis: Secondary | ICD-10-CM

## 2021-08-06 LAB — POCT URINALYSIS DIPSTICK
Bilirubin, UA: NEGATIVE
Blood, UA: NEGATIVE
Glucose, UA: NEGATIVE
Ketones, UA: NEGATIVE
Leukocytes, UA: NEGATIVE
Nitrite, UA: NEGATIVE
Protein, UA: NEGATIVE
Spec Grav, UA: 1.01 (ref 1.010–1.025)
Urobilinogen, UA: 0.2 E.U./dL
pH, UA: 6 (ref 5.0–8.0)

## 2021-08-06 LAB — CBC WITH DIFFERENTIAL/PLATELET
Basophils Absolute: 0.1 10*3/uL (ref 0.0–0.1)
Basophils Relative: 0.6 % (ref 0.0–3.0)
Eosinophils Absolute: 0.1 10*3/uL (ref 0.0–0.7)
Eosinophils Relative: 1.3 % (ref 0.0–5.0)
HCT: 35.5 % — ABNORMAL LOW (ref 36.0–46.0)
Hemoglobin: 11.5 g/dL — ABNORMAL LOW (ref 12.0–15.0)
Lymphocytes Relative: 33.1 % (ref 12.0–46.0)
Lymphs Abs: 2.9 10*3/uL (ref 0.7–4.0)
MCHC: 32.4 g/dL (ref 30.0–36.0)
MCV: 89.3 fl (ref 78.0–100.0)
Monocytes Absolute: 0.6 10*3/uL (ref 0.1–1.0)
Monocytes Relative: 7.2 % (ref 3.0–12.0)
Neutro Abs: 5 10*3/uL (ref 1.4–7.7)
Neutrophils Relative %: 57.8 % (ref 43.0–77.0)
Platelets: 410 10*3/uL — ABNORMAL HIGH (ref 150.0–400.0)
RBC: 3.97 Mil/uL (ref 3.87–5.11)
RDW: 15 % (ref 11.5–15.5)
WBC: 8.7 10*3/uL (ref 4.0–10.5)

## 2021-08-06 LAB — URINALYSIS WITH CULTURE, IF INDICATED
Bilirubin Urine: NEGATIVE
Hgb urine dipstick: NEGATIVE
Ketones, ur: NEGATIVE
Leukocytes,Ua: NEGATIVE
Nitrite: NEGATIVE
RBC / HPF: NONE SEEN (ref 0–?)
Specific Gravity, Urine: <=1.005 — AB (ref 1.000–1.030)
Total Protein, Urine: NEGATIVE
Urine Glucose: NEGATIVE
Urobilinogen, UA: 0.2 (ref 0.0–1.0)
pH: 6 (ref 5.0–8.0)

## 2021-08-06 LAB — COMPREHENSIVE METABOLIC PANEL
ALT: 10 U/L (ref 0–35)
AST: 14 U/L (ref 0–37)
Albumin: 4.1 g/dL (ref 3.5–5.2)
Alkaline Phosphatase: 114 U/L (ref 39–117)
BUN: 11 mg/dL (ref 6–23)
CO2: 28 mEq/L (ref 19–32)
Calcium: 9 mg/dL (ref 8.4–10.5)
Chloride: 106 mEq/L (ref 96–112)
Creatinine, Ser: 0.68 mg/dL (ref 0.40–1.20)
GFR: 100.17 mL/min (ref >60.00)
Glucose, Bld: 72 mg/dL (ref 70–99)
Potassium: 3.9 mEq/L (ref 3.5–5.1)
Sodium: 141 mEq/L (ref 135–145)
Total Bilirubin: 0.5 mg/dL (ref 0.2–1.2)
Total Protein: 7.2 g/dL (ref 6.0–8.3)

## 2021-08-06 LAB — HEMOGLOBIN A1C: Hgb A1c MFr Bld: 5.9 % (ref 4.6–6.5)

## 2021-08-06 MED ORDER — DULOXETINE HCL 60 MG PO CPEP: 60.0000 mg | ORAL_CAPSULE | Freq: Two times a day (BID) | ORAL | 1 refills | Status: DC

## 2021-08-06 MED ORDER — EPINEPHRINE 0.3 MG/0.3ML IJ SOAJ: 0.3000 mg | INTRAMUSCULAR | 2 refills | Status: DC | PRN

## 2021-08-06 MED ORDER — THYROID 30 MG PO TABS: ORAL_TABLET | ORAL | 0 refills | Status: DC

## 2021-08-06 NOTE — Progress Notes (Signed)
Office Visit Note  Patient: Diana Giles             Date of Birth: 1971/08/22           MRN: [id number]             PCP: Ailene Ards, NP Referring: Doree Albee, MD Visit Date: 08/13/2021 Occupation: _0 @  Subjective:  Follow-up (Aching)   History of Present Illness: Allure Greaser is a 50 y.o. female with a history of osteoarthritis and fibromyalgia syndrome.  She was given the diagnosis of possible rheumatoid arthritis in [place] and was given sulfasalazine which did not help.  She continues to have discomfort in her left shoulder, bilateral elbows, right trochanter, both knees and left foot.  She has not noticed any joint swelling.  She continues to have generalized pain and discomfort.  Activities of Daily Living:  Patient reports morning stiffness for 40 minutes.   Patient Reports nocturnal pain.  Difficulty dressing/grooming: Reports Difficulty climbing stairs: Reports Difficulty getting out of chair: Reports Difficulty using hands for taps, buttons, cutlery, and/or writing: Reports  Review of Systems  Constitutional:  Positive for fatigue.  HENT:  Positive for mouth dryness.   Eyes:  Positive for dryness.  Respiratory:  Negative for shortness of breath.   Cardiovascular:  Negative for swelling in legs/feet.  Gastrointestinal:  Positive for constipation.  Endocrine: Positive for heat intolerance, excessive thirst and increased urination.  Genitourinary:  Negative for difficulty urinating.  Musculoskeletal:  Positive for joint pain, gait problem, joint pain, joint swelling, muscle weakness, morning stiffness and muscle tenderness.  Skin:  Negative for rash.  Allergic/Immunologic: Negative for susceptible to infections.  Neurological:  Positive for numbness and weakness.  Hematological:  Positive for bruising/bleeding tendency.  Psychiatric/Behavioral:  Positive for sleep disturbance.    PMFS History:  Patient Active Problem List   Diagnosis Date  Noted   Pacemaker 01/07/2021   Rheumatoid arthritis ([place]) 11/13/2020   Fatigue 11/13/2020   Class 2 obesity with body mass index (BMI) of 37.0 to 37.9 in adult 11/13/2020   DDD (degenerative disc disease), lumbar 11/13/2020   Avascular necrosis of bone of hip, right ([place]) 11/13/2020   Sinus node dysfunction ([place]) 11/13/2020   Thrombocytosis 11/13/2020   Anxiety 11/13/2020   Attention deficit hyperactivity disorder (ADHD) 11/13/2020    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[place])    Bunion of left foot    COVID-19    DDD (degenerative disc disease), lumbar    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    Mental disorder    Rheumatoid arthritis ([place])    Sinus node dysfunction ([place])    s/p pacemaker   Thrombocytosis    Vaginal Pap smear, abnormal     Family History  Problem Relation Age of Onset   Hypertension Mother    Stroke Mother    Diabetes Father    Heart disease Father    Hyperthyroidism Sister    COPD Sister    Obesity Brother    Hypertension Brother    Anxiety disorder Brother    Obesity Brother    Diabetes Brother    Asthma Brother    Asthma Brother    Polycystic ovary syndrome Daughter    Asthma Son    Obesity Son    Colon cancer Cousin    Past Surgical History:  Procedure Laterality Date   GASTRIC BYPASS  2010   285lbs at highest weight   HYSTEROSCOPY WITH D & C  08/22/2019   OTHER SURGICAL HISTORY  08/2019   Bilateral fallopian tube removal   PACEMAKER PLACEMENT  08/06/2020   REVISION TOTAL KNEE ARTHROPLASTY Right 06/01/2019   SHOULDER ARTHROSCOPY Left 2018   TUBAL LIGATION Bilateral 08/07/2019   Social History   Social History Narrative   Separated for 12 years,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   Immunization History  Administered Date(s) Administered   Influenza-Unspecified 07/31/2020   PFIZER(Purple Top)SARS-COV-2 Vaccination 02/09/2020, 03/01/2020, 09/18/2020   Tdap  12/17/2020   Zoster Recombinat (Shingrix) 09/23/2020     Objective: Vital Signs: BP 104/70 (BP Location: Left Arm, Patient Position: Sitting, Cuff Size: Normal)   Pulse 89   Resp 15   Ht 5' (1.524 m)   Wt 194 lb (88 kg)   BMI 37.89 kg/m    Physical Exam Vitals and nursing note reviewed.  Constitutional:      Appearance: She is well-developed.  HENT:     Head: Normocephalic and atraumatic.  Eyes:     Conjunctiva/sclera: Conjunctivae normal.  Cardiovascular:     Rate and Rhythm: Normal rate and regular rhythm.     Heart sounds: Normal heart sounds.  Pulmonary:     Effort: Pulmonary effort is normal.     Breath sounds: Normal breath sounds.  Abdominal:     General: Bowel sounds are normal.     Palpations: Abdomen is soft.  Musculoskeletal:     Cervical back: Normal range of motion.  Lymphadenopathy:     Cervical: No cervical adenopathy.  Skin:    General: Skin is warm and dry.     Capillary Refill: Capillary refill takes less than 2 seconds.  Neurological:     Mental Status: She is alert and oriented to person, place, and time.  Psychiatric:        Behavior: Behavior normal.     Musculoskeletal Exam: C-spine was in good range of motion.  She had discomfort range of motion of her thoracic and lumbar spine.  Shoulder joints, elbow joints, wrist joints, MCPs PIPs and DIPs with good range of motion.  She had tenderness on palpation of bilateral medial epicondyle.  She had bilateral PIP and DIP thickening with no synovitis.  She had tenderness over trochanteric bursa.  She had discomfort range of motion of her knee joints without any warmth swelling or effusion.  There was no tenderness over ankles or MTPs.  CDAI Exam: CDAI Score: -- Patient Global: --; Provider Global: -- Swollen: --; Tender: -- Joint Exam 08/13/2021   No joint exam has been documented for this visit   There is currently no information documented on the homunculus. Go to the Rheumatology activity and  complete the homunculus joint exam.  Investigation: No additional findings.  Imaging: DG Chest 1 View  Result Date: 07/24/2021 CLINICAL DATA:  Pre MRI EXAM: CHEST  1 VIEW COMPARISON:  None. FINDINGS: Left dual lead pacemaker with leads positioned over the expected area of the right atrium and right ventricle. No evidence of fractured or abandoned leads. The heart size and mediastinal contours are within normal limits. Both lungs are clear. The visualized skeletal structures are unremarkable. IMPRESSION: No evidence of fractured or abandoned pacer leads. Electronically Signed   By: Yetta Glassman M.D.   On: 07/24/2021 12:43   MR LUMBAR SPINE WO CONTRAST  Result Date: 07/25/2021 CLINICAL DATA:  Low back pain, multiple falls EXAM: MRI LUMBAR SPINE WITHOUT  CONTRAST TECHNIQUE: Multiplanar, multisequence MR imaging of the lumbar spine was performed. No intravenous contrast was administered. COMPARISON:  None. FINDINGS: Segmentation:  Standard. Alignment:  Mild levocurvature.  No listhesis. Vertebrae: No acute fracture, evidence of discitis, or suspicious bone lesion. Multilevel endplate degenerative changes. Conus medullaris and cauda equina: Conus extends to the T12-L1 level. Conus and cauda equina appear normal. Paraspinal and other soft tissues: Negative. Disc levels: T12-L1: Seen only on the sagittal images. Mild disc bulge. No spinal canal stenosis or neural foraminal narrowing. L1-L2: No significant disc bulge. No spinal canal stenosis or neural foraminal narrowing. L2-L3: No significant disc bulge. No spinal canal stenosis or neural foraminal narrowing. L3-L4: No significant disc bulge. No spinal canal stenosis or neural foraminal narrowing. L4-L5: Disc height loss with mild broad-based disc bulge. Mild facet arthropathy. No spinal canal stenosis. Mild left neural foraminal narrowing. L5-S1: Disc height loss with broad-based disc bulge with superimposed left foraminal protrusion. Mild facet  arthropathy. No spinal canal stenosis. No neural foraminal narrowing. IMPRESSION: Mild degenerative changes in the lumbar spine, with mild left neural foraminal narrowing at L4-L5 and no spinal canal stenosis. Electronically Signed   By: Merilyn Baba M.D.   On: 07/25/2021 03:19    Recent Labs: Lab Results  Component Value Date   WBC 8.7 08/06/2021   HGB 11.5 (L) 08/06/2021   PLT 410.0 (H) 08/06/2021   NA 141 08/06/2021   K 3.9 08/06/2021   CL 106 08/06/2021   CO2 28 08/06/2021   GLUCOSE 72 08/06/2021   BUN 11 08/06/2021   CREATININE 0.68 08/06/2021   BILITOT 0.5 08/06/2021   ALKPHOS 114 08/06/2021   AST 14 08/06/2021   ALT 10 08/06/2021   PROT 7.2 08/06/2021   ALBUMIN 4.1 08/06/2021   CALCIUM 9.0 08/06/2021   GFRAA 121 04/09/2021   May 22, 2001 CK119, ESR 28, RF negative, anti-CCP negative, _0 eta negative, HLA-B27 negative, G6PD normal   Speciality Comments: No specialty comments available.  Procedures:  No procedures performed Allergies: Nickel, Penicillins, Shellfish allergy, and Sulfa antibiotics   Assessment / Plan:     Visit Diagnoses: Chronic left shoulder pain - She had painful range of motion of her left shoulder.  X-rays obtained at the last visit were unremarkable.  Exercises were emphasized.  She is also going to physical therapy.  Medial epicondylitis of elbow, right - She works as a Charity fundraiser.  She has been having discomfort in bilateral medial epicondyle region.  Pain is more prominent on the right side, most likely related to her work.  Primary osteoarthritis of both hands - She gives history of discomfort and intermittent swelling in her bilateral hands.  All the labs are within normal limits.  Left findings were discussed with the patient.  She was dxd with RA and [place] and was treated withSSZ.  X-rays were c/w OA.  X-ray findings were discussed.  Joint protection muscle strengthening was discussed.  I also advised her to contact me in case she develops  any swelling and we can consider ultrasound of her bilateral hands.  Trochanteric bursitis of both hips - IT band stretches were discussed with the last visit.  She has noticed some benefits from doing IT band stretches.  Avascular necrosis of bone of hip, right ([place]) - Diagnosed in [place] pression.  No surgery was required.  Hx of total knee replacement, right - Total knee replacement 2015, revision 2020.  She still has chronic discomfort.  Primary osteoarthritis of left  knee - History of chronic pain.  No synovitis was noted.  Mild OA and mild chondromalacia patella was noted on the x-rays.  She is going to physical therapy.  Primary osteoarthritis of both feet - Clinical and radiographic findings are consistent with osteoarthritis.  DDD (degenerative disc disease), lumbar - July 24, 2021 MRI of the lumbar spine showed mild degenerative changes and facet joint arthropathy.  No spinal stenosis was noted.  MRI findings were discussed with the patient.  She is going to physical therapy.  Fibromyalgia - History of generalized pain, hyperalgesia.  CK normal.  All standard and fibromyalgia syndrome was provided.  Encouraged swimming and water aerobics.    Other fatigue-related to fibromyalgia.  Thrombocytosis - Evaluated by hematologist in the past.  Pacemaker  Sinus node dysfunction ([place])  History of gastric bypass - 2010.  History of anxiety  History of ADHD  Orders: No orders of the defined types were placed in this encounter.  No orders of the defined types were placed in this encounter.    Follow-Up Instructions: Return in about 6 months (around 02/11/2022) for Osteoarthritis.   Bo Merino, MD  Note - This record has been created using Editor, commissioning.  Chart creation errors have been sought, but may not always  have been located. Such creation errors do not reflect on  the standard of medical care.

## 2021-08-06 NOTE — Progress Notes (Signed)
Subjective:  Patient ID: Diana Giles, female    DOB: 12/31/1970  Age: 50 y.o. MRN: [id number]  CC:  Chief Complaint  Patient presents with   Re-Establishing Care    Concerns for UTI. Experiencing frequency that started 4 days ago. She is also having some pressure when using the restroom.       HPI  This patient arrives today for the above.  She is reestablishing care with me as I recently left my previous practice and she is following me here.  She tells me overall she is been feeling well.  She is been working with physical therapy for low back pain and sciatica which is helped.  She is also been seeing behavioral medicine for treatment of ADHD and is started Adderall which seems to help her symptoms.  She continues on Klonopin for her anxiety.  She needs a refill on her Cymbalta today for anxiety.  She does mention she is been having some urinary frequency for the last 4 days, she denies any flank pain or hematuria.  She also denies fever.  She is on bioidentical hormone replacement therapy for menopausal symptoms and would like referral to a new OB/GYN that is closer to her for management of this.  She is also on NP thyroid for the off label treatment of her fatigue, she tells me she has not noticed much improvement in her symptoms of fatigue since starting it.  She does have a history of prediabetes and would like to know what her A1c is currently.  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    Mental disorder    Rheumatoid arthritis ([place])    Sinus node dysfunction ([facility])    s/p pacemaker   Thrombocytosis    Vaginal Pap smear, abnormal       Family History  Problem Relation Age of Onset   Hypertension Mother    Stroke Mother    Diabetes Father    Heart disease Father    Hyperthyroidism  Sister    COPD Sister    Obesity Brother    Hypertension Brother    Anxiety disorder Brother    Obesity Brother    Diabetes Brother    Asthma Brother    Asthma Brother    Polycystic ovary syndrome Daughter    Asthma Son    Obesity Son    Colon cancer Cousin     Social History   Social History Narrative   Separated for 12 years,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   Social History   Tobacco Use   Smoking status: Never   Smokeless tobacco: Never  Substance Use Topics   Alcohol use: Yes    Comment: Occasionally     Current Meds  Medication Sig   acetaminophen (TYLENOL) 500 MG tablet Take 500 mg by mouth every 6 (six) hours as needed.   amphetamine-dextroamphetamine (ADDERALL XR) 20 MG 24 hr capsule Take 20 mg by mouth daily as needed.   Cholecalciferol (VITAMIN D3) 125 MCG (5000 UT) CAPS Take 2 capsules by mouth in the morning and at bedtime. 2 in AM & 2 in PM. (10,000iu /2xday).   clonazePAM (KLONOPIN) 1 MG tablet TAKE 1 TABLET BY MOUTH THREE TIMES A DAY  AS NEEDED FOR ANXIETY   ibuprofen (ADVIL) 200 MG tablet Take 400 mg by mouth every 6 (six) hours as needed.   Nutritional Supplements (DHEA) 15-50 MG CAPS Take 30 mg by mouth daily at 12 noon.   progesterone (PROMETRIUM) 200 MG capsule TAKE 1 CAPSULE (200 MG TOTAL) BY MOUTH DAILY. FOR 21 DAYS THEN OFF FOR 7 DAYS   thyroid (NP THYROID) 30 MG tablet Taper off of this medication. Take 1 tablet by mouth daily x 1 week, then take 1 tablet by mouth every other day x 1-2 weeks, then stop the medication.   vitamin B-12 (CYANOCOBALAMIN) 500 MCG tablet Take 500 mcg by mouth daily.   [DISCONTINUED] DULoxetine (CYMBALTA) 60 MG capsule Take 60 mg by mouth 2 (two) times daily.   [DISCONTINUED] EPINEPHrine 0.3 mg/0.3 mL IJ SOAJ injection Inject 0.3 mg into the muscle as needed for anaphylaxis.   [DISCONTINUED] NP THYROID 60 MG tablet Take 1 tablet (60 mg total) by mouth daily before breakfast.    ROS:  See HPI   Objective:    Today's Vitals: BP 122/82   Pulse 79   Temp 97.9 F (36.6 C) (Oral)   Resp 18   Ht 5' 0.5" (1.537 m)   Wt 195 lb 6.4 oz (88.6 kg)   SpO2 99%   BMI 37.53 kg/m  Vitals with BMI 08/06/2021 05/22/2021 05/22/2021  Height 5' 0.5" - 5' 0.5"  Weight 195 lbs 6 oz - 194 lbs 8 oz  BMI 07.37 - 10.62  Systolic [id number]  Diastolic 82 82 84  Pulse 79 - 58     Physical Exam Vitals reviewed.  Constitutional:      General: She is not in acute distress.    Appearance: Normal appearance.  HENT:     Head: Normocephalic and atraumatic.  Neck:     Vascular: No carotid bruit.  Cardiovascular:     Rate and Rhythm: Normal rate and regular rhythm.     Pulses: Normal pulses.     Heart sounds: Normal heart sounds.  Pulmonary:     Effort: Pulmonary effort is normal.     Breath sounds: Normal breath sounds.  Skin:    General: Skin is warm and dry.  Neurological:     General: No focal deficit present.     Mental Status: She is alert and oriented to person, place, and time.  Psychiatric:        Mood and Affect: Mood normal.        Behavior: Behavior normal.        Judgment: Judgment normal.         Assessment and Plan   1. Urinary frequency   2. Hot flashes due to menopause   3. Malaise and fatigue   4. Anxiety   5. History of anaphylaxis   6. Prediabetes      Plan: 1.  UA in office was negative however we will send off for lab urinalysis and reflex to culture.  For now recommend she just focus on hydration regularly throughout the day.  We will check CMP and CBC as well. 2.  We will refer to OB/GYN in the area for management of her hormone therapy for menopause. 3.  I recommended she taper off of the NP thyroid and she is agreeable to doing this.  I will send prescription of lower dose of NP thyroid with instructions on tapering. 4.  We will refill Cymbalta, she will continue on Klonopin as needed. 5.  She did mention she needs a refill on her EpiPen so we will send that to her  pharmacy as well. 6.  We will check A1c today.   Tests ordered Orders Placed This Encounter  Procedures   Urinalysis with Culture, if indicated   Comp Met (CMET)   CBC with Differential/Platelet   HgB A1c   Ambulatory referral to Obstetrics / Gynecology   POCT Urinalysis Dipstick      Meds ordered this encounter  Medications   thyroid (NP THYROID) 30 MG tablet    Sig: Taper off of this medication. Take 1 tablet by mouth daily x 1 week, then take 1 tablet by mouth every other day x 1-2 weeks, then stop the medication.    Dispense:  30 tablet    Refill:  0    Order Specific Question:   Supervising Provider    Answer:   BURNS, Claudina Lick [[id number]]   DULoxetine (CYMBALTA) 60 MG capsule    Sig: Take 1 capsule (60 mg total) by mouth 2 (two) times daily.    Dispense:  180 capsule    Refill:  1    Order Specific Question:   Supervising Provider    Answer:   Binnie Rail [[id number]]   EPINEPHrine 0.3 mg/0.3 mL IJ SOAJ injection    Sig: Inject 0.3 mg into the muscle as needed for anaphylaxis.    Dispense:  1 each    Refill:  2    Order Specific Question:   Supervising Provider    Answer:   Binnie Rail [id number]    Patient to follow-up in 3 months or sooner as needed.  Ailene Ards, NP

## 2021-08-13 ENCOUNTER — Encounter: Payer: Self-pay | Admitting: Rheumatology

## 2021-08-13 ENCOUNTER — Ambulatory Visit: Payer: No Typology Code available for payment source | Admitting: Rheumatology

## 2021-08-13 ENCOUNTER — Other Ambulatory Visit: Payer: Self-pay

## 2021-08-13 ENCOUNTER — Other Ambulatory Visit: Payer: Self-pay | Admitting: Nurse Practitioner

## 2021-08-13 ENCOUNTER — Encounter: Payer: Self-pay | Admitting: Nurse Practitioner

## 2021-08-13 VITALS — BP 104/70 | HR 89 | Resp 15 | Ht 60.0 in | Wt 194.0 lb

## 2021-08-13 DIAGNOSIS — M51369 Other intervertebral disc degeneration, lumbar region without mention of lumbar back pain or lower extremity pain: Secondary | ICD-10-CM

## 2021-08-13 DIAGNOSIS — M19041 Primary osteoarthritis, right hand: Secondary | ICD-10-CM | POA: Diagnosis not present

## 2021-08-13 DIAGNOSIS — Z8659 Personal history of other mental and behavioral disorders: Secondary | ICD-10-CM

## 2021-08-13 DIAGNOSIS — M791 Myalgia, unspecified site: Secondary | ICD-10-CM

## 2021-08-13 DIAGNOSIS — M5136 Other intervertebral disc degeneration, lumbar region: Secondary | ICD-10-CM

## 2021-08-13 DIAGNOSIS — M7701 Medial epicondylitis, right elbow: Secondary | ICD-10-CM

## 2021-08-13 DIAGNOSIS — M19042 Primary osteoarthritis, left hand: Secondary | ICD-10-CM

## 2021-08-13 DIAGNOSIS — M87051 Idiopathic aseptic necrosis of right femur: Secondary | ICD-10-CM

## 2021-08-13 DIAGNOSIS — D75839 Thrombocytosis, unspecified: Secondary | ICD-10-CM

## 2021-08-13 DIAGNOSIS — M19071 Primary osteoarthritis, right ankle and foot: Secondary | ICD-10-CM

## 2021-08-13 DIAGNOSIS — G8929 Other chronic pain: Secondary | ICD-10-CM

## 2021-08-13 DIAGNOSIS — R5383 Other fatigue: Secondary | ICD-10-CM

## 2021-08-13 DIAGNOSIS — M19072 Primary osteoarthritis, left ankle and foot: Secondary | ICD-10-CM

## 2021-08-13 DIAGNOSIS — M7061 Trochanteric bursitis, right hip: Secondary | ICD-10-CM | POA: Diagnosis not present

## 2021-08-13 DIAGNOSIS — M25512 Pain in left shoulder: Secondary | ICD-10-CM

## 2021-08-13 DIAGNOSIS — R350 Frequency of micturition: Secondary | ICD-10-CM

## 2021-08-13 DIAGNOSIS — I495 Sick sinus syndrome: Secondary | ICD-10-CM

## 2021-08-13 DIAGNOSIS — M797 Fibromyalgia: Secondary | ICD-10-CM

## 2021-08-13 DIAGNOSIS — M1712 Unilateral primary osteoarthritis, left knee: Secondary | ICD-10-CM

## 2021-08-13 DIAGNOSIS — M7062 Trochanteric bursitis, left hip: Secondary | ICD-10-CM

## 2021-08-13 DIAGNOSIS — Z950 Presence of cardiac pacemaker: Secondary | ICD-10-CM

## 2021-08-13 DIAGNOSIS — Z96651 Presence of right artificial knee joint: Secondary | ICD-10-CM

## 2021-08-13 DIAGNOSIS — Z9884 Bariatric surgery status: Secondary | ICD-10-CM

## 2021-08-13 MED ORDER — CIPROFLOXACIN HCL 250 MG PO TABS: 250.0000 mg | ORAL_TABLET | Freq: Two times a day (BID) | ORAL | 0 refills | Status: AC

## 2021-09-03 ENCOUNTER — Other Ambulatory Visit: Payer: Self-pay | Admitting: Nurse Practitioner

## 2021-09-03 DIAGNOSIS — R5383 Other fatigue: Secondary | ICD-10-CM

## 2021-09-14 ENCOUNTER — Other Ambulatory Visit (INDEPENDENT_AMBULATORY_CARE_PROVIDER_SITE_OTHER): Payer: Self-pay | Admitting: Nurse Practitioner

## 2021-09-14 DIAGNOSIS — F419 Anxiety disorder, unspecified: Secondary | ICD-10-CM

## 2021-09-16 ENCOUNTER — Other Ambulatory Visit (INDEPENDENT_AMBULATORY_CARE_PROVIDER_SITE_OTHER): Payer: Self-pay | Admitting: Nurse Practitioner

## 2021-09-16 DIAGNOSIS — F419 Anxiety disorder, unspecified: Secondary | ICD-10-CM

## 2021-09-17 MED ORDER — CLONAZEPAM 1 MG PO TABS: 1.0000 mg | ORAL_TABLET | Freq: Two times a day (BID) | ORAL | 0 refills | Status: DC | PRN

## 2021-11-04 ENCOUNTER — Other Ambulatory Visit: Payer: Self-pay | Admitting: Internal Medicine

## 2021-11-04 ENCOUNTER — Telehealth: Payer: Self-pay | Admitting: Nurse Practitioner

## 2021-11-04 DIAGNOSIS — F419 Anxiety disorder, unspecified: Secondary | ICD-10-CM

## 2021-11-04 MED ORDER — CLONAZEPAM 1 MG PO TABS: 1.0000 mg | ORAL_TABLET | Freq: Two times a day (BID) | ORAL | 0 refills | Status: DC | PRN

## 2021-11-04 NOTE — Telephone Encounter (Signed)
1.Medication Requested: clonazePAM (KLONOPIN) 1 MG tablet  2. Pharmacy (Name, Street, [place]):  [phone] - [place], [place] - [address] Phone:  [phone]  Fax:  [phone]      3. On Med List: yes  4. Last Visit with PCP: 09.29.22  5. Next visit date with PCP: 01.13.23   Agent: Please be advised that RX refills may take up to 3 business days. We ask that you follow-up with your pharmacy.

## 2021-11-12 ENCOUNTER — Ambulatory Visit: Payer: No Typology Code available for payment source | Admitting: Nurse Practitioner

## 2021-11-20 ENCOUNTER — Encounter: Payer: Self-pay | Admitting: Nurse Practitioner

## 2021-11-20 ENCOUNTER — Other Ambulatory Visit: Payer: Self-pay

## 2021-11-20 ENCOUNTER — Ambulatory Visit: Payer: No Typology Code available for payment source | Admitting: Nurse Practitioner

## 2021-11-20 VITALS — BP 110/70 | HR 82 | Temp 97.9°F | Ht 60.0 in | Wt 194.0 lb

## 2021-11-20 DIAGNOSIS — D75839 Thrombocytosis, unspecified: Secondary | ICD-10-CM

## 2021-11-20 DIAGNOSIS — R8290 Unspecified abnormal findings in urine: Secondary | ICD-10-CM

## 2021-11-20 DIAGNOSIS — Z1211 Encounter for screening for malignant neoplasm of colon: Secondary | ICD-10-CM | POA: Diagnosis not present

## 2021-11-20 DIAGNOSIS — F419 Anxiety disorder, unspecified: Secondary | ICD-10-CM

## 2021-11-20 DIAGNOSIS — N951 Menopausal and female climacteric states: Secondary | ICD-10-CM

## 2021-11-20 DIAGNOSIS — M069 Rheumatoid arthritis, unspecified: Secondary | ICD-10-CM

## 2021-11-20 LAB — URINALYSIS WITH CULTURE, IF INDICATED
Bilirubin Urine: NEGATIVE
Hgb urine dipstick: NEGATIVE
Ketones, ur: NEGATIVE
Leukocytes,Ua: NEGATIVE
Nitrite: NEGATIVE
Specific Gravity, Urine: 1.02 (ref 1.000–1.030)
Total Protein, Urine: NEGATIVE
Urine Glucose: NEGATIVE
Urobilinogen, UA: 1 (ref 0.0–1.0)
pH: 5.5 (ref 5.0–8.0)

## 2021-11-20 NOTE — Progress Notes (Signed)
Subjective:  Patient ID: Diana Giles, female    DOB: 05/04/1971  Age: 51 y.o. MRN: [id number]  CC:  Chief Complaint  Patient presents with   Follow-up   Other    Cloudy urine, discuss blood work results      HPI  This patient arrives today for the above.  Overall she is feeling well. She reports seeing objects in her urine. She has frequent UTIs, but denies hematuria or dysuria. She does report a history of kidney stones.  She would like to see OBGYN for management of her menopause. I believe she was referred to OBGYN in the past, but would like to see a provider closer to her home.  She is due for colon cancer screening and would like referral to GI to discuss colonoscopy.  Blood work completed at last office visit shows elevated platelets. She tells me she has a longstanding history of this and used to follow with hematology when she lived in [place]. She tells me her platelets have been as high as 700. She does not wish to see hematology again at this time.  She continues to have anxiety, but feels it is currently well controlled on her cymbalta and as needed klonopin.  She is experiencing quite a bit of intermittent pain related to her rheumatoid arthritis and pain in her knee that has been ongoing since she had a fall at a gas station. This is making it hard to stand for prolonged periods of times some days. 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   She is due for her second shingrix vaccine.   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    Mental disorder    Rheumatoid arthritis ([place])    Sinus node dysfunction ([facility])    s/p pacemaker   Thrombocytosis    Vaginal Pap smear, abnormal       Family History  Problem Relation Age of Onset   Hypertension Mother    Stroke Mother     Diabetes Father    Heart disease Father    Hyperthyroidism Sister    COPD Sister    Obesity Brother    Hypertension Brother    Anxiety disorder Brother    Obesity Brother    Diabetes Brother    Asthma Brother    Asthma Brother    Polycystic ovary syndrome Daughter    Asthma Son    Obesity Son    Colon cancer Cousin     Social History   Social History Narrative   Separated for 12 years,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   Social History   Tobacco Use   Smoking status: Never   Smokeless tobacco: Never  Substance Use Topics   Alcohol use: Yes    Comment: Occasionally     Current Meds  Medication Sig   acetaminophen (TYLENOL) 500 MG tablet Take 500 mg by mouth every 6 (six) hours as needed.   amphetamine-dextroamphetamine (ADDERALL XR) 20 MG 24 hr capsule Take 20 mg by mouth daily as needed.   Cholecalciferol (VITAMIN D3) 125 MCG (5000 UT) CAPS Take 2 capsules by mouth in the morning and at bedtime. 2 in AM & 2 in PM. (10,000iu /2xday).  clonazePAM (KLONOPIN) 1 MG tablet Take 1 tablet (1 mg total) by mouth 2 (two) times daily as needed for anxiety.   DULoxetine (CYMBALTA) 60 MG capsule Take 1 capsule (60 mg total) by mouth 2 (two) times daily.   EPINEPHrine 0.3 mg/0.3 mL IJ SOAJ injection Inject 0.3 mg into the muscle as needed for anaphylaxis.   ibuprofen (ADVIL) 200 MG tablet Take 400 mg by mouth every 6 (six) hours as needed.   Nutritional Supplements (DHEA) 15-50 MG CAPS Take 30 mg by mouth daily at 12 noon.   progesterone (PROMETRIUM) 200 MG capsule TAKE 1 CAPSULE (200 MG TOTAL) BY MOUTH DAILY. FOR 21 DAYS THEN OFF FOR 7 DAYS   vitamin B-12 (CYANOCOBALAMIN) 500 MCG tablet Take 500 mcg by mouth daily.   [DISCONTINUED] thyroid (NP THYROID) 30 MG tablet Taper off of this medication. Take 1 tablet by mouth daily x 1 week, then take 1 tablet by mouth every other day x 1-2 weeks, then stop the medication.    ROS:  Review of Systems  Respiratory:  Positive  for shortness of breath (with anxiety attacks).   Cardiovascular:  Negative for chest pain.  Musculoskeletal:  Positive for joint pain.  Psychiatric/Behavioral:  Negative for suicidal ideas. The patient is nervous/anxious.     Objective:   Today's Vitals: BP 110/70 (BP Location: Left Arm, Patient Position: Sitting, Cuff Size: Normal)    Pulse 82    Temp 97.9 F (36.6 C) (Oral)    Ht 5' (1.524 m)    Wt 194 lb (88 kg)    SpO2 97%    BMI 37.89 kg/m  Vitals with BMI 11/20/2021 08/13/2021 08/06/2021  Height 5\' 0"  5\' 0"  5' 0.5"  Weight 194 lbs 194 lbs 195 lbs 6 oz  BMI 37.89 [id number]  Systolic [id number] [id number]  Diastolic 70 70 82  Pulse 82 89 79     Physical Exam Vitals reviewed.  Constitutional:      General: She is not in acute distress.    Appearance: Normal appearance.  HENT:     Head: Normocephalic and atraumatic.  Neck:     Vascular: No carotid bruit.  Cardiovascular:     Rate and Rhythm: Normal rate and regular rhythm.     Pulses: Normal pulses.     Heart sounds: Normal heart sounds.  Pulmonary:     Effort: Pulmonary effort is normal.     Breath sounds: Normal breath sounds.  Skin:    General: Skin is warm and dry.  Neurological:     General: No focal deficit present.     Mental Status: She is alert and oriented to person, place, and time.  Psychiatric:        Mood and Affect: Mood normal.        Behavior: Behavior normal.        Judgment: Judgment normal.         Assessment and Plan   1. Cloudy urine   2. Hot flashes due to menopause   3. Thrombocytosis   4. Colon cancer screening   5. Anxiety   6. Rheumatoid arthritis, involving unspecified site, unspecified whether rheumatoid factor present [facility])      Plan: Will check u/a with reflex to culture. Referral to OBGYN made today Will continue to monitor periodically, may refer to hematology again in the future. Referral to gastroenterology made today She will continue on current medication regimen for  now.  She mentioned applying for FMLA, will fill out  paperwork if needed for this. May also consider referral to rheumatology.   She was told to discuss getting the second shingrix vaccine at her pharmacy, but that she could get this at anytime after 11/23/21.   Tests ordered Orders Placed This Encounter  Procedures   Urinalysis with Culture, if indicated   Ambulatory referral to Obstetrics / Gynecology   Ambulatory referral to Gastroenterology      No orders of the defined types were placed in this encounter.   Patient to follow-up in 6 months or sooner as needed  Ailene Ards, NP

## 2021-11-23 ENCOUNTER — Encounter: Payer: Self-pay | Admitting: Nurse Practitioner

## 2021-11-26 ENCOUNTER — Other Ambulatory Visit: Payer: Self-pay | Admitting: Nurse Practitioner

## 2021-11-26 DIAGNOSIS — M069 Rheumatoid arthritis, unspecified: Secondary | ICD-10-CM

## 2021-12-04 ENCOUNTER — Telehealth: Payer: Self-pay | Admitting: Nurse Practitioner

## 2021-12-04 NOTE — Telephone Encounter (Signed)
Do you know if we ever got FMLA papers for this patient? She mentioned in a mychart message that they were sent to [place], but I haven't seen them.

## 2021-12-04 NOTE — Telephone Encounter (Signed)
Patient calling in  Patient would like for nurse to give her a call back about FMLA forms when available.. says she can come & pick up from the office  Please call [phone]

## 2021-12-08 NOTE — Telephone Encounter (Signed)
Patient is willing to drop paperwork off 2/1 to be completed.

## 2021-12-10 ENCOUNTER — Encounter: Payer: Self-pay | Admitting: Nurse Practitioner

## 2021-12-10 NOTE — Telephone Encounter (Signed)
Type of form received- FMLA ? ?Form placed in- Provider mailbox ? ?Additional instructions from the patient- Notify by phone when completed ? ?Things to remember: ?Front office: If form received in person, remind patient that forms take 7-10 business days ?CMA should attach charge sheet and put on Supervisor's desk  ?

## 2021-12-11 ENCOUNTER — Encounter: Payer: Self-pay | Admitting: Nurse Practitioner

## 2021-12-11 NOTE — Telephone Encounter (Signed)
Pt returning provider's call, pt states she does not remember when she was diagnosed w/ the conditions asked below.  Pt states she will respond to provider on mychart once she has the answers to the questions asked.

## 2021-12-13 ENCOUNTER — Other Ambulatory Visit: Payer: Self-pay | Admitting: Internal Medicine

## 2021-12-13 DIAGNOSIS — F419 Anxiety disorder, unspecified: Secondary | ICD-10-CM

## 2021-12-14 ENCOUNTER — Other Ambulatory Visit: Payer: Self-pay | Admitting: Internal Medicine

## 2021-12-14 DIAGNOSIS — F419 Anxiety disorder, unspecified: Secondary | ICD-10-CM

## 2021-12-17 MED ORDER — CLONAZEPAM 1 MG PO TABS: 1.0000 mg | ORAL_TABLET | Freq: Two times a day (BID) | ORAL | 0 refills | Status: DC | PRN

## 2021-12-17 NOTE — Telephone Encounter (Signed)
Sent patient a Clinical cytogeneticistmychart message that a copy of the forms is available up front. Copy faxed and sent to scan.

## 2021-12-20 ENCOUNTER — Encounter: Payer: Self-pay | Admitting: Nurse Practitioner

## 2021-12-30 ENCOUNTER — Encounter: Payer: Self-pay | Admitting: Cardiology

## 2021-12-31 ENCOUNTER — Ambulatory Visit (INDEPENDENT_AMBULATORY_CARE_PROVIDER_SITE_OTHER): Payer: No Typology Code available for payment source

## 2021-12-31 DIAGNOSIS — I495 Sick sinus syndrome: Secondary | ICD-10-CM | POA: Diagnosis not present

## 2021-12-31 LAB — CUP PACEART REMOTE DEVICE CHECK
Battery Remaining Longevity: 166 mo
Battery Voltage: 3.07 V
Brady Statistic AP VP Percent: 0.05 %
Brady Statistic AP VS Percent: 7.45 %
Brady Statistic AS VP Percent: 0.04 %
Brady Statistic AS VS Percent: 92.47 %
Brady Statistic RA Percent Paced: 7.51 %
Brady Statistic RV Percent Paced: 0.09 %
Date Time Interrogation Session: 20230223001817
Implantable Lead Implant Date: 20210928
Implantable Lead Implant Date: 20210928
Implantable Lead Location: 753859
Implantable Lead Location: 753860
Implantable Lead Model: 4076
Implantable Lead Model: 4076
Implantable Pulse Generator Implant Date: 20210928
Lead Channel Impedance Value: 304 Ohm
Lead Channel Impedance Value: 361 Ohm
Lead Channel Impedance Value: 418 Ohm
Lead Channel Impedance Value: 532 Ohm
Lead Channel Pacing Threshold Amplitude: 0.625 V
Lead Channel Pacing Threshold Amplitude: 1.125 V
Lead Channel Pacing Threshold Pulse Width: 0.4 ms
Lead Channel Pacing Threshold Pulse Width: 0.4 ms
Lead Channel Sensing Intrinsic Amplitude: 2.375 mV
Lead Channel Sensing Intrinsic Amplitude: 2.375 mV
Lead Channel Sensing Intrinsic Amplitude: 7 mV
Lead Channel Sensing Intrinsic Amplitude: 7 mV
Lead Channel Setting Pacing Amplitude: 2 V
Lead Channel Setting Pacing Amplitude: 2.25 V
Lead Channel Setting Pacing Pulse Width: 0.4 ms
Lead Channel Setting Sensing Sensitivity: 0.9 mV

## 2022-01-05 NOTE — Progress Notes (Signed)
Remote pacemaker transmission.   

## 2022-01-13 ENCOUNTER — Encounter: Payer: Self-pay | Admitting: Cardiology

## 2022-01-13 ENCOUNTER — Other Ambulatory Visit: Payer: Self-pay

## 2022-01-13 ENCOUNTER — Ambulatory Visit (INDEPENDENT_AMBULATORY_CARE_PROVIDER_SITE_OTHER): Payer: No Typology Code available for payment source | Admitting: Cardiology

## 2022-01-13 VITALS — BP 110/84 | HR 64 | Ht 60.0 in | Wt 197.0 lb

## 2022-01-13 DIAGNOSIS — I495 Sick sinus syndrome: Secondary | ICD-10-CM

## 2022-01-13 DIAGNOSIS — Z950 Presence of cardiac pacemaker: Secondary | ICD-10-CM

## 2022-01-13 LAB — CUP PACEART INCLINIC DEVICE CHECK
Battery Remaining Longevity: 165 mo
Battery Voltage: 3.07 V
Brady Statistic AP VP Percent: 0.05 %
Brady Statistic AP VS Percent: 7.36 %
Brady Statistic AS VP Percent: 0.04 %
Brady Statistic AS VS Percent: 92.55 %
Brady Statistic RA Percent Paced: 7.43 %
Brady Statistic RV Percent Paced: 0.09 %
Date Time Interrogation Session: 20230308162953
Implantable Lead Implant Date: 20210928
Implantable Lead Implant Date: 20210928
Implantable Lead Location: 753859
Implantable Lead Location: 753860
Implantable Lead Model: 4076
Implantable Lead Model: 4076
Implantable Pulse Generator Implant Date: 20210928
Lead Channel Impedance Value: 285 Ohm
Lead Channel Impedance Value: 361 Ohm
Lead Channel Impedance Value: 437 Ohm
Lead Channel Impedance Value: 513 Ohm
Lead Channel Pacing Threshold Amplitude: 0.625 V
Lead Channel Pacing Threshold Amplitude: 1.25 V
Lead Channel Pacing Threshold Pulse Width: 0.4 ms
Lead Channel Pacing Threshold Pulse Width: 0.4 ms
Lead Channel Sensing Intrinsic Amplitude: 0.875 mV
Lead Channel Sensing Intrinsic Amplitude: 3.125 mV
Lead Channel Sensing Intrinsic Amplitude: 7.125 mV
Lead Channel Sensing Intrinsic Amplitude: 8.25 mV
Lead Channel Setting Pacing Amplitude: 2 V
Lead Channel Setting Pacing Amplitude: 2.5 V
Lead Channel Setting Pacing Pulse Width: 0.4 ms
Lead Channel Setting Sensing Sensitivity: 0.9 mV

## 2022-01-13 NOTE — Patient Instructions (Addendum)
Medications: ?Your physician recommends that you continue on your current medications as directed. Please refer to the Current Medication list given to you today. ?*If you need a refill on your cardiac medications before your next appointment, please call your pharmacy* ? ?Lab Work: ?None. ?If you have labs (blood work) drawn today and your tests are completely normal, you will receive your results only by: ?MyChart Message (if you have MyChart) OR ?A paper copy in the mail ?If you have any lab test that is abnormal or we need to change your treatment, we will call you to review the results. ? ?Testing/Procedures: ?None. ? ?Follow-Up: ?At [facility], you and your health needs are our priority.  As part of our continuing mission to provide you with exceptional heart care, we have created designated Provider Care Teams.  These Care Teams include your primary Cardiologist (physician) and Advanced Practice Providers (APPs -  Physician Assistants and Nurse Practitioners) who all work together to provide you with the care you need, when you need it. ? ?Your physician wants you to follow-up in: 12 months with Steffanie Dunn  ? ?We recommend signing up for the patient portal called "MyChart".  Sign up information is provided on this After Visit Summary.  MyChart is used to connect with patients for Virtual Visits (Telemedicine).  Patients are able to view lab/test results, encounter notes, upcoming appointments, etc.  Non-urgent messages can be sent to your provider as well.   ?To learn more about what you can do with MyChart, go to [email or web address].   ? ?Any Other Special Instructions Will Be Listed Below (If Applicable).  ?

## 2022-01-13 NOTE — Progress Notes (Signed)
?Electrophysiology Office Follow up Visit Note:   ? ?Date:  01/13/2022  ? ?ID:  Diana Giles, DOB 1971/06/13, MRN [id number] ? ?PCP:  [name], NP  ?[facility] Cardiologist:  None  ?[facility] Electrophysiologist:  [name], MD  ? ? ?Interval History:   ? ?Diana Giles is a 51 y.o. female who presents for a follow up visit. They were last seen in clinic January 07, 2021.  She has been doing well since I last saw her.  No arrhythmic syncope since her last visit.  She did recently fall but this was mechanical in nature while she was getting out of a car and tripped. ? ? ?  ? ?Past Medical History:  ?Diagnosis Date  ? Anxiety   ? Avascular necrosis of hip (HCC)   ? Bunion of left foot   ? COVID-19   ? DDD (degenerative disc disease), lumbar   ? Fibromyalgia   ? Herpes simplex virus (HSV) infection   ? HPV in female   ? Mental disorder   ? Rheumatoid arthritis (HCC)   ? Sinus node dysfunction (HCC)   ? s/p pacemaker  ? Thrombocytosis   ? Vaginal Pap smear, abnormal   ? ? ?Past Surgical History:  ?Procedure Laterality Date  ? GASTRIC BYPASS  2010  ? 285lbs at highest weight  ? HYSTEROSCOPY WITH D & C  08/22/2019  ? OTHER SURGICAL HISTORY  08/2019  ? Bilateral fallopian tube removal  ? PACEMAKER PLACEMENT  08/06/2020  ? REVISION TOTAL KNEE ARTHROPLASTY Right 06/01/2019  ? SHOULDER ARTHROSCOPY Left 2018  ? TUBAL LIGATION Bilateral 08/07/2019  ? ? ?Current Medications: ?Current Meds  ?Medication Sig  ? acetaminophen (TYLENOL) 500 MG tablet Take 500 mg by mouth every 6 (six) hours as needed.  ? amphetamine-dextroamphetamine (ADDERALL XR) 20 MG 24 hr capsule Take 20 mg by mouth daily as needed.  ? Cholecalciferol (VITAMIN D3) 125 MCG (5000 UT) CAPS Take 2 capsules by mouth in the morning and at bedtime. 2 in AM & 2 in PM. (10,000iu /2xday).  ? clonazePAM (KLONOPIN) 1 MG tablet Take 1 tablet (1 mg total) by mouth 2 (two) times daily as needed for anxiety.  ? DULoxetine (CYMBALTA) 60 MG capsule Take 1  capsule (60 mg total) by mouth 2 (two) times daily.  ? EPINEPHrine 0.3 mg/0.3 mL IJ SOAJ injection Inject 0.3 mg into the muscle as needed for anaphylaxis.  ? ibuprofen (ADVIL) 200 MG tablet Take 400 mg by mouth every 6 (six) hours as needed.  ? meloxicam (MOBIC) 15 MG tablet Take 15 mg by mouth daily as needed for pain.  ? Nutritional Supplements (DHEA) 15-50 MG CAPS Take 30 mg by mouth daily at 12 noon.  ? progesterone (PROMETRIUM) 200 MG capsule TAKE 1 CAPSULE (200 MG TOTAL) BY MOUTH DAILY. FOR 21 DAYS THEN OFF FOR 7 DAYS  ? vitamin B-12 (CYANOCOBALAMIN) 500 MCG tablet Take 500 mcg by mouth daily.  ?  ? ?Allergies:   Nickel, Penicillins, Shellfish allergy, and Sulfa antibiotics  ? ?Social History  ? ?Socioeconomic History  ? Marital status: Legally Separated  ?  Spouse name: Not on file  ? Number of children: 2  ? Years of education: Not on file  ? Highest education level: Not on file  ?Occupational History  ? Not on file  ?Tobacco Use  ? Smoking status: Never  ? Smokeless tobacco: Never  ?Vaping Use  ? Vaping Use: Never used  ?Substance and Sexual Activity  ?  Alcohol use: Yes  ?  Comment: Occasionally  ? Drug use: Never  ? Sexual activity: Not Currently  ?  Birth control/protection: Surgical  ?  Comment: tubal  ?Other Topics Concern  ? Not on file  ?Social History Narrative  ? 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  ? ?Social Determinants of Health  ? ?Financial Resource Strain: Medium Risk  ? Difficulty of Paying Living Expenses: Somewhat hard  ?Food Insecurity: No Food Insecurity  ? Worried About Programme researcher, broadcasting/film/videounning Out of Food in the Last Year: Never true  ? Ran Out of Food in the Last Year: Never true  ?Transportation Needs: No Transportation Needs  ? Lack of Transportation (Medical): No  ? Lack of Transportation (Non-Medical): No  ?Physical Activity: Inactive  ? Days of Exercise per Week: 0 days  ? Minutes of Exercise per Session: 30 min  ?Stress: Stress Concern Present  ? Feeling of  Stress : To some extent  ?Social Connections: Socially Isolated  ? Frequency of Communication with Friends and Family: Three times a week  ? Frequency of Social Gatherings with Friends and Family: Twice a week  ? Attends Religious Services: Never  ? Active Member of Clubs or Organizations: No  ? Attends BankerClub or Organization Meetings: Never  ? Marital Status: Separated  ?  ? ?Family History: ?The patient's family history includes Anxiety disorder in her brother; Asthma in her brother, brother, and son; COPD in her sister; Colon cancer in her cousin; Diabetes in her brother and father; Heart disease in her father; Hypertension in her brother and mother; Hyperthyroidism in her sister; Obesity in her brother, brother, and son; Polycystic ovary syndrome in her daughter; Stroke in her mother. ? ?ROS:   ?Please see the history of present illness.    ?All other systems reviewed and are negative. ? ?EKGs/Labs/Other Studies Reviewed:   ? ?The following studies were reviewed today: ? ?January 13, 2022 in clinic device interrogation personally reviewed ?Lead parameters stable ?Longevity 13.7 years ?7.4% atrial pacing ?Less than 0.1% ventricular pacing ? ?EKG:  The ekg ordered today demonstrates sinus rhythm. ? ?Recent Labs: ?03/03/2021: TSH 0.42 ?08/06/2021: ALT 10; BUN 11; Creatinine, Ser 0.68; Hemoglobin 11.5; Platelets 410.0; Potassium 3.9; Sodium 141  ?Recent Lipid Panel ?   ?Component Value Date/Time  ? CHOL 182 11/17/2020 1339  ? TRIG 115 11/17/2020 1339  ? HDL 83 11/17/2020 1339  ? CHOLHDL 2.2 11/17/2020 1339  ? LDLCALC 79 11/17/2020 1339  ? ? ?Physical Exam:   ? ?VS:  BP 110/84 (BP Location: Left Arm, Patient Position: Sitting, Cuff Size: Large)   Pulse 64   Ht 5' (1.524 m)   Wt 197 lb (89.4 kg)   SpO2 97%   BMI 38.47 kg/m?    ? ?Wt Readings from Last 3 Encounters:  ?01/13/22 197 lb (89.4 kg)  ?11/20/21 194 lb (88 kg)  ?08/13/21 194 lb (88 kg)  ?  ? ?GEN:  Well nourished, well developed in no acute distress ?HEENT:  Normal ?NECK: No JVD; No carotid bruits ?LYMPHATICS: No lymphadenopathy ?CARDIAC: RRR, no murmurs, rubs, gallops.  Pacemaker pocket well-healed ?RESPIRATORY:  Clear to auscultation without rales, wheezing or rhonchi  ?ABDOMEN: Soft, non-tender, non-distended ?MUSCULOSKELETAL:  No edema; No deformity  ?SKIN: Warm and dry ?NEUROLOGIC:  Alert and oriented x 3 ?PSYCHIATRIC:  Normal affect  ? ? ? ?  ? ?ASSESSMENT:   ? ?1. Sinus node dysfunction (HCC)   ?2. Pacemaker   ? ?PLAN:   ? ?  In order of problems listed above: ? ?#Sinus node dysfunction ?#Pacemaker in situ ? ?Device functioning appropriately.  Continue remote monitoring.  I will plan to see her back in 1 year or sooner as needed. ? ? ?Medication Adjustments/Labs and Tests Ordered: ?Current medicines are reviewed at length with the patient today.  Concerns regarding medicines are outlined above.  ?Orders Placed This Encounter  ?Procedures  ? CUP PACEART INCLINIC DEVICE CHECK  ? ?No orders of the defined types were placed in this encounter. ? ? ? ?Signed, ?Steffanie Dunn, MD, [facility], [facility] ?01/13/2022 4:44 PM    ?Electrophysiology ?[facility] ?

## 2022-01-20 ENCOUNTER — Other Ambulatory Visit: Payer: Self-pay | Admitting: Nurse Practitioner

## 2022-01-20 DIAGNOSIS — F419 Anxiety disorder, unspecified: Secondary | ICD-10-CM

## 2022-01-23 ENCOUNTER — Other Ambulatory Visit: Payer: Self-pay | Admitting: Obstetrics & Gynecology

## 2022-01-23 ENCOUNTER — Other Ambulatory Visit: Payer: Self-pay | Admitting: Nurse Practitioner

## 2022-01-23 DIAGNOSIS — F419 Anxiety disorder, unspecified: Secondary | ICD-10-CM

## 2022-01-25 ENCOUNTER — Telehealth: Payer: Self-pay | Admitting: Gastroenterology

## 2022-01-25 NOTE — Telephone Encounter (Signed)
Good afternoon, Dr. Christella HartiganJacobs, ? ?(DoD 12/22/21, p.m.) ? ?This patient is requesting a transfer of care.  Her [place] colon was [place]01/15 in [place] and she has relocated here.  I am sending you her records through [employer].  Please let me know if you approve the transfer. ? ?Thank you. ?

## 2022-01-25 NOTE — Telephone Encounter (Signed)
I put her records from [place] in [employer].  Britta MccreedyBarbara should be bringing them to you sometime this afternoon or in the morning. ?

## 2022-01-25 NOTE — Telephone Encounter (Signed)
LOV: 11/20/2021 ?Next OV: 05/21/2022 ? ?

## 2022-01-26 MED ORDER — CLONAZEPAM 1 MG PO TABS: 1.0000 mg | ORAL_TABLET | Freq: Two times a day (BID) | ORAL | 0 refills | Status: DC | PRN

## 2022-02-02 ENCOUNTER — Encounter: Payer: Self-pay | Admitting: Internal Medicine

## 2022-02-02 NOTE — Progress Notes (Signed)
? ? ?Subjective:  ? ? Patient ID: Diana Giles, female    DOB: [birth year], 51 y.o.   MRN: [id number] ? ?This visit occurred during the SARS-CoV-2 public health emergency.  Safety protocols were in place, including screening questions prior to the visit, additional usage of staff PPE, and extensive cleaning of exam room while observing appropriate contact time as indicated for disinfecting solutions. ? ? ? ?HPI ?Diana Giles is here for  ?Chief Complaint  ?Patient presents with  ? Abdominal Pain  ?  Abdominal pain (left side); chills off and on; fever note (as early as this morning), body aches off and on  ? ? ?Her symptoms started Saturday night ? ?She is experiencing chills, ringing in her ears and a headache initially.  Temp was 101.  She intermittently feels fine then has chills, headache, mild nausea.  Her urine is darker and she has been drinking a lot of fluids.  She has a h/o kidney stones.  She has left sided abdomen bloating, discomfort.  Her abdominal symptoms are intermittent.  She had two episodes of thinking she was passing gas and had fecal incontinence.   ? ? ? ?COVID test yesterday negative.   ? ? ?Medications and allergies reviewed with patient and updated if appropriate. ? ?Current Outpatient Medications on File Prior to Visit  ?Medication Sig Dispense Refill  ? acetaminophen (TYLENOL) 500 MG tablet Take 500 mg by mouth every 6 (six) hours as needed.    ? amphetamine-dextroamphetamine (ADDERALL XR) 20 MG 24 hr capsule Take 20 mg by mouth daily as needed.    ? Cholecalciferol (VITAMIN D3) 125 MCG (5000 UT) CAPS Take 2 capsules by mouth in the morning and at bedtime. 2 in AM & 2 in PM. (10,000iu /2xday).    ? clonazePAM (KLONOPIN) 1 MG tablet Take 1 tablet (1 mg total) by mouth 2 (two) times daily as needed for anxiety. 60 tablet 0  ? DULoxetine (CYMBALTA) 60 MG capsule Take 1 capsule (60 mg total) by mouth 2 (two) times daily. 180 capsule 1  ? EPINEPHrine 0.3 mg/0.3 mL IJ SOAJ injection Inject 0.3 mg  into the muscle as needed for anaphylaxis. 1 each 2  ? ibuprofen (ADVIL) 200 MG tablet Take 400 mg by mouth every 6 (six) hours as needed.    ? meloxicam (MOBIC) 15 MG tablet Take 15 mg by mouth daily as needed for pain.    ? Nutritional Supplements (DHEA) 15-50 MG CAPS Take 30 mg by mouth daily at 12 noon.    ? progesterone (PROMETRIUM) 200 MG capsule TAKE 1 CAPSULE (200 MG TOTAL) BY MOUTH DAILY. FOR 21 DAYS THEN OFF FOR 7 DAYS 90 capsule 1  ? vitamin B-12 (CYANOCOBALAMIN) 500 MCG tablet Take 500 mcg by mouth daily.    ? ?No current facility-administered medications on file prior to visit.  ? ? ?Review of Systems  ?Constitutional:  Positive for chills and fever.  ?Gastrointestinal:  Positive for abdominal distention (left sided), abdominal pain (left sided), constipation, diarrhea and nausea.  ?Genitourinary:  Positive for frequency and urgency (chronic). Negative for dysuria and hematuria.  ?     Dark urine  ?Musculoskeletal:  Positive for myalgias (achy all over). Negative for back pain.  ?Neurological:  Positive for headaches.  ? ?   ?Objective:  ? ?Vitals:  ? 02/03/22 0838  ?BP: 106/80  ?Pulse: 88  ?Temp: 98.7 ?F (37.1 ?C)  ?SpO2: 97%  ? ?BP Readings from Last 3 Encounters:  ?02/03/22 106/80  ?01/13/22 110/84  ?  11/20/21 110/70  ? ?Wt Readings from Last 3 Encounters:  ?02/03/22 195 lb 3.2 oz (88.5 kg)  ?01/13/22 197 lb (89.4 kg)  ?11/20/21 194 lb (88 kg)  ? ?Body mass index is 38.12 kg/m?. ? ?  ?Physical Exam ?Constitutional:   ?   General: She is not in acute distress. ?   Appearance: Normal appearance.  ?HENT:  ?   Head: Normocephalic and atraumatic.  ?Eyes:  ?   Conjunctiva/sclera: Conjunctivae normal.  ?Cardiovascular:  ?   Rate and Rhythm: Normal rate and regular rhythm.  ?   Heart sounds: Normal heart sounds. No murmur heard. ?Pulmonary:  ?   Effort: Pulmonary effort is normal. No respiratory distress.  ?   Breath sounds: Normal breath sounds. No wheezing.  ?Abdominal:  ?   Tenderness: There is abdominal  tenderness in the left lower quadrant. There is no right CVA tenderness, left CVA tenderness, guarding or rebound.  ?Musculoskeletal:  ?   Cervical back: Neck supple.  ?   Right lower leg: No edema.  ?   Left lower leg: No edema.  ?Lymphadenopathy:  ?   Cervical: No cervical adenopathy.  ?Skin: ?   General: Skin is warm and dry.  ?   Findings: No rash.  ?Neurological:  ?   Mental Status: She is alert. Mental status is at baseline.  ?Psychiatric:     ?   Mood and Affect: Mood normal.     ?   Behavior: Behavior normal.  ? ?   ? ? ? ? ? ?Assessment & Plan:  ? ? ?Left sided abdominal pain, nausea, darker urine, microscopic hematuria, fever: ?Acute ?Symptoms started 3 days ago ?Concern for kidney stone, which she has had in the past, urinary tract infection, diverticulitis ?Urine dip here has blood, but no other evidence of an infection ?CT renal scan did not reveal any kidney stones/ureteral lithiasis and bowel was normal ?Zofran 4 mg every 8 hours as needed prescribed for nausea ?Continue increased fluids ?CBC, CMP unremarkable ?Symptomatic treatment for now to see if symptoms improve-symptoms could be related to gastroenteritis ? ? ? ? ? ?

## 2022-02-03 ENCOUNTER — Other Ambulatory Visit: Payer: Self-pay

## 2022-02-03 ENCOUNTER — Ambulatory Visit (INDEPENDENT_AMBULATORY_CARE_PROVIDER_SITE_OTHER)
Admission: RE | Admit: 2022-02-03 | Discharge: 2022-02-03 | Disposition: A | Discharge status: Home / Self Care | Payer: No Typology Code available for payment source | Source: Ambulatory Visit | Attending: Internal Medicine | Admitting: Internal Medicine

## 2022-02-03 ENCOUNTER — Telehealth: Payer: Self-pay | Admitting: Internal Medicine

## 2022-02-03 ENCOUNTER — Ambulatory Visit: Payer: No Typology Code available for payment source | Admitting: Internal Medicine

## 2022-02-03 VITALS — BP 106/80 | HR 88 | Temp 98.7°F | Ht 60.0 in | Wt 195.2 lb

## 2022-02-03 DIAGNOSIS — R82998 Other abnormal findings in urine: Secondary | ICD-10-CM

## 2022-02-03 DIAGNOSIS — R300 Dysuria: Secondary | ICD-10-CM

## 2022-02-03 DIAGNOSIS — R1032 Left lower quadrant pain: Secondary | ICD-10-CM | POA: Diagnosis not present

## 2022-02-03 DIAGNOSIS — R3129 Other microscopic hematuria: Secondary | ICD-10-CM

## 2022-02-03 DIAGNOSIS — R110 Nausea: Secondary | ICD-10-CM | POA: Diagnosis not present

## 2022-02-03 DIAGNOSIS — R509 Fever, unspecified: Secondary | ICD-10-CM

## 2022-02-03 LAB — CBC WITH DIFFERENTIAL/PLATELET
Basophils Absolute: 0 10*3/uL (ref 0.0–0.1)
Basophils Relative: 0.4 % (ref 0.0–3.0)
Eosinophils Absolute: 0 10*3/uL (ref 0.0–0.7)
Eosinophils Relative: 0.2 % (ref 0.0–5.0)
HCT: 35.3 % — ABNORMAL LOW (ref 36.0–46.0)
Hemoglobin: 11.8 g/dL — ABNORMAL LOW (ref 12.0–15.0)
Lymphocytes Relative: 16.4 % (ref 12.0–46.0)
Lymphs Abs: 1.7 10*3/uL (ref 0.7–4.0)
MCHC: 33.3 g/dL (ref 30.0–36.0)
MCV: 88.5 fl (ref 78.0–100.0)
Monocytes Absolute: 1.1 10*3/uL — ABNORMAL HIGH (ref 0.1–1.0)
Monocytes Relative: 10.8 % (ref 3.0–12.0)
Neutro Abs: 7.5 10*3/uL (ref 1.4–7.7)
Neutrophils Relative %: 72.2 % (ref 43.0–77.0)
Platelets: 376 10*3/uL (ref 150.0–400.0)
RBC: 3.99 Mil/uL (ref 3.87–5.11)
RDW: 15 % (ref 11.5–15.5)
WBC: 10.4 10*3/uL (ref 4.0–10.5)

## 2022-02-03 LAB — COMPREHENSIVE METABOLIC PANEL
ALT: 21 U/L (ref 0–35)
AST: 21 U/L (ref 0–37)
Albumin: 3.9 g/dL (ref 3.5–5.2)
Alkaline Phosphatase: 116 U/L (ref 39–117)
BUN: 10 mg/dL (ref 6–23)
CO2: 26 mEq/L (ref 19–32)
Calcium: 9 mg/dL (ref 8.4–10.5)
Chloride: 103 mEq/L (ref 96–112)
Creatinine, Ser: 0.73 mg/dL (ref 0.40–1.20)
GFR: 94.26 mL/min (ref >60.00)
Glucose, Bld: 87 mg/dL (ref 70–99)
Potassium: 3.9 mEq/L (ref 3.5–5.1)
Sodium: 136 mEq/L (ref 135–145)
Total Bilirubin: 0.6 mg/dL (ref 0.2–1.2)
Total Protein: 7.3 g/dL (ref 6.0–8.3)

## 2022-02-03 LAB — POC URINALSYSI DIPSTICK (AUTOMATED)
Bilirubin, UA: NEGATIVE
Glucose, UA: NEGATIVE
Ketones, UA: NEGATIVE
Leukocytes, UA: NEGATIVE
Nitrite, UA: NEGATIVE
Protein, UA: NEGATIVE
Spec Grav, UA: 1.015 (ref 1.010–1.025)
Urobilinogen, UA: 1 E.U./dL
pH, UA: 6 (ref 5.0–8.0)

## 2022-02-03 MED ORDER — ONDANSETRON HCL 4 MG PO TABS: 4.0000 mg | ORAL_TABLET | Freq: Three times a day (TID) | ORAL | 0 refills | Status: DC | PRN

## 2022-02-03 NOTE — Telephone Encounter (Signed)
Results given to patient today. 

## 2022-02-03 NOTE — Patient Instructions (Signed)
? ? ? ?  Blood work was ordered.   ? ? ?Medications changes include :  zofran every 8 hrs   ? ? ?Your prescription(s) have been sent to your pharmacy.  ? ? ?A Ct scan was ordered     Someone from that office will call you to schedule an appointment.  ? ?

## 2022-02-03 NOTE — Telephone Encounter (Signed)
Blood work shows minimal anemia that is improved since last September.  Platelet count is normal.  Any function and liver tests are normal. ? ?CT scan results-no kidney stones.  No stones in bladder.  Your appendix is normal.  Your bowel is normal.  No explanation for your pain with the CT scan.  1 incidental finding is in the bone of your right upper leg-there is a benign bone lesion.  If you are having pain in the hip we can evaluate further, but if you are not no further evaluation is needed. ?

## 2022-02-06 LAB — CULTURE, URINE COMPREHENSIVE

## 2022-02-06 MED ORDER — NITROFURANTOIN MONOHYD MACRO 100 MG PO CAPS: 100.0000 mg | ORAL_CAPSULE | Freq: Two times a day (BID) | ORAL | 0 refills | Status: DC

## 2022-02-06 NOTE — Addendum Note (Signed)
Addended by: Pincus Sanes on: 02/06/2022 08:54 AM ? ? Modules accepted: Orders ? ?

## 2022-02-28 ENCOUNTER — Other Ambulatory Visit: Payer: Self-pay | Admitting: Obstetrics & Gynecology

## 2022-03-03 ENCOUNTER — Other Ambulatory Visit: Payer: Self-pay | Admitting: Nurse Practitioner

## 2022-03-03 DIAGNOSIS — F419 Anxiety disorder, unspecified: Secondary | ICD-10-CM

## 2022-03-08 ENCOUNTER — Other Ambulatory Visit: Payer: Self-pay | Admitting: Nurse Practitioner

## 2022-03-08 ENCOUNTER — Encounter: Payer: Self-pay | Admitting: Gastroenterology

## 2022-03-08 DIAGNOSIS — F419 Anxiety disorder, unspecified: Secondary | ICD-10-CM

## 2022-03-09 MED ORDER — CLONAZEPAM 1 MG PO TABS: 1.0000 mg | ORAL_TABLET | Freq: Two times a day (BID) | ORAL | 0 refills | Status: DC | PRN

## 2022-03-18 ENCOUNTER — Other Ambulatory Visit: Payer: Self-pay

## 2022-03-18 ENCOUNTER — Encounter: Payer: Self-pay | Admitting: Obstetrics & Gynecology

## 2022-03-18 MED ORDER — VALACYCLOVIR HCL 500 MG PO TABS: 500.0000 mg | ORAL_TABLET | Freq: Two times a day (BID) | ORAL | 11 refills | Status: DC

## 2022-03-30 ENCOUNTER — Ambulatory Visit: Payer: No Typology Code available for payment source | Admitting: Gastroenterology

## 2022-03-30 NOTE — Progress Notes (Deleted)
   Colonoscopy 08/2015 [facility] endoscopy Center indication iron deficiency anemia.  Findings external hemorrhoids, normal TI that was biopsied.  Diverticulosis in the left colon.  Pathology showed normal terminal ileum the prep was good, appears to be a high quality examination.  The physician recommended repeat colonoscopy in 10 years.   EGD 08/2015 [facility] endoscopy Center indication iron deficiency anemia, left upper quadrant pain.  The examination was essentially normal.  Biopsies were taken from the jejunum for celiac disease and the pathology was normal.    Lab work 01/2022 UA and urine culture was positive for E. coli UTI, complete metabolic profile was normal, CBC was normal except for hemoglobin 11.9.   CT scan "renal stone study" 01/2022: 1. No nephrolithiasis, ureterolithiasis or obstructive uropathy. 2. Mo bladder calculi. 3. Normal appendix. 4. Benign-appearing lucent lesion in the proximal RIGHT femur. Potential fibrous dysplasia. No further evaluation recommended unless pain in RIGHT hip. If symptomatic , consider MRI of the RIGHT hip.

## 2022-04-01 ENCOUNTER — Ambulatory Visit (INDEPENDENT_AMBULATORY_CARE_PROVIDER_SITE_OTHER): Payer: No Typology Code available for payment source

## 2022-04-01 DIAGNOSIS — I495 Sick sinus syndrome: Secondary | ICD-10-CM | POA: Diagnosis not present

## 2022-04-01 LAB — CUP PACEART REMOTE DEVICE CHECK
Battery Remaining Longevity: 161 mo
Battery Voltage: 3.05 V
Brady Statistic AP VP Percent: 0.03 %
Brady Statistic AP VS Percent: 5.85 %
Brady Statistic AS VP Percent: 0.04 %
Brady Statistic AS VS Percent: 94.08 %
Brady Statistic RA Percent Paced: 5.9 %
Brady Statistic RV Percent Paced: 0.06 %
Date Time Interrogation Session: 20230525002538
Implantable Lead Implant Date: 20210928
Implantable Lead Implant Date: 20210928
Implantable Lead Location: 753859
Implantable Lead Location: 753860
Implantable Lead Model: 4076
Implantable Lead Model: 4076
Implantable Pulse Generator Implant Date: 20210928
Lead Channel Impedance Value: 304 Ohm
Lead Channel Impedance Value: 361 Ohm
Lead Channel Impedance Value: 437 Ohm
Lead Channel Impedance Value: 551 Ohm
Lead Channel Pacing Threshold Amplitude: 0.5 V
Lead Channel Pacing Threshold Amplitude: 1.375 V
Lead Channel Pacing Threshold Pulse Width: 0.4 ms
Lead Channel Pacing Threshold Pulse Width: 0.4 ms
Lead Channel Sensing Intrinsic Amplitude: 2.875 mV
Lead Channel Sensing Intrinsic Amplitude: 2.875 mV
Lead Channel Sensing Intrinsic Amplitude: 6.875 mV
Lead Channel Sensing Intrinsic Amplitude: 6.875 mV
Lead Channel Setting Pacing Amplitude: 2 V
Lead Channel Setting Pacing Amplitude: 2.75 V
Lead Channel Setting Pacing Pulse Width: 0.4 ms
Lead Channel Setting Sensing Sensitivity: 0.9 mV

## 2022-04-02 ENCOUNTER — Encounter: Payer: Self-pay | Admitting: Nurse Practitioner

## 2022-04-08 ENCOUNTER — Telehealth (INDEPENDENT_AMBULATORY_CARE_PROVIDER_SITE_OTHER): Payer: No Typology Code available for payment source | Admitting: Nurse Practitioner

## 2022-04-08 DIAGNOSIS — R5383 Other fatigue: Secondary | ICD-10-CM | POA: Diagnosis not present

## 2022-04-08 DIAGNOSIS — W57XXXA Bitten or stung by nonvenomous insect and other nonvenomous arthropods, initial encounter: Secondary | ICD-10-CM | POA: Diagnosis not present

## 2022-04-08 DIAGNOSIS — S0006XA Insect bite (nonvenomous) of scalp, initial encounter: Secondary | ICD-10-CM | POA: Diagnosis not present

## 2022-04-08 MED ORDER — DOXYCYCLINE HYCLATE 100 MG PO TABS: 100.0000 mg | ORAL_TABLET | Freq: Two times a day (BID) | ORAL | 0 refills | Status: DC

## 2022-04-08 NOTE — Progress Notes (Signed)
An audio-only tele-health visit was completed today for this patient. I connected with  Diana Giles on 04/08/22 utilizing audio-only technology and verified that I am speaking with the correct person using two identifiers. The patient was located at their place of employment, and I was located at the office of [facility] during the encounter. I discussed the limitations of evaluation and management by telemedicine. The patient expressed understanding and agreed to proceed.     Subjective:  Patient ID: Diana Giles, female    DOB: 14-Dec-1970  Age: 51 y.o. MRN: [id number]  CC:  Chief Complaint  Patient presents with   Fatigue      HPI  This patient arrives today for virtual visit for the above.  Symptoms started a couple weeks ago after she found a tick on her scalp while she was visiting family in [place].  This was removed and she has not noted any rash but she feels her fatigue has worsened.  She has a history of rheumatoid arthritis as well as fibromyalgia.  She is due to follow-up with a rheumatologist but has not yet seen them.  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  Iron level is 57, ferritin low at 8, iron binding capacity high at 459.  Platelet count was elevated at 449 (she has chronic thrombocytosis).  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    Mental disorder    Rheumatoid arthritis ([place])    Sinus node dysfunction (HCC)    s/p pacemaker   Thrombocytosis    Vaginal Pap smear, abnormal       Family History  Problem Relation Age of Onset   Hypertension Mother    Stroke Mother    Diabetes Father    Heart disease Father    Hyperthyroidism Sister    COPD Sister     Obesity Brother    Hypertension Brother    Anxiety disorder Brother    Obesity Brother    Diabetes Brother    Asthma Brother    Asthma Brother    Polycystic ovary syndrome Daughter    Asthma Son    Obesity Son    Colon cancer Cousin     Social History   Social History Narrative   Separated for 12 years,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   Social History   Tobacco Use   Smoking status: Never   Smokeless tobacco: Never  Substance Use Topics   Alcohol use: Yes    Comment: Occasionally     Current Meds  Medication Sig   doxycycline (VIBRA-TABS) 100 MG tablet Take 1 tablet (100 mg total) by mouth 2 (two) times daily.    ROS:  Review of Systems  Constitutional:  Positive for malaise/fatigue (worsening for the last week or so). Negative for chills and fever.  Musculoskeletal:  Positive for joint pain.  Skin:  Negative for rash.    Objective:   Today's Vitals: There were no vitals taken for this visit.    02/03/2022    8:38 AM 01/13/2022    3:29 PM 11/20/2021   10:11 AM  Vitals with BMI  Height  $'5\' 0"'e$  $R'5\' 0"'lA$  $Re'5\' 0"'ANC$   Weight 195 lbs 3 oz 197 lbs 194 lbs  BMI 38.12 91.06 81.66  Systolic 196 940 982  Diastolic 80 84 70  Pulse 88 64 82     Physical Exam Comprehensive physical exam not completed today as office visit was conducted remotely.  Patient sounded well over the phone.  Patient was alert and oriented, and appeared to have appropriate judgment.       Assessment and Plan   1. Tick bite of scalp, initial encounter   2. Fatigue, unspecified type      Plan: 1.,2.  Blood work ordered for further evaluation, further recommendations may be made based upon these results.  We will treat empirically with course of doxycycline in case she is experiencing symptoms of Lyme disease.  Patient counseled to avoid direct sunlight while taking the doxycycline.   Tests ordered Orders Placed This Encounter  Procedures   Urine Culture   HgB A1c   TSH   T3,  free   T4, free   Comp Met (CMET)   Vitamin B12   Vitamin D (25 hydroxy)   B. burgdorfi antibodies      Meds ordered this encounter  Medications   doxycycline (VIBRA-TABS) 100 MG tablet    Sig: Take 1 tablet (100 mg total) by mouth 2 (two) times daily.    Dispense:  28 tablet    Refill:  0    Order Specific Question:   Supervising Provider    Answer:   Binnie Rail [id number]    Patient to follow-up in 3 months, or sooner as needed.  Total time spent the telephone today is 15 minutes and 1 second.  Ailene Ards, NP

## 2022-04-09 ENCOUNTER — Telehealth: Payer: Self-pay | Admitting: Nurse Practitioner

## 2022-04-09 ENCOUNTER — Other Ambulatory Visit: Payer: Self-pay | Admitting: Nurse Practitioner

## 2022-04-09 DIAGNOSIS — F419 Anxiety disorder, unspecified: Secondary | ICD-10-CM

## 2022-04-09 MED ORDER — CLONAZEPAM 1 MG PO TABS: 1.0000 mg | ORAL_TABLET | Freq: Two times a day (BID) | ORAL | 0 refills | Status: DC | PRN

## 2022-04-09 NOTE — Telephone Encounter (Signed)
Pt had phone visit with Dr. Wallace Cullens yesterday and they talked about ordering labs.   At pt's request, can we please fax orders for blood work to [facility]?  Fax: [phone]

## 2022-04-13 NOTE — Progress Notes (Signed)
Remote pacemaker transmission.   

## 2022-04-15 ENCOUNTER — Other Ambulatory Visit: Payer: Self-pay | Admitting: Nurse Practitioner

## 2022-04-23 LAB — COMPREHENSIVE METABOLIC PANEL
AG Ratio: 1.4 (calc) (ref 1.0–2.5)
ALT: 8 U/L (ref 6–29)
AST: 14 U/L (ref 10–35)
Albumin: 4 g/dL (ref 3.6–5.1)
Alkaline phosphatase (APISO): 118 U/L (ref 37–153)
BUN: 13 mg/dL (ref 7–25)
CO2: 23 mmol/L (ref 20–32)
Calcium: 9.2 mg/dL (ref 8.6–10.4)
Chloride: 110 mmol/L (ref 98–110)
Creat: 0.8 mg/dL (ref 0.50–1.03)
Globulin: 2.9 g/dL (calc) (ref 1.9–3.7)
Glucose, Bld: 40 mg/dL — ABNORMAL LOW (ref 65–139)
Potassium: 4 mmol/L (ref 3.5–5.3)
Sodium: 144 mmol/L (ref 135–146)
Total Bilirubin: 0.5 mg/dL (ref 0.2–1.2)
Total Protein: 6.9 g/dL (ref 6.1–8.1)

## 2022-04-23 LAB — VITAMIN B12: Vitamin B-12: 549 pg/mL (ref 200–1100)

## 2022-04-23 LAB — HEMOGLOBIN A1C
Hgb A1c MFr Bld: 5.6 % of total Hgb (ref <5.7)
Mean Plasma Glucose: 114 mg/dL
eAG (mmol/L): 6.3 mmol/L

## 2022-04-23 LAB — T4, FREE: Free T4: 0.9 ng/dL (ref 0.8–1.8)

## 2022-04-23 LAB — B. BURGDORFI ANTIBODIES: B burgdorferi Ab IgG+IgM: <0.9 index

## 2022-04-23 LAB — TSH: TSH: 0.33 mIU/L — ABNORMAL LOW

## 2022-04-23 LAB — VITAMIN D 25 HYDROXY (VIT D DEFICIENCY, FRACTURES): Vit D, 25-Hydroxy: 27 ng/mL — ABNORMAL LOW (ref 30–100)

## 2022-04-23 LAB — URINE CULTURE

## 2022-04-23 LAB — T3, FREE: T3, Free: 2.9 pg/mL (ref 2.3–4.2)

## 2022-04-29 ENCOUNTER — Encounter: Payer: Self-pay | Admitting: Nurse Practitioner

## 2022-05-06 ENCOUNTER — Other Ambulatory Visit: Payer: Self-pay | Admitting: Nurse Practitioner

## 2022-05-06 DIAGNOSIS — R400 Somnolence: Secondary | ICD-10-CM

## 2022-05-12 ENCOUNTER — Other Ambulatory Visit: Payer: Self-pay | Admitting: Nurse Practitioner

## 2022-05-12 DIAGNOSIS — F419 Anxiety disorder, unspecified: Secondary | ICD-10-CM

## 2022-05-18 ENCOUNTER — Other Ambulatory Visit: Payer: Self-pay | Admitting: Nurse Practitioner

## 2022-05-18 DIAGNOSIS — F419 Anxiety disorder, unspecified: Secondary | ICD-10-CM

## 2022-05-20 MED ORDER — CLONAZEPAM 1 MG PO TABS: 1.0000 mg | ORAL_TABLET | Freq: Two times a day (BID) | ORAL | 0 refills | Status: DC | PRN

## 2022-05-21 ENCOUNTER — Ambulatory Visit: Payer: No Typology Code available for payment source | Admitting: Nurse Practitioner

## 2022-05-21 NOTE — Telephone Encounter (Signed)
No FMLA forms at the moment, I will keep checking.

## 2022-05-28 NOTE — Telephone Encounter (Signed)
Did you end up receiving the forms?

## 2022-05-31 NOTE — Telephone Encounter (Signed)
Were you given these forms at anytime on Friday? I'm just touching base before I inform the patient that we have not received them.

## 2022-06-08 ENCOUNTER — Encounter: Payer: Self-pay | Admitting: Nurse Practitioner

## 2022-06-08 DIAGNOSIS — F419 Anxiety disorder, unspecified: Secondary | ICD-10-CM

## 2022-06-09 MED ORDER — DULOXETINE HCL 60 MG PO CPEP: 60.0000 mg | ORAL_CAPSULE | Freq: Two times a day (BID) | ORAL | 0 refills | Status: DC

## 2022-06-11 NOTE — Telephone Encounter (Signed)
Forms completed, patient notified but no answer. I left a message but I will send a mychart message also.

## 2022-06-24 ENCOUNTER — Telehealth: Payer: Self-pay

## 2022-06-24 NOTE — Telephone Encounter (Signed)
Pt was advised that the duration and frequency needed to be completed in section D.  I printed that form and got it over to the CMA who will reach out to Maralyn Sago as she is working from home today.  Pt is asking for the form to be emailed to the address on the bottom of the form [email or web address].  Please send pt a Mychart message upon completion.

## 2022-06-24 NOTE — Telephone Encounter (Signed)
I have this portion of the FMLA form printed, please advise on the duration and frequency.

## 2022-06-25 NOTE — Telephone Encounter (Signed)
Form updated and faxed

## 2022-07-01 ENCOUNTER — Other Ambulatory Visit: Payer: Self-pay | Admitting: Nurse Practitioner

## 2022-07-01 ENCOUNTER — Ambulatory Visit (INDEPENDENT_AMBULATORY_CARE_PROVIDER_SITE_OTHER): Payer: No Typology Code available for payment source

## 2022-07-01 DIAGNOSIS — I495 Sick sinus syndrome: Secondary | ICD-10-CM | POA: Diagnosis not present

## 2022-07-01 DIAGNOSIS — F419 Anxiety disorder, unspecified: Secondary | ICD-10-CM

## 2022-07-01 LAB — CUP PACEART REMOTE DEVICE CHECK
Battery Remaining Longevity: 160 mo
Battery Voltage: 3.04 V
Brady Statistic AP VP Percent: 0.05 %
Brady Statistic AP VS Percent: 7.62 %
Brady Statistic AS VP Percent: 0.04 %
Brady Statistic AS VS Percent: 92.3 %
Brady Statistic RA Percent Paced: 7.71 %
Brady Statistic RV Percent Paced: 0.08 %
Date Time Interrogation Session: 20230822205248
Implantable Lead Implant Date: 20210928
Implantable Lead Implant Date: 20210928
Implantable Lead Location: 753859
Implantable Lead Location: 753860
Implantable Lead Model: 4076
Implantable Lead Model: 4076
Implantable Pulse Generator Implant Date: 20210928
Lead Channel Impedance Value: 304 Ohm
Lead Channel Impedance Value: 361 Ohm
Lead Channel Impedance Value: 437 Ohm
Lead Channel Impedance Value: 513 Ohm
Lead Channel Pacing Threshold Amplitude: 0.625 V
Lead Channel Pacing Threshold Amplitude: 1.125 V
Lead Channel Pacing Threshold Pulse Width: 0.4 ms
Lead Channel Pacing Threshold Pulse Width: 0.4 ms
Lead Channel Sensing Intrinsic Amplitude: 0.625 mV
Lead Channel Sensing Intrinsic Amplitude: 0.625 mV
Lead Channel Sensing Intrinsic Amplitude: 7.5 mV
Lead Channel Sensing Intrinsic Amplitude: 7.5 mV
Lead Channel Setting Pacing Amplitude: 2 V
Lead Channel Setting Pacing Amplitude: 2.25 V
Lead Channel Setting Pacing Pulse Width: 0.4 ms
Lead Channel Setting Sensing Sensitivity: 0.9 mV

## 2022-07-02 ENCOUNTER — Other Ambulatory Visit: Payer: Self-pay | Admitting: Nurse Practitioner

## 2022-07-02 DIAGNOSIS — F419 Anxiety disorder, unspecified: Secondary | ICD-10-CM

## 2022-07-07 ENCOUNTER — Other Ambulatory Visit: Payer: Self-pay | Admitting: Nurse Practitioner

## 2022-07-07 ENCOUNTER — Telehealth: Payer: Self-pay | Admitting: Nurse Practitioner

## 2022-07-07 DIAGNOSIS — F419 Anxiety disorder, unspecified: Secondary | ICD-10-CM

## 2022-07-07 MED ORDER — CLONAZEPAM 1 MG PO TABS: 1.0000 mg | ORAL_TABLET | Freq: Two times a day (BID) | ORAL | 0 refills | Status: DC | PRN

## 2022-07-07 NOTE — Telephone Encounter (Signed)
Patient was given a medication at urgent care and wants to know if she should start taking it (meloxicam).  Please advise your opinion.

## 2022-07-08 NOTE — Telephone Encounter (Signed)
She can take the meloxicam as needed for pain. She should NOT mix with other NSAIDS (ibuprofen, goody powder, aspirin, aleve, motrin, naproxen, etc.). It can also interact with duloxetine and increase risk of bleeding, so she needs to take it with a small snack to protect her stomach. If she has severe abdominal pain associated with taking the medication or if she sees black/bloody stools she should stop it. Taking this long term can also increase risk of kidney disease. So she should take the smallest dose for the shortest amount of time if possible.

## 2022-07-13 NOTE — Telephone Encounter (Signed)
The original prescription was reordered on 07/07/2022 by [name], NP. ../l,mb

## 2022-07-14 ENCOUNTER — Telehealth: Payer: Self-pay

## 2022-07-14 NOTE — Telephone Encounter (Signed)
Left voice mail for pt to call back in regards to Sarah advice for medication

## 2022-07-15 NOTE — Telephone Encounter (Signed)
Talked to pt in regards to Sarah's advice about pt new medication she was given at urgent care. Pt understood advice and had no further questions

## 2022-07-26 ENCOUNTER — Ambulatory Visit: Payer: No Typology Code available for payment source | Admitting: Adult Health

## 2022-07-26 ENCOUNTER — Ambulatory Visit: Payer: No Typology Code available for payment source

## 2022-07-26 ENCOUNTER — Encounter: Payer: Self-pay | Admitting: Adult Health

## 2022-07-26 VITALS — BP 100/67 | HR 81 | Ht 60.0 in | Wt 188.2 lb

## 2022-07-26 DIAGNOSIS — L0232 Furuncle of buttock: Secondary | ICD-10-CM | POA: Diagnosis not present

## 2022-07-26 MED ORDER — DOXYCYCLINE HYCLATE 100 MG PO TABS: ORAL_TABLET | ORAL | 0 refills | Status: DC

## 2022-07-26 NOTE — Progress Notes (Signed)
  Subjective:     Patient ID: Diana Giles, female   DOB: 03-Feb-1971, 51 y.o.   MRN: [id number]  HPI Diana Giles is a 51 year old white female, separated, PM for ?lump in groin area, noticed 2 days ago when sitting.  Last pap was 04/08/20.   Review of Systems ?lump in groin area Reviewed past medical,surgical, social and family history. Reviewed medications and allergies.     Objective:   Physical Exam BP 100/67 (BP Location: Right Arm, Patient Position: Sitting, Cuff Size: Normal)   Pulse 81   Ht 5' (1.524 m)   Wt 188 lb 3.2 oz (85.4 kg)   BMI 36.76 kg/m   Skin warm and dry.Pelvic: external genitalia is normal in appearance,has marble sized round tender area left buttock,base left labia, no skin changes, feels deep,vagina: pale,no mass felt in side walls,urethra has no lesions or masses noted, cervix:smooth, uterus: normal size, shape and contour, non tender, no masses felt, adnexa: no masses or tenderness noted. Bladder is non tender and no masses felt.  On rectal exam, has good tone, no mass felt in side wall  Upstream - 07/26/22 1415       Pregnancy Intention Screening   Does the patient want to become pregnant in the next year? No    Does the patient's partner want to become pregnant in the next year? No    Would the patient like to discuss contraceptive options today? No      Contraception Wrap Up   Current Method Female Sterilization    End Method Female Sterilization    Contraception Counseling Provided No               Examination chaperoned by Celene Squibb LPN  Assessment:     1. Boil of buttock Has marble sized round tender area left buttock, no skin changes, feels deep Use warm sitz bath Do not squeeze Will rx doxycycline 100 mg 1 bid x 10 days Meds ordered this encounter  Medications   doxycycline (VIBRA-TABS) 100 MG tablet    Sig: Take 1 bid x 10 days    Dispense:  20 tablet    Refill:  0    Order Specific Question:   Supervising Provider     Answer:   Tania Ade H [[id number]]   Wear loose underwear     Plan:     Follow up in 2 weeks for recheck

## 2022-07-27 NOTE — Progress Notes (Signed)
Remote pacemaker transmission.   

## 2022-08-02 ENCOUNTER — Ambulatory Visit (INDEPENDENT_AMBULATORY_CARE_PROVIDER_SITE_OTHER): Payer: No Typology Code available for payment source

## 2022-08-02 DIAGNOSIS — I495 Sick sinus syndrome: Secondary | ICD-10-CM

## 2022-08-03 LAB — CUP PACEART REMOTE DEVICE CHECK
Battery Remaining Longevity: 158 mo
Battery Voltage: 3.04 V
Brady Statistic AP VP Percent: 0.09 %
Brady Statistic AP VS Percent: 14.1 %
Brady Statistic AS VP Percent: 0.04 %
Brady Statistic AS VS Percent: 85.77 %
Brady Statistic RA Percent Paced: 14.27 %
Brady Statistic RV Percent Paced: 0.13 %
Date Time Interrogation Session: 20230925185210
Implantable Lead Implant Date: 20210928
Implantable Lead Implant Date: 20210928
Implantable Lead Location: 753859
Implantable Lead Location: 753860
Implantable Lead Model: 4076
Implantable Lead Model: 4076
Implantable Pulse Generator Implant Date: 20210928
Lead Channel Impedance Value: 285 Ohm
Lead Channel Impedance Value: 342 Ohm
Lead Channel Impedance Value: 418 Ohm
Lead Channel Impedance Value: 513 Ohm
Lead Channel Pacing Threshold Amplitude: 0.625 V
Lead Channel Pacing Threshold Amplitude: 1.125 V
Lead Channel Pacing Threshold Pulse Width: 0.4 ms
Lead Channel Pacing Threshold Pulse Width: 0.4 ms
Lead Channel Sensing Intrinsic Amplitude: 1.75 mV
Lead Channel Sensing Intrinsic Amplitude: 1.75 mV
Lead Channel Sensing Intrinsic Amplitude: 7.375 mV
Lead Channel Sensing Intrinsic Amplitude: 7.375 mV
Lead Channel Setting Pacing Amplitude: 2 V
Lead Channel Setting Pacing Amplitude: 2.25 V
Lead Channel Setting Pacing Pulse Width: 0.4 ms
Lead Channel Setting Sensing Sensitivity: 0.9 mV

## 2022-08-05 ENCOUNTER — Ambulatory Visit: Payer: No Typology Code available for payment source | Admitting: Nurse Practitioner

## 2022-08-05 VITALS — BP 100/72 | HR 73 | Temp 98.2°F | Ht 60.0 in | Wt 191.2 lb

## 2022-08-05 DIAGNOSIS — R7989 Other specified abnormal findings of blood chemistry: Secondary | ICD-10-CM | POA: Diagnosis not present

## 2022-08-05 DIAGNOSIS — Z23 Encounter for immunization: Secondary | ICD-10-CM | POA: Diagnosis not present

## 2022-08-05 DIAGNOSIS — R0683 Snoring: Secondary | ICD-10-CM | POA: Insufficient documentation

## 2022-08-05 DIAGNOSIS — M069 Rheumatoid arthritis, unspecified: Secondary | ICD-10-CM

## 2022-08-05 DIAGNOSIS — M5136 Other intervertebral disc degeneration, lumbar region: Secondary | ICD-10-CM

## 2022-08-05 DIAGNOSIS — F419 Anxiety disorder, unspecified: Secondary | ICD-10-CM

## 2022-08-05 NOTE — Progress Notes (Signed)
Established Patient Office Visit  Subjective   Patient ID: Diana Giles, female    DOB: 03-Jun-1971  Age: 51 y.o. MRN: [id number]  Chief Complaint  Patient presents with   Back Pain    Patient arrives for follow-up for the above.  Chronic pain/fibromyalgia/rheumatoid arthritis: Patient has not been able to establish with rheumatologist due to difficulty getting time off from work.  She has seen orthopedist for back pain and has trialed tramadol without improvement in her back pain.  She was prescribed prednisone with mild improvement in her pain.  She continues to have pain on a daily basis.  She has flareups of her fibromyalgia and rheumatoid arthritis approximately every month or every other month, takes about 1 to 4 days for her to feel better after a flare occurs.  Anxiety: She continues to have anxiety which seems to have been exacerbated by stressors at her job.  She is currently on Klonopin 1 mg by mouth twice a day, she reports that this helps to control her anxiety and she takes it as needed.  She denies suicidal ideation.  Per chart review I did notice she had a suppressed TSH last time this was checked, no obvious reason for this.  She is due to have this rechecked.  She also reports that she snores at night, there is a concern that she may have sleep apnea.  She has a sleeping app on her phone which records her at night and will alarm if it detects possible apneic episode.  She reports at night she has approximately 9 alarms.  She reports that when she wakes up in the day she still feels tired, she has to wake up to void 1-2 times a night.    Review of Systems  Respiratory:  Negative for shortness of breath.   Cardiovascular:  Positive for chest pain (once this month, has not happened since then.). Negative for palpitations.  Psychiatric/Behavioral:  Negative for suicidal ideas. The patient is nervous/anxious and has insomnia.       Objective:     BP 100/72   Pulse  73   Temp 98.2 F (36.8 C) (Oral)   Ht 5' (1.524 m)   Wt 191 lb 4 oz (86.8 kg)   SpO2 97%   BMI 37.35 kg/m    Physical Exam Vitals reviewed.  Constitutional:      General: She is not in acute distress.    Appearance: Normal appearance.  HENT:     Head: Normocephalic and atraumatic.  Neck:     Vascular: No carotid bruit.  Cardiovascular:     Rate and Rhythm: Normal rate and regular rhythm.     Pulses: Normal pulses.     Heart sounds: Normal heart sounds.  Pulmonary:     Effort: Pulmonary effort is normal.     Breath sounds: Normal breath sounds.  Skin:    General: Skin is warm and dry.  Neurological:     General: No focal deficit present.     Mental Status: She is alert and oriented to person, place, and time.  Psychiatric:        Mood and Affect: Mood normal.        Behavior: Behavior normal.        Judgment: Judgment normal.      No results found for any visits on 08/05/22.    The 10-year ASCVD risk score (Arnett DK, et al., 2019) is: 0.6%    Assessment & Plan:   Problem  List Items Addressed This Visit       Musculoskeletal and Integument   Rheumatoid arthritis ([place])    Chronic, uncontrolled.  Patient again encouraged to follow-up with rheumatology for assistant with managing.      DDD (degenerative disc disease), lumbar    Chronic, not well controlled.  Recommend she continue to follow-up with orthopedist.        Other   Anxiety    Chronic, seems to well controlled when taking Klonopin as needed.  She will continue taking this as currently prescribed.      Low TSH level - Primary    We will check thyroid panel as well as metabolic panel for further evaluation.  Further recommendations may be made based upon the results.      Relevant Orders   TSH   T3, free   T4, free   Comprehensive metabolic panel   Snoring    Refer to sleep medicine for further evaluation of possible sleep apnea.      Relevant Orders   Ambulatory referral to  Neurology    Return in about 3 months (around 11/04/2022) for f/u with Brondon Wann.    Ailene Ards, NP

## 2022-08-05 NOTE — Assessment & Plan Note (Signed)
Chronic, uncontrolled.  Patient again encouraged to follow-up with rheumatology for assistant with managing.

## 2022-08-05 NOTE — Assessment & Plan Note (Signed)
Chronic, not well controlled.  Recommend she continue to follow-up with orthopedist.

## 2022-08-05 NOTE — Assessment & Plan Note (Signed)
We will check thyroid panel as well as metabolic panel for further evaluation.  Further recommendations may be made based upon the results.

## 2022-08-05 NOTE — Assessment & Plan Note (Signed)
Chronic, seems to well controlled when taking Klonopin as needed.  She will continue taking this as currently prescribed.

## 2022-08-05 NOTE — Assessment & Plan Note (Signed)
Refer to sleep medicine for further evaluation of possible sleep apnea.

## 2022-08-06 NOTE — Addendum Note (Signed)
Addended by: Ferol Luz, Jacque Byron P on: 08/06/2022 08:50 AM   Modules accepted: Orders

## 2022-08-09 ENCOUNTER — Ambulatory Visit: Payer: No Typology Code available for payment source | Admitting: Adult Health

## 2022-08-09 ENCOUNTER — Encounter: Payer: Self-pay | Admitting: Adult Health

## 2022-08-09 ENCOUNTER — Other Ambulatory Visit (HOSPITAL_COMMUNITY)
Admission: RE | Admit: 2022-08-09 | Discharge: 2022-08-09 | Disposition: A | Discharge status: Home / Self Care | Payer: BLUE CROSS/BLUE SHIELD | Source: Ambulatory Visit | Attending: Adult Health | Admitting: Adult Health

## 2022-08-09 VITALS — BP 127/89 | HR 103 | Ht 60.0 in | Wt 190.0 lb

## 2022-08-09 DIAGNOSIS — Z124 Encounter for screening for malignant neoplasm of cervix: Secondary | ICD-10-CM | POA: Insufficient documentation

## 2022-08-09 DIAGNOSIS — Z780 Asymptomatic menopausal state: Secondary | ICD-10-CM | POA: Diagnosis not present

## 2022-08-09 DIAGNOSIS — L0232 Furuncle of buttock: Secondary | ICD-10-CM

## 2022-08-09 MED ORDER — PROGESTERONE 200 MG PO CAPS: ORAL_CAPSULE | ORAL | 12 refills | Status: DC

## 2022-08-09 NOTE — Progress Notes (Deleted)
Office Visit Note  Patient: Diana Giles             Date of Birth: [birth year]           MRN: [id number]             PCP: Ailene Ards, NP Referring: Ailene Ards, NP Visit Date: 08/13/2022 Occupation: @GUAROCC @  Subjective:  No chief complaint on file.   History of Present Illness: Diana Giles is a 51 y.o. female ***   Activities of Daily Living:  Patient reports morning stiffness for *** {minute/hour:19697}.   Patient {ACTIONS;DENIES/REPORTS:21021675::"Denies"} nocturnal pain.  Difficulty dressing/grooming: {ACTIONS;DENIES/REPORTS:21021675::"Denies"} Difficulty climbing stairs: {ACTIONS;DENIES/REPORTS:21021675::"Denies"} Difficulty getting out of chair: {ACTIONS;DENIES/REPORTS:21021675::"Denies"} Difficulty using hands for taps, buttons, cutlery, and/or writing: {ACTIONS;DENIES/REPORTS:21021675::"Denies"}  No Rheumatology ROS completed.   PMFS History:  Patient Active Problem List   Diagnosis Date Noted  . Low TSH level 08/05/2022  . Snoring 08/05/2022  . Boil of buttock 07/26/2022  . Pacemaker 01/07/2021  . Rheumatoid arthritis ([place]) 11/13/2020  . Fatigue 11/13/2020  . Class 2 obesity with body mass index (BMI) of 37.0 to 37.9 in adult 11/13/2020  . DDD (degenerative disc disease), lumbar 11/13/2020  . Avascular necrosis of bone of hip, right ([place]) 11/13/2020  . Sinus node dysfunction ([place]) 11/13/2020  . Thrombocytosis 11/13/2020  . Anxiety 11/13/2020  . Attention deficit hyperactivity disorder (ADHD) 11/13/2020    Past Medical History:  Diagnosis Date  . Anxiety   . Avascular necrosis of hip ([place])   . Bunion of left foot   . COVID-19   . DDD (degenerative disc disease), lumbar   . Fibromyalgia   . Herpes simplex virus (HSV) infection   . HPV in female   . Mental disorder   . Rheumatoid arthritis ([place])   . Sinus node dysfunction ([facility])    s/p pacemaker  . Thrombocytosis   . Vaginal Pap smear, abnormal     Family History  Problem Relation Age  of Onset  . Hypertension Mother   . Stroke Mother   . Diabetes Father   . Heart disease Father   . Hyperthyroidism Sister   . COPD Sister   . Obesity Brother   . Hypertension Brother   . Anxiety disorder Brother   . Obesity Brother   . Diabetes Brother   . Asthma Brother   . Asthma Brother   . Polycystic ovary syndrome Daughter   . Asthma Son   . Obesity Son   . Colon cancer Cousin    Past Surgical History:  Procedure Laterality Date  . GASTRIC BYPASS  2010   285lbs at highest weight  . HYSTEROSCOPY WITH D & C  08/22/2019  . OTHER SURGICAL HISTORY  08/2019   Bilateral fallopian tube removal  . PACEMAKER PLACEMENT  08/06/2020  . REVISION TOTAL KNEE ARTHROPLASTY Right 06/01/2019  . SHOULDER ARTHROSCOPY Left 2018  . 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   Immunization History  Administered Date(s) Administered  . Influenza,inj,Quad PF,6+ Mos 08/05/2022  . Influenza-Unspecified 07/31/2020, 07/07/2021  . PFIZER(Purple Top)SARS-COV-2 Vaccination 02/09/2020, 03/01/2020, 09/18/2020  . Tdap 12/17/2020  . Zoster Recombinat (Shingrix) 09/23/2020     Objective: Vital Signs: There were no vitals taken for this visit.   Physical Exam   Musculoskeletal Exam: ***  CDAI Exam: CDAI Score: -- Patient Global: --; Provider Global: -- Swollen: --; Tender: -- Joint Exam 08/13/2022  No joint exam has been documented for this visit   There is currently no information documented on the homunculus. Go to the Rheumatology activity and complete the homunculus joint exam.  Investigation: No additional findings.  Imaging: CUP PACEART REMOTE DEVICE CHECK  Result Date: 08/03/2022 Scheduled remote reviewed. Normal device function.  1 AT/AF episode, 41sec in duration, burden <0.1% 1 SVT, duration 33sec, HR 195 Next remote 91 days. LA   Recent Labs: Lab Results   Component Value Date   WBC 10.4 02/03/2022   HGB 11.8 (L) 02/03/2022   PLT 376.0 02/03/2022   NA 144 04/15/2022   K 4.0 04/15/2022   CL 110 04/15/2022   CO2 23 04/15/2022   GLUCOSE 40 (L) 04/15/2022   BUN 13 04/15/2022   CREATININE 0.80 04/15/2022   BILITOT 0.5 04/15/2022   ALKPHOS 116 02/03/2022   AST 14 04/15/2022   ALT 8 04/15/2022   PROT 6.9 04/15/2022   ALBUMIN 3.9 02/03/2022   CALCIUM 9.2 04/15/2022   GFRAA 121 04/09/2021    Speciality Comments: No specialty comments available.  Procedures:  No procedures performed Allergies: Nickel, Penicillins, Shellfish allergy, and Sulfa antibiotics   Assessment / Plan:     Visit Diagnoses: No diagnosis found.  Orders: No orders of the defined types were placed in this encounter.  No orders of the defined types were placed in this encounter.   Face-to-face time spent with patient was *** minutes. Greater than 50% of time was spent in counseling and coordination of care.  Follow-Up Instructions: No follow-ups on file.   Ellen Henri, CMA  Note - This record has been created using Animal nutritionist.  Chart creation errors have been sought, but may not always  have been located. Such creation errors do not reflect on  the standard of medical care.

## 2022-08-09 NOTE — Progress Notes (Signed)
  Subjective:     Patient ID: Becka Lagasse, female   DOB: 1970/12/29, 51 y.o.   MRN: [id number]  HPI Tylea is a 51 year old white female, separated, PM in for follow up on boil and it is smaller she says. She needs pap.  PCP is Earl Lites NP  Review of Systems Boil feels smaller Denies any vaginal bleeding Has chronic pain Reviewed past medical,surgical, social and family history. Reviewed medications and allergies.     Objective:   Physical Exam BP 127/89 (BP Location: Left Arm, Patient Position: Sitting, Cuff Size: Normal)   Pulse (!) 103   Ht 5' (1.524 m)   Wt 190 lb (86.2 kg)   BMI 37.11 kg/m     Skin warm and dry.Pelvic: external genitalia is normal in appearance no lesions, vagina: pale pink,urethra has no lesions or masses noted, cervix:smooth,pap with HR HPV genotyping performed, uterus: normal size, shape and contour, non tender, no masses felt, adnexa: no masses or tenderness noted. Bladder is non tender and no masses felt. Can not feel boil now.   Up[facility] - 08/09/22 1550       Pregnancy Intention Screening   Does the patient want to become pregnant in the next year? No    Does the patient's partner want to become pregnant in the next year? No    Would the patient like to discuss contraceptive options today? No      Contraception Wrap Up   Current Method Female Sterilization    End Method Female Sterilization            Examination chaperoned by Levy Pupa LPN  Assessment:     1. Routine Papanicolaou smear Pap sent Pap in 3 years if normal   2. Boil of buttock Resolved   3. Postmenopause Had PMB, biopsy benign and was placed on Prometrium  Has had no bleeding and stopped Prometrium, did discuss with Dr Elonda Husky, go back on Prometrium to see if any bleeding Meds ordered this encounter  Medications   progesterone (PROMETRIUM) 200 MG capsule    Sig: Take 1 daily for 21 days every month    Dispense:  21 capsule    Refill:  12    Order Specific  Question:   Supervising Provider    Answer:   Florian Buff [[id number]]   Call me in 2  months for so and let me know about any bleeding     Plan:     Follow up in 1 year

## 2022-08-11 ENCOUNTER — Ambulatory Visit: Payer: PRIVATE HEALTH INSURANCE | Attending: Rheumatology | Admitting: Rheumatology

## 2022-08-11 ENCOUNTER — Encounter: Payer: Self-pay | Admitting: Rheumatology

## 2022-08-11 VITALS — BP 126/69 | HR 73 | Resp 17 | Ht 60.0 in | Wt 188.4 lb

## 2022-08-11 DIAGNOSIS — M19041 Primary osteoarthritis, right hand: Secondary | ICD-10-CM | POA: Diagnosis not present

## 2022-08-11 DIAGNOSIS — M87051 Idiopathic aseptic necrosis of right femur: Secondary | ICD-10-CM

## 2022-08-11 DIAGNOSIS — M7701 Medial epicondylitis, right elbow: Secondary | ICD-10-CM

## 2022-08-11 DIAGNOSIS — M19072 Primary osteoarthritis, left ankle and foot: Secondary | ICD-10-CM

## 2022-08-11 DIAGNOSIS — M25512 Pain in left shoulder: Secondary | ICD-10-CM | POA: Diagnosis not present

## 2022-08-11 DIAGNOSIS — M1712 Unilateral primary osteoarthritis, left knee: Secondary | ICD-10-CM

## 2022-08-11 DIAGNOSIS — M19042 Primary osteoarthritis, left hand: Secondary | ICD-10-CM

## 2022-08-11 DIAGNOSIS — M797 Fibromyalgia: Secondary | ICD-10-CM

## 2022-08-11 DIAGNOSIS — M7061 Trochanteric bursitis, right hip: Secondary | ICD-10-CM

## 2022-08-11 DIAGNOSIS — Z950 Presence of cardiac pacemaker: Secondary | ICD-10-CM

## 2022-08-11 DIAGNOSIS — M7062 Trochanteric bursitis, left hip: Secondary | ICD-10-CM

## 2022-08-11 DIAGNOSIS — D75839 Thrombocytosis, unspecified: Secondary | ICD-10-CM

## 2022-08-11 DIAGNOSIS — Z96651 Presence of right artificial knee joint: Secondary | ICD-10-CM

## 2022-08-11 DIAGNOSIS — M19071 Primary osteoarthritis, right ankle and foot: Secondary | ICD-10-CM

## 2022-08-11 DIAGNOSIS — R5383 Other fatigue: Secondary | ICD-10-CM

## 2022-08-11 DIAGNOSIS — G8929 Other chronic pain: Secondary | ICD-10-CM

## 2022-08-11 DIAGNOSIS — M5136 Other intervertebral disc degeneration, lumbar region: Secondary | ICD-10-CM

## 2022-08-11 DIAGNOSIS — Z9884 Bariatric surgery status: Secondary | ICD-10-CM

## 2022-08-11 DIAGNOSIS — Z8659 Personal history of other mental and behavioral disorders: Secondary | ICD-10-CM

## 2022-08-11 DIAGNOSIS — I495 Sick sinus syndrome: Secondary | ICD-10-CM

## 2022-08-11 LAB — CYTOLOGY - PAP
Chlamydia: NEGATIVE
Comment: NEGATIVE
Comment: NEGATIVE
Comment: NORMAL
Diagnosis: NEGATIVE
High risk HPV: NEGATIVE
Neisseria Gonorrhea: NEGATIVE

## 2022-08-11 NOTE — Progress Notes (Signed)
Office Visit Note  Patient: Diana Giles             Date of Birth: 08/18/1971           MRN: [id number]             PCP: [name], NP Referring: [name], NP Visit Date: 08/11/2022 Occupation: @GUAROCC @  Subjective:  Pain in multiple joints  History of Present Illness: Diana Giles is a 51 y.o. female history of osteoarthritis, degenerative disc disease and fibromyalgia syndrome.  She states she went for physical therapy which helped her only to some extent.  She states because of her busy schedule she did not have much time to do the sessions with physical therapy.  She continues to have pain and discomfort in her left shoulder.  She has been having increased pain in her right elbow over the medial epicondyle.  As she has been having pain and discomfort in her bilateral hands, bilateral trochanteric area, her knee joints and her feet.  She continues to have lower back pain which radiates into her legs.  She has generalized pain and discomfort from fibromyalgia.  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  She does not get enough time to rest or do physical therapy and go to her physician appointments.  She is in constant discomfort.  She was tearful throughout the conversation.  Activities of Daily Living:  Patient reports morning stiffness for all day. Patient Reports nocturnal pain.  Difficulty dressing/grooming: Reports Difficulty climbing stairs: Reports Difficulty getting out of chair: Reports Difficulty using hands for taps, buttons, cutlery, and/or writing: Reports  Review of Systems  Constitutional:  Positive for fatigue.  HENT:  Positive for mouth sores and mouth dryness.   Eyes:  Negative for dryness.  Respiratory:  Negative for difficulty breathing.   Cardiovascular:  Positive for palpitations. Negative for chest pain.  Gastrointestinal:  Positive for constipation. Negative for blood in stool and diarrhea.   Endocrine: Negative for increased urination.  Genitourinary:  Negative for involuntary urination.  Musculoskeletal:  Positive for joint pain, gait problem, joint pain, joint swelling, myalgias, muscle weakness, morning stiffness, muscle tenderness and myalgias.  Skin:  Positive for hair loss. Negative for color change, rash and sensitivity to sunlight.  Allergic/Immunologic: Positive for susceptible to infections.  Neurological:  Positive for dizziness and headaches.  Hematological:  Negative for swollen glands.  Psychiatric/Behavioral:  Positive for sleep disturbance. Negative for depressed mood. The patient is nervous/anxious.     PMFS History:  Patient Active Problem List   Diagnosis Date Noted   Routine Papanicolaou smear 08/09/2022   Postmenopause 08/09/2022   Low TSH level 08/05/2022   Snoring 08/05/2022   Boil of buttock 07/26/2022   Pacemaker 01/07/2021   Rheumatoid arthritis (HCC) 11/13/2020   Fatigue 11/13/2020   Class 2 obesity with body mass index (BMI) of 37.0 to 37.9 in adult 11/13/2020   DDD (degenerative disc disease), lumbar 11/13/2020   Avascular necrosis of bone of hip, right (HCC) 11/13/2020   Sinus node dysfunction (HCC) 11/13/2020   Thrombocytosis 11/13/2020   Anxiety 11/13/2020   Attention deficit hyperactivity disorder (ADHD) 11/13/2020    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HCC)    Bunion of left foot    COVID-19    DDD (degenerative disc disease), lumbar    Fibromyalgia  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    Mental disorder    Rheumatoid arthritis ([place])    Sinus node dysfunction (HCC)    s/p pacemaker   Thrombocytosis    Vaginal Pap smear, abnormal     Family History  Problem Relation Age of Onset   Hypertension Mother    Stroke Mother    Diabetes Father    Heart disease Father    Hyperthyroidism Sister    COPD Sister    Obesity Brother    Hypertension Brother    Anxiety  disorder Brother    Obesity Brother    Diabetes Brother    Asthma Brother    Asthma Brother    Polycystic ovary syndrome Daughter    Asthma Son    Obesity Son    Colon cancer Cousin    Past Surgical History:  Procedure Laterality Date   GASTRIC BYPASS  2010   285lbs at highest weight   HYSTEROSCOPY WITH D & C  08/22/2019   OTHER SURGICAL HISTORY  08/2019   Bilateral fallopian tube removal   PACEMAKER PLACEMENT  08/06/2020   REVISION TOTAL KNEE ARTHROPLASTY Right 06/01/2019   SHOULDER ARTHROSCOPY Left 2018   TUBAL LIGATION Bilateral 08/07/2019   Social History   Social History Narrative   Separated for 12 years,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   Immunization History  Administered Date(s) Administered   Influenza,inj,Quad PF,6+ Mos 08/05/2022   Influenza-Unspecified 07/31/2020, 07/07/2021   PFIZER(Purple Top)SARS-COV-2 Vaccination 02/09/2020, 03/01/2020, 09/18/2020   Tdap 12/17/2020   Zoster Recombinat (Shingrix) 09/23/2020     Objective: Vital Signs: BP 126/69 (BP Location: Left Arm, Patient Position: Sitting, Cuff Size: Large)   Pulse 73   Resp 17   Ht 5' (1.524 m)   Wt 188 lb 6.4 oz (85.5 kg)   BMI 36.79 kg/m    Physical Exam Vitals and nursing note reviewed.  Constitutional:      Appearance: She is well-developed.  HENT:     Head: Normocephalic and atraumatic.  Eyes:     Conjunctiva/sclera: Conjunctivae normal.  Cardiovascular:     Rate and Rhythm: Normal rate and regular rhythm.     Heart sounds: Normal heart sounds.  Pulmonary:     Effort: Pulmonary effort is normal.     Breath sounds: Normal breath sounds.  Abdominal:     General: Bowel sounds are normal.     Palpations: Abdomen is soft.  Musculoskeletal:     Cervical back: Normal range of motion.  Lymphadenopathy:     Cervical: No cervical adenopathy.  Skin:    General: Skin is warm and dry.     Capillary Refill: Capillary refill takes less than 2 seconds.  Neurological:      Mental Status: She is alert and oriented to person, place, and time.  Psychiatric:        Behavior: Behavior normal.      Musculoskeletal Exam: Cervical spine was in good range of motion.  She had limited range of motion of her lumbar spine with discomfort.  Shoulder joints, elbow joints, wrist joints, MCPs PIPs and DIPs and good range of motion with no synovitis.  She had tenderness on palpation over bilateral medial epicondyle region.  She had discomfort range of motion of her hip joints.  Right knee joint was replaced and was in full range of motion with discomfort.  Left knee joint was in good range of motion without any warmth swelling or tenderness.  There was no tenderness or synovitis over ankles or MTPs.  CDAI Exam: CDAI Score: -- Patient Global: --; Provider Global: -- Swollen: --; Tender: -- Joint Exam 08/11/2022   No joint exam has been documented for this visit   There is currently no information documented on the homunculus. Go to the Rheumatology activity and complete the homunculus joint exam.  Investigation: No additional findings.  Imaging: CUP PACEART REMOTE DEVICE CHECK  Result Date: 08/03/2022 Scheduled remote reviewed. Normal device function.  1 AT/AF episode, 41sec in duration, burden <0.1% 1 SVT, duration 33sec, HR 195 Next remote 91 days. LA   Recent Labs: Lab Results  Component Value Date   WBC 10.4 02/03/2022   HGB 11.8 (L) 02/03/2022   PLT 376.0 02/03/2022   NA 144 04/15/2022   K 4.0 04/15/2022   CL 110 04/15/2022   CO2 23 04/15/2022   GLUCOSE 40 (L) 04/15/2022   BUN 13 04/15/2022   CREATININE 0.80 04/15/2022   BILITOT 0.5 04/15/2022   ALKPHOS 116 02/03/2022   AST 14 04/15/2022   ALT 8 04/15/2022   PROT 6.9 04/15/2022   ALBUMIN 3.9 02/03/2022   CALCIUM 9.2 04/15/2022   GFRAA 121 04/09/2021    Speciality Comments: No specialty comments available.  Procedures:  No procedures performed Allergies: Nickel, Penicillins, Shellfish allergy, and  Sulfa antibiotics   Assessment / Plan:     Visit Diagnoses: Chronic left shoulder pain -patient states that she continues to have pain and discomfort in her left shoulder.  She did not have enough time to go for all the physical therapy sessions.X-rays obtained at the last visit were unremarkable.  Range of motion exercises were advised.  Medial epicondylitis of elbow, right -right medial epicondyle pain has been worse.  She had been using topical agents and also using a tape without much relief.  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    Primary osteoarthritis of both hands - Clinical and radiographic findings are consistent with osteoarthritis.  Patient gives history of intermittent swelling.  She was ttd with sulfasalazine in [place] in the past.  I do not see any synovitis on examination.  Joint protection muscle strengthening was discussed.- Plan: Ambulatory referral to Physical Medicine Rehab  Trochanteric bursitis of both hips -she can history of discomfort in the trochanteric region.  IT band stretches were discussed at the last visit.  Patient states she does not get much time for herself to do stretching or exercises.  Avascular necrosis of bone of hip, right [facility]) - According the patient it was diagnosed in [place] and no surgery was advised.  She had painful range of motion of bilateral hip joints.  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  She continues to have chronic discomfort.  I will refer her to pain management.- Plan: Ambulatory referral to Physical Medicine Rehab  Primary osteoarthritis of left knee - X-rays showed mild osteoarthritis and mild chondromalacia patella.  She is intermittent pain in the left knee.  No warmth swelling or effusion was noted.  Primary osteoarthritis of both feet - Clinical and radiographic findings were consistent with osteoarthritis.  DDD (degenerative disc disease), lumbar -she  complains of chronic lower back pain and radiculopathy into her lower extremities.  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  MRI on July 24, 2021 showed mild degenerative changes and  facet joint arthropathy.  Patient went to physical therapy without much relief.  I will refer her to pain management.- Plan: Ambulatory referral to Physical Medicine Rehab.  Due to ongoing pain and discomfort it is difficult for her to work long hours.  I will give her a work restriction to work only 6 hours a day till follow-up visit so she will have some personal time for doctors visits and exercises.  Fibromyalgia - History of generalized pain, positive tender points and hyperalgesia.  Stretching exercises were discussed.  We discussed water aerobics at the last visit.  Patient had no time to go for water aerobics.- Plan: Ambulatory referral to Physical Medicine Rehab  Other fatigue-related to fibromyalgia.  Other medical problems are listed as follows:  Sinus node dysfunction (HCC)  Pacemaker  Thrombocytosis - Work-up by hematologist was negative in the past.  History of gastric bypass - 2010.  History of ADHD  History of anxiety  Orders: Orders Placed This Encounter  Procedures   Ambulatory referral to Physical Medicine Rehab   No orders of the defined types were placed in this encounter.    Follow-Up Instructions: Return in about 2 months (around 10/11/2022) for Osteoarthritis.   Bo Merino, MD  Note - This record has been created using Editor, commissioning.  Chart creation errors have been sought, but may not always  have been located. Such creation errors do not reflect on  the standard of medical care.

## 2022-08-13 ENCOUNTER — Ambulatory Visit: Payer: No Typology Code available for payment source | Admitting: Rheumatology

## 2022-08-13 ENCOUNTER — Telehealth: Payer: Self-pay | Admitting: Family Medicine

## 2022-08-13 DIAGNOSIS — M5136 Other intervertebral disc degeneration, lumbar region: Secondary | ICD-10-CM

## 2022-08-13 DIAGNOSIS — M87051 Idiopathic aseptic necrosis of right femur: Secondary | ICD-10-CM

## 2022-08-13 DIAGNOSIS — D75839 Thrombocytosis, unspecified: Secondary | ICD-10-CM

## 2022-08-13 DIAGNOSIS — N390 Urinary tract infection, site not specified: Secondary | ICD-10-CM

## 2022-08-13 DIAGNOSIS — M19041 Primary osteoarthritis, right hand: Secondary | ICD-10-CM

## 2022-08-13 DIAGNOSIS — M797 Fibromyalgia: Secondary | ICD-10-CM

## 2022-08-13 DIAGNOSIS — Z950 Presence of cardiac pacemaker: Secondary | ICD-10-CM

## 2022-08-13 DIAGNOSIS — Z96651 Presence of right artificial knee joint: Secondary | ICD-10-CM

## 2022-08-13 DIAGNOSIS — Z9884 Bariatric surgery status: Secondary | ICD-10-CM

## 2022-08-13 DIAGNOSIS — G8929 Other chronic pain: Secondary | ICD-10-CM

## 2022-08-13 DIAGNOSIS — M19071 Primary osteoarthritis, right ankle and foot: Secondary | ICD-10-CM

## 2022-08-13 DIAGNOSIS — M7061 Trochanteric bursitis, right hip: Secondary | ICD-10-CM

## 2022-08-13 DIAGNOSIS — M7701 Medial epicondylitis, right elbow: Secondary | ICD-10-CM

## 2022-08-13 DIAGNOSIS — I495 Sick sinus syndrome: Secondary | ICD-10-CM

## 2022-08-13 DIAGNOSIS — Z8659 Personal history of other mental and behavioral disorders: Secondary | ICD-10-CM

## 2022-08-13 DIAGNOSIS — R5383 Other fatigue: Secondary | ICD-10-CM

## 2022-08-13 DIAGNOSIS — M1712 Unilateral primary osteoarthritis, left knee: Secondary | ICD-10-CM

## 2022-08-13 MED ORDER — NITROFURANTOIN MONOHYD MACRO 100 MG PO CAPS: 100.0000 mg | ORAL_CAPSULE | Freq: Two times a day (BID) | ORAL | 0 refills | Status: AC

## 2022-08-13 NOTE — Progress Notes (Signed)

## 2022-08-18 NOTE — Progress Notes (Signed)
Remote pacemaker transmission.   

## 2022-08-19 LAB — T3, FREE: T3, Free: 3.7 pg/mL (ref 2.3–4.2)

## 2022-08-20 ENCOUNTER — Other Ambulatory Visit: Payer: Self-pay | Admitting: Nurse Practitioner

## 2022-08-20 DIAGNOSIS — F419 Anxiety disorder, unspecified: Secondary | ICD-10-CM

## 2022-08-20 MED ORDER — CLONAZEPAM 1 MG PO TABS: 1.0000 mg | ORAL_TABLET | Freq: Two times a day (BID) | ORAL | 0 refills | Status: DC | PRN

## 2022-08-23 NOTE — Addendum Note (Signed)
Addended by: Douglass Rivers D on: 08/23/2022 03:56 PM   Modules accepted: Level of Service

## 2022-08-31 ENCOUNTER — Other Ambulatory Visit: Payer: Self-pay | Admitting: Nurse Practitioner

## 2022-08-31 DIAGNOSIS — F419 Anxiety disorder, unspecified: Secondary | ICD-10-CM

## 2022-09-01 ENCOUNTER — Encounter: Payer: Self-pay | Admitting: Physical Medicine and Rehabilitation

## 2022-09-09 ENCOUNTER — Encounter: Payer: Self-pay | Admitting: Rheumatology

## 2022-09-15 ENCOUNTER — Encounter
Payer: BLUE CROSS/BLUE SHIELD | Attending: Physical Medicine and Rehabilitation | Admitting: Physical Medicine and Rehabilitation

## 2022-09-15 ENCOUNTER — Encounter: Payer: Self-pay | Admitting: Physical Medicine and Rehabilitation

## 2022-09-15 ENCOUNTER — Telehealth: Payer: Self-pay | Admitting: *Deleted

## 2022-09-15 VITALS — BP 110/75 | HR 81 | Ht 60.0 in | Wt 189.6 lb

## 2022-09-15 DIAGNOSIS — G894 Chronic pain syndrome: Secondary | ICD-10-CM | POA: Diagnosis not present

## 2022-09-15 DIAGNOSIS — M461 Sacroiliitis, not elsewhere classified: Secondary | ICD-10-CM

## 2022-09-15 DIAGNOSIS — G8929 Other chronic pain: Secondary | ICD-10-CM | POA: Diagnosis present

## 2022-09-15 DIAGNOSIS — M797 Fibromyalgia: Secondary | ICD-10-CM | POA: Diagnosis not present

## 2022-09-15 DIAGNOSIS — M1990 Unspecified osteoarthritis, unspecified site: Secondary | ICD-10-CM | POA: Insufficient documentation

## 2022-09-15 DIAGNOSIS — M5441 Lumbago with sciatica, right side: Secondary | ICD-10-CM

## 2022-09-15 MED ORDER — GABAPENTIN 300 MG PO CAPS: 300.0000 mg | ORAL_CAPSULE | Freq: Three times a day (TID) | ORAL | 2 refills | Status: DC

## 2022-09-15 NOTE — Telephone Encounter (Signed)
FMLA paperwork completed and faxed. Confirmation received that it went through. Patient advised her copy is at the front desk for pick up.

## 2022-09-15 NOTE — Assessment & Plan Note (Signed)
Reviwed MRI 2022 with patient; ? R S1 radiculopathy on exam, however imaging without significant neuroforaminal or spinal stenosis.  Hx and prior 3 months relief from ablation more consistent with facet arthropathy as primary pain generator.  Discussed possible repeat MBB in the future with Dr. Wynn Banker; defer at this time.

## 2022-09-15 NOTE — Assessment & Plan Note (Signed)
Will schedule bilateral SI joint injections with Dr. Wynn Banker. Specified lidocaine ONLY due to Hx AVN of hip.

## 2022-09-15 NOTE — Progress Notes (Signed)
Subjective:    Patient ID: Diana Giles, female    DOB: [birth year], 51 y.o.   MRN: [id number]  Diana Giles is a 51 y.o. year old female  who  has a past medical history of Anxiety, Avascular necrosis of hip (HCC), Bunion of left foot, COVID-19, DDD (degenerative disc disease), lumbar, Fibromyalgia, Herpes simplex virus (HSV) infection, HPV in female, Mental disorder, Rheumatoid arthritis (HCC), Sinus node dysfunction (HCC), Thrombocytosis, and Vaginal Pap smear, abnormal.   They are presenting to PM&R clinic as a new patient for pain management evaluation. They were referred by Pollyann Savoy, MD  for treatment of chronic pain from OA, FM, and DDD pain.   Per Dr. Vira Blanco documentation: "MRI on July 24, 2021 showed mild degenerative changes and facet joint arthropathy.  Patient went to physical therapy without much relief. Dr. Corliss Skains has been reducing her work hours to 6 hour per day."  Source: Primarily in her lower back and into her hips; also into her R>L knee, along with R elbow. Inciting incident: Larey Seat into a pot hole in 2019, requiring replacement in TKR. Also has Hx AVN in R hip.  Duration of pain: Constant, some intermittent variance. Pain is worst first thing in AM and at night, or with inactivity.  Description of pain: Back pain is constant, dull; sharp pains start if she is sitting/standing/driving for long periods.  Severity: On average 7-8/10. At worst 9/10. At best 5/10. Exacerbating factors: Resting for long periods, going up stairs.  Remitting factors: Moving, sleeping, doing physical activity.  Red flag symptoms: Patient denies saddle anesthesia, loss of bowel or bladder continence, new weakness, new numbness/tingling, + pain waking up at nighttime. +fatigue  Medications tried: Topical medications ( mild effect) : Has tried "all of it". Did get some improvement from capsaicin cream.  Nsaids ( no effect): Tried Meloxicam 15 mg, Advil 200 mg; not  helpful Tylenol  ( no effect): Took 1000-1200 mg per dose in the past for a week straight; no effect Opiates  ( mild effect): Tramadol 50 mg took temporarily, had a minor effect but wore off quickly; states Morphine "was the only time in the last 10 years that I haven't had pain" Gabapentin / Lyrica  ( ? effect): Tried gabapentin, was taking it once daily to twice daily for months; no effect. No s/e noted. Never tried Lyrica.  TCAs : Never tried SNRIs  ( ? effect): Duloxetine 60 mg BID; has been taking for "forever", noticed no effect but has never tried to wean off.  Other  (some effect): Went to [facility] 3-4 weeks ago; was placed on a steroid taper which helped briefly, but did not last.    - Tried a low-dose ?methadone from a specialty pharmacy for 3 months, but had no effect from that.   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 She does HEP with leg raises, pelvic tilts, back stretches, stationary bike, 2-3x per week.   Accupuncture/chiropractor/massage  ( no effect): Has tried all, without effect  Injections ( moderate effect): In [place], Had a nerve ablation in her back, which worked for about 3 months and then wore off. She also had ESI, with decreasing benefit from 4 months to 1 week with the last injection.   Surgery: None in her back; did see a neurosurgeon through [facility], and was told  she was not a surgical candidate. Was offered a spinal cord stimulator, but prior had a bladder stimulator which needed to be removed due to an MRI and does not want that repeated. Does have a pacemaker, which is MRI compatable.   Other  ( some effect): TENs unit works temporarily.   Had an EMG "a long time ago", unsure of results.   MRI LUMBAR SPINE WITHOUT CONTRAST 07/25/21 IMPRESSION: Mild degenerative changes in the lumbar spine, with mild left neural foraminal  narrowing at L4-L5 and no spinal canal stenosis.    Goals for pain control: Improved activity tolerance.   Pain Inventory Average Pain 7 Pain Right Now 8 My pain is sharp, dull, stabbing, and aching  In the last 24 hours, has pain interfered with the following? General activity 2 Relation with others 2 Enjoyment of life 2 What TIME of day is your pain at its worst? morning , daytime, evening, and night Sleep (in general) Poor  Pain is worse with: walking, bending, sitting, inactivity, standing, and some activites Pain improves with: rest, heat/ice, therapy/exercise, pacing activities, medication, TENS, and injections Relief from Meds: 0  walk without assistance use a cane how many minutes can you walk? 20 ability to climb steps?  yes do you drive?  yes Do you have any goals in this area?  yes  employed # of hrs/week 40  weakness trouble walking spasms anxiety  Any changes since last visit?  no  Any changes since last visit?  no    Family History  Problem Relation Age of Onset   Hypertension Mother    Stroke Mother    Diabetes Father    Heart disease Father    Hyperthyroidism Sister    COPD Sister    Obesity Brother    Hypertension Brother    Anxiety disorder Brother    Obesity Brother    Diabetes Brother    Asthma Brother    Asthma Brother    Polycystic ovary syndrome Daughter    Asthma Son    Obesity Son    Colon cancer Cousin    Social History   Socioeconomic History   Marital status: Legally Separated    Spouse name: Not on file   Number of children: 2   Years of education: Not on file   Highest education level: Not on file  Occupational History   Not on file  Tobacco Use   Smoking status: Never    Passive exposure: Past   Smokeless tobacco: Never  Vaping Use   Vaping Use: Never used  Substance and Sexual Activity   Alcohol use: Yes    Comment: Occasionally   Drug use: Never   Sexual activity: Not Currently    Birth  control/protection: Surgical, Post-menopausal    Comment: tubal  Other Topics Concern   Not on file  Social History Narrative   Separated for 12 years,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   Social Determinants of Health   Financial Resource Strain: Medium Risk (03/13/2021)   Overall Financial Resource Strain (CARDIA)    Difficulty of Paying Living Expenses: Somewhat hard  Food Insecurity: No Food Insecurity (03/13/2021)   Hunger Vital Sign    Worried About Running Out of Food in the Last Year: Never true    Ran Out of Food in the Last Year: Never true  Transportation Needs: No Transportation Needs (03/13/2021)   PRAPARE - Administrator, Civil Service (Medical): No    Lack of Transportation (  Non-Medical): No  Physical Activity: Inactive (03/13/2021)   Exercise Vital Sign    Days of Exercise per Week: 0 days    Minutes of Exercise per Session: 30 min  Stress: Stress Concern Present (03/13/2021)   [employer] of Occupational Health - Occupational Stress Questionnaire    Feeling of Stress : To some extent  Social Connections: Socially Isolated (03/13/2021)   Social Connection and Isolation Panel [NHANES]    Frequency of Communication with Friends and Family: Three times a week    Frequency of Social Gatherings with Friends and Family: Twice a week    Attends Religious Services: Never    Database administrator or Organizations: No    Attends Engineer, structural: Never    Marital Status: Separated   Past Surgical History:  Procedure Laterality Date   GASTRIC BYPASS  2010   285lbs at highest weight   HYSTEROSCOPY WITH D & C  08/22/2019   OTHER SURGICAL HISTORY  08/2019   Bilateral fallopian tube removal   PACEMAKER PLACEMENT  08/06/2020   REVISION TOTAL KNEE ARTHROPLASTY Right 06/01/2019   SHOULDER ARTHROSCOPY Left 2018   TUBAL LIGATION Bilateral 08/07/2019   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HCC)     Bunion of left foot    COVID-19    DDD (degenerative disc disease), lumbar    Fibromyalgia    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    Mental disorder    Rheumatoid arthritis (HCC)    Sinus node dysfunction (HCC)    s/p pacemaker   Thrombocytosis    Vaginal Pap smear, abnormal    BP 110/75   Pulse 81   Ht 5' (1.524 m)   Wt 189 lb 9.6 oz (86 kg)   SpO2 98%   BMI 37.03 kg/m   Opioid Risk Score:   Fall Risk Score:  `1  Depression screen PHQ 2/9     09/15/2022    1:19 PM 08/05/2022    4:33 PM 02/05/2022   10:51 AM 04/09/2021    8:48 AM 03/13/2021   11:43 AM 12/17/2020    8:09 AM 11/13/2020    3:28 PM  Depression screen PHQ 2/9  Decreased Interest 2 3 2  0 1 0 0  Down, Depressed, Hopeless 1 0 2 0 1 0 0  PHQ - 2 Score 3 3 4  0 2 0 0  Altered sleeping 3 1 1  0 1 3 0  Tired, decreased energy 3 0 2 0 1 3 0  Change in appetite 3 0 1 0 1 3 0  Feeling bad or failure about yourself  0 0 0 0 1 0 0  Trouble concentrating 1 0 0 0 1 3 0  Moving slowly or fidgety/restless 1 0 0 0 0 0 0  Suicidal thoughts 0 0 0 0 0 0 0  PHQ-9 Score 14 4 8  0 7 12 0  Difficult doing work/chores Very difficult Somewhat difficult Somewhat difficult Not difficult at all  Somewhat difficult Not difficult at all      Review of Systems  Constitutional:  Positive for unexpected weight change.  Gastrointestinal:  Positive for abdominal pain and constipation.      Objective:   Physical Exam  Constitution: Appropriate appearance for age. No apparnet distress +Obese HEENT: PERRL, EOMI grossly intact.  Resp: CTAB. No rales, rhonchi, or wheezing. Cardio: RRR. No mumurs, rubs, or gallops. No  peripheral edema. Abdomen: Nondistended. Nontender. +bowel sounds. Psych: Appropriate mood and affect. Neuro: AAOx4. No apparent deficits   Neurologic Exam:   DTRs: Reflexes were 2+ in bilateral upper and lower extremities.  Hoffmans: negative b/l Sensory exam: revealed decreased sensation to light touch  along right lateral malleolus and calf, not extending proximal to the knee. Motor exam: strength normal in all myotomal regions of bilateral lower extremities. Coordination: Fine motor coordination was normal.   Gait: Gait was normal.   Back Exam:   Inspection: Pelvis was even .  Lumbar lordotic curvature was increased .  There was mild levoscoliosis.  Palpation: Palpatory exam revealed ttp at the lower lumbar paraspinals, bilateral PSIS, bilateral SI joints, bilateral ischial tuberosities, and R>L greater trochanters . There was no evidence of spasm. Multiple trigger points were noted.    ROM: WNL in back flexion, extension. Special/provocative testing:    SLR: negative for radicular pains bilaterally   Facet loading: + bilaterally   TTP at paraspinals:+ bilateral lumbar and sacral spine   Faber test: + low back pain bilaterally   FAIR test: + low back pain R>L   Gaenslen test: + R   SI joint loading: + bilaterally     Assessment & Plan:  Diana Giles is a 51 y.o. year old female  who  has a past medical history of Anxiety, Avascular necrosis of R hip (HCC), Bunion of left foot, COVID-19, DDD (degenerative disc disease), lumbar, Fibromyalgia, Herpes simplex virus (HSV) infection, HPV in female, Rheumatoid arthritis (HCC), Sinus node dysfunction (HCC), Thrombocytosis, and Vaginal Pap smear, abnormal.   They are presenting to PM&R clinic as a new patient for pain management evaluation. They were referred by Pollyann Savoy, MD  for treatment of chronic pain from OA, FM, and DDD pain. Exam consistent with multifactorial pain with differential including facet arthropathy, R L5-S1 radiculopathy, bilateral sacroiliitis, and myofascial pain.   Chronic bilateral low back pain with right-sided sciatica Assessment & Plan: Reviwed MRI 2022 with patient; ? R S1 radiculopathy on exam, however imaging without significant neuroforaminal or spinal stenosis.  Hx and prior 3 months relief from  ablation more consistent with facet arthropathy as primary pain generator.  Discussed possible repeat MBB in the future with Dr. Wynn Banker; defer at this time.     Fibromyalgia Assessment & Plan: Discussed continuing Duloxetine 60 mg BID vs. Weaning due to ? Benefit; will continue for now.  Prior was on only 1-2x daily gabapentin, never tried Lyrica. After reviewing 04/2022 labs for Crcl, will restart gabapentin 300 mg TID and have patient call in 1-2 weeks to report effect. Can increase dose vs. Switch to lyrica if needed.  Continue HEP; cannot accommodate aquatherapy with current job   Chronic pain syndrome Assessment & Plan: No pain contract or narcotics prescribed today d/t lack of perceived benefit in the past on Tramadol. Trialling non-narcotic medications and interventions for now. Can re-address in the future if needed.  I will have you follow up in 2-3 months to discuss pain control and next steps.   Please obtain records from your old pain provider in [place] and send them to our office prior to that visit.    Bilateral sacroiliitis [facility]) Assessment & Plan: Will schedule bilateral SI joint injections with Dr. Wynn Banker. Specified lidocaine ONLY due to Hx AVN of hip.    Other orders -     Gabapentin; Take 1 capsule (300 mg total) by mouth 3 (three) times daily.  Dispense: 90 capsule; Refill: 2  Angelina Sheriff, DO 09/15/2022

## 2022-09-15 NOTE — Assessment & Plan Note (Addendum)
Discussed continuing Duloxetine 60 mg BID vs. Weaning due to ? Benefit; will continue for now.  Prior was on only 1-2x daily gabapentin, never tried Lyrica. After reviewing 04/2022 labs for Crcl, will restart gabapentin 300 mg TID and have patient call in 1-2 weeks to report effect. Can increase dose vs. Switch to lyrica if needed.  Continue HEP; cannot accommodate aquatherapy with current job

## 2022-09-15 NOTE — Assessment & Plan Note (Addendum)
No pain contract or narcotics prescribed today d/t lack of perceived benefit in the past on Tramadol. Trialling non-narcotic medications and interventions for now. Can re-address in the future if needed.  I will have you follow up in 2-3 months to discuss pain control and next steps.   Please obtain records from your old pain provider in [place] and send them to our office prior to that visit.

## 2022-09-15 NOTE — Patient Instructions (Addendum)
I have prescribed you Gabapentin 300 mg tablets to take three times per day. Call me or message through MyChart in 1-2 weeks after starting medication to report any side effects or benefits, and we can discuss dose adjusting or switching to another medication at that time.  I have referred you to Dr. Wynn Banker for bilateral SI joint injections with lidocaine only.   I will have you follow up in 2-3 months to discuss pain control and next steps. Please obtain records from your old pain provider in [place] and send them to our office prior to that visit.

## 2022-09-16 ENCOUNTER — Encounter: Payer: Self-pay | Admitting: Physical Medicine and Rehabilitation

## 2022-09-27 ENCOUNTER — Encounter: Payer: Self-pay | Admitting: Neurology

## 2022-09-27 ENCOUNTER — Ambulatory Visit (INDEPENDENT_AMBULATORY_CARE_PROVIDER_SITE_OTHER): Payer: BLUE CROSS/BLUE SHIELD | Admitting: Neurology

## 2022-09-27 VITALS — BP 114/74 | HR 78 | Ht 60.0 in | Wt 192.4 lb

## 2022-09-27 DIAGNOSIS — R519 Headache, unspecified: Secondary | ICD-10-CM

## 2022-09-27 DIAGNOSIS — R351 Nocturia: Secondary | ICD-10-CM

## 2022-09-27 DIAGNOSIS — E669 Obesity, unspecified: Secondary | ICD-10-CM

## 2022-09-27 DIAGNOSIS — Z820 Family history of epilepsy and other diseases of the nervous system: Secondary | ICD-10-CM

## 2022-09-27 DIAGNOSIS — G4719 Other hypersomnia: Secondary | ICD-10-CM

## 2022-09-27 DIAGNOSIS — R0683 Snoring: Secondary | ICD-10-CM

## 2022-09-27 DIAGNOSIS — Z9884 Bariatric surgery status: Secondary | ICD-10-CM

## 2022-09-27 NOTE — Patient Instructions (Signed)

## 2022-09-27 NOTE — Progress Notes (Signed)
Subjective:    Patient ID: Diana Giles is a 51 y.o. female.  HPI    Huston Foley, MD, PhD [facility] [address], [address] [address] [place], [place] [address]  Dear Maralyn Sago,  I saw your patient, Diana Giles, upon your kind request in my sleep clinic today for initial consultation of her sleep disorder, in particular, concern for underlying obstructive sleep apnea.  The patient is unaccompanied today.  As you know, Diana Giles is a 51 year old female with an underlying medical history of rheumatoid arthritis, degenerative lumbar disc disease, anxiety, avascular necrosis of the hip, fibromyalgia, sinus node dysfunction with status post pacemaker placement, thrombocytosis, and obesity with status post bariatric surgery, who reports snoring and excessive daytime somnolence as well as nocturia. I reviewed your office note from 08/05/2022.  Her Epworth sleepiness score is 24, fatigue severity score is 50 out of 63.  She reports that her mother had sleep she has a brother with sleep apnea as well.  She has a family history of asthma.  Mom also had COPD.  Patient never smoked, she does not currently drink any alcohol, she drinks quite a bit of caffeine but is in the process of switching to decaf coffee and reducing her soda intake, currently limiting to 2 cans of soda and mixed decaf and caffeine containing coffee.  Of note, she is on quite a few medications including potentially sedating medications, she has been taking clonazepam twice daily, recently started gabapentin 3 times a day and has been on Cymbalta 60 mg twice daily.  She recently stopped taking hydroxyzine which she has been on for some time.  She is status post right total knee replacement.  She is separated.  She has 1 daughter and 1 son.  Her daughter is currently staying with her and patient's brother is also staying with her currently.  She goes to bed between 9 and 9:30 PM and rise time is between 6 and  6:30 AM.  She has nocturia about once per average night, occasional morning headaches.  She works as a Water quality scientist.   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    Mental disorder    Rheumatoid arthritis (HCC)    Sinus node dysfunction (HCC)    s/p pacemaker   Thrombocytosis    Vaginal Pap smear, abnormal     Her Past Surgical History Is Significant For: Past Surgical History:  Procedure Laterality Date   GASTRIC BYPASS  2010   285lbs at highest weight   HYSTEROSCOPY WITH D & C  08/22/2019   OTHER SURGICAL HISTORY  08/2019   Bilateral fallopian tube removal   PACEMAKER PLACEMENT  08/06/2020   REVISION TOTAL KNEE ARTHROPLASTY Right 06/01/2019   SHOULDER ARTHROSCOPY Left 2018   TUBAL LIGATION Bilateral 08/07/2019    Her Family History Is Significant For: Family History  Problem Relation Age of Onset   Hypertension Mother    Stroke Mother    Diabetes Father    Heart disease Father    Hyperthyroidism Sister    COPD Sister    Obesity Brother    Hypertension Brother    Anxiety disorder Brother    Obesity Brother  Diabetes Brother    Asthma Brother    Asthma Brother    Polycystic ovary syndrome Daughter    Asthma Son    Obesity Son    Colon cancer Cousin     Her Social History Is Significant For: Social History   Socioeconomic History   Marital status: Legally Separated    Spouse name: Not on file   Number of children: 2   Years of education: Not on file   Highest education level: Not on file  Occupational History   Not on file  Tobacco Use   Smoking status: Never    Passive exposure: Past   Smokeless tobacco: Never  Vaping Use   Vaping Use: Never used  Substance and Sexual Activity   Alcohol use: Yes    Comment:  Occasionally   Drug use: Never   Sexual activity: Not Currently    Birth control/protection: Surgical, Post-menopausal    Comment: tubal  Other Topics Concern   Not on file  Social History Narrative   Separated for 12 years,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   Caffiene coffee 2 cups.  Cokes 2 daily.   Social Determinants of Health   Financial Resource Strain: Medium Risk (03/13/2021)   Overall Financial Resource Strain (CARDIA)    Difficulty of Paying Living Expenses: Somewhat hard  Food Insecurity: No Food Insecurity (03/13/2021)   Hunger Vital Sign    Worried About Running Out of Food in the Last Year: Never true    Ran Out of Food in the Last Year: Never true  Transportation Needs: No Transportation Needs (03/13/2021)   PRAPARE - Administrator, Civil Service (Medical): No    Lack of Transportation (Non-Medical): No  Physical Activity: Inactive (03/13/2021)   Exercise Vital Sign    Days of Exercise per Week: 0 days    Minutes of Exercise per Session: 30 min  Stress: Stress Concern Present (03/13/2021)   [employer] of Occupational Health - Occupational Stress Questionnaire    Feeling of Stress : To some extent  Social Connections: Socially Isolated (03/13/2021)   Social Connection and Isolation Panel [NHANES]    Frequency of Communication with Friends and Family: Three times a week    Frequency of Social Gatherings with Friends and Family: Twice a week    Attends Religious Services: Never    Database administrator or Organizations: No    Attends Engineer, structural: Never    Marital Status: Separated    Her Allergies Are:  Allergies  Allergen Reactions   Nickel    Penicillins    Shellfish Allergy    Sulfa Antibiotics Rash  :   Her Current Medications Are:  Outpatient Encounter Medications as of 09/27/2022  Medication Sig   acetaminophen (TYLENOL) 500 MG tablet Take 500 mg by mouth every 6 (six) hours as needed.    amphetamine-dextroamphetamine (ADDERALL XR) 20 MG 24 hr capsule Take 20 mg by mouth daily as needed.   Cholecalciferol (VITAMIN D3) 125 MCG (5000 UT) CAPS Take 2 capsules by mouth in the morning and at bedtime. 2 in AM & 2 in PM. (10,000iu /2xday).   clonazePAM (KLONOPIN) 1 MG tablet Take 1 tablet (1 mg total) by mouth 2 (two) times daily as needed for anxiety.   DULoxetine (CYMBALTA) 60 MG capsule Take 1 capsule (60 mg total) by mouth 2 (two) times daily. Keep follow-up appt for future refills   EPINEPHrine 0.3 mg/0.3  mL IJ SOAJ injection Inject 0.3 mg into the muscle as needed for anaphylaxis.   gabapentin (NEURONTIN) 300 MG capsule Take 1 capsule (300 mg total) by mouth 3 (three) times daily.   hydrOXYzine (VISTARIL) 25 MG capsule Take 25 mg by mouth 3 (three) times daily as needed.   ibuprofen (ADVIL) 200 MG tablet Take 400 mg by mouth every 6 (six) hours as needed.   meloxicam (MOBIC) 15 MG tablet Take 15 mg by mouth daily as needed for pain.   ondansetron (ZOFRAN) 4 MG tablet Take 1 tablet (4 mg total) by mouth every 8 (eight) hours as needed for nausea or vomiting.   progesterone (PROMETRIUM) 200 MG capsule Take 1 daily for 21 days every month   valACYclovir (VALTREX) 500 MG tablet Take 1 tablet (500 mg total) by mouth 2 (two) times daily. (Patient taking differently: Take 500 mg by mouth as needed.)   vitamin B-12 (CYANOCOBALAMIN) 500 MCG tablet Take 500 mcg by mouth daily.   No facility-administered encounter medications on file as of 09/27/2022.  :   Review of Systems:  Out of a complete 14 point review of systems, all are reviewed and negative with the exception of these symptoms as listed below:   Review of Systems  Neurological:        Always tired. Falls asleep easily anytime. ESS 19, FSS 68. Snores when really tired.  Pt has Visual merchandiser.     Objective:  Neurological Exam  Physical Exam Physical Examination:   Vitals:   09/27/22 1406  BP: 114/74  Pulse: 78     General Examination: The patient is a very pleasant 51 y.o. female in no acute distress. She appears well-developed and well-nourished and well groomed.   HEENT: Normocephalic, atraumatic, pupils are equal, round and reactive to light, extraocular tracking is good without limitation to gaze excursion or nystagmus noted. Hearing is grossly intact. Face is symmetric with normal facial animation. Speech is clear with no dysarthria noted. There is no hypophonia. There is no lip, neck/head, jaw or voice tremor. Neck is supple with full range of passive and active motion. There are no carotid bruits on auscultation. Oropharynx exam reveals: Mild mouth dryness, good dental hygiene, mild airway crowding secondary to small airway entry, tonsillar size is normal, uvula is small, Mallampati class I, neck circumference 13.8 inches, minimal overbite.  Tongue protrudes centrally and palate elevates symmetrically.   Chest: Clear to auscultation without wheezing, rhonchi or crackles noted.  Heart: S1+S2+0, regular and normal without murmurs, rubs or gallops noted.   Abdomen: Soft, non-tender and non-distended.  Extremities: There is no pitting edema in the distal lower extremities bilaterally.  She is wearing footless compression socks up to the knees.  Skin: Warm and dry without trophic changes noted.   Musculoskeletal: exam reveals no obvious joint deformities.  Unremarkable right total knee replacement scar.  Neurologically:  Mental status: The patient is awake, alert and oriented in all 4 spheres. Her immediate and remote memory, attention, language skills and fund of knowledge are appropriate. There is no evidence of aphasia, agnosia, apraxia or anomia. Speech is clear with normal prosody and enunciation. Thought process is linear. Mood is normal and affect is normal.  Cranial nerves II - XII are as described above under HEENT exam.  Motor exam: Normal bulk, strength and tone is noted. There is no  obvious action or resting tremor.  Fine motor skills and coordination: grossly intact.  Cerebellar testing: No dysmetria or intention tremor. There  is no truncal or gait ataxia.  Sensory exam: intact to light touch in the upper and lower extremities.  Gait, station and balance: She stands easily. No veering to one side is noted. No leaning to one side is noted. Posture is age-appropriate and stance is narrow based. Gait shows normal stride length and normal pace. No problems turning are noted.   Assessment and Plan:  In summary, Diana Giles is a very pleasant 51 y.o.-year old female with an underlying medical history of rheumatoid arthritis, degenerative lumbar disc disease, anxiety, avascular necrosis of the hip, fibromyalgia, sinus node dysfunction with status post pacemaker placement, thrombocytosis, and obesity with status post bariatric surgery, whose history and physical exam are concerning for sleep disordered breathing, supporting a current working diagnosis of unspecified sleep apnea, with the main differential diagnoses of obstructive sleep apnea (OSA) versus upper airway resistance syndrome (UARS) versus central sleep apnea (CSA), or mixed sleep apnea. A laboratory attended sleep study is considered gold standard for evaluation of sleep disordered breathing and is recommended at this time and clinically justified.   I had a long chat with the patient about my findings and the diagnosis of sleep apnea, particularly OSA, its prognosis and treatment options. We talked about medical/conservative treatments, surgical interventions and non-pharmacological approaches for symptom control. I explained, in particular, the risks and ramifications of untreated moderate to severe OSA, especially with respect to developing cardiovascular disease down the road, including congestive heart failure (CHF), difficult to treat hypertension, cardiac arrhythmias (particularly A-fib), neurovascular complications  including TIA, stroke and dementia. Even type 2 diabetes has, in part, been linked to untreated OSA. Symptoms of untreated OSA may include (but may not be limited to) daytime sleepiness, nocturia (i.e. frequent nighttime urination), memory problems, mood irritability and suboptimally controlled or worsening mood disorder such as depression and/or anxiety, lack of energy, lack of motivation, physical discomfort, as well as recurrent headaches, especially morning or nocturnal headaches. We talked about the importance of maintaining a healthy lifestyle and striving for healthy weight. I recommended the following at this time: sleep study.  I outlined the differences between a laboratory attended sleep study which is considered more comprehensive and accurate over the option of a home sleep test (HST); the latter may lead to underestimation of sleep disordered breathing in some instances and does not help with diagnosing upper airway resistance syndrome and is not accurate enough to diagnose primary central sleep apnea typically. I explained the different sleep test procedures to the patient in detail and also outlined possible surgical and non-surgical treatment options of OSA, including the use of a pressure airway pressure (PAP) device (ie CPAP, AutoPAP/APAP or BiPAP in certain circumstances), a custom-made dental device (aka oral appliance, which would require a referral to a specialist dentist or orthodontist typically, and is generally speaking not considered a good choice for patients with full dentures or edentulous state), upper airway surgical options, such as traditional UPPP (which is not considered a first-line treatment) or the Inspire device (hypoglossal nerve stimulator, which would involve a referral for consultation with an ENT surgeon, after careful selection, following inclusion criteria). I explained the PAP treatment option to the patient in detail, as this is generally considered first-line  treatment.  The patient indicated that she would be willing to try PAP therapy, if the need arises. I explained the importance of being compliant with PAP treatment, not only for insurance purposes but primarily to improve patient's symptoms symptoms, and for the patient's long term health  benefit, including to reduce Her cardiovascular risks longer-term.    We will pick up our discussion about the next steps and treatment options after testing.  We will keep her posted as to the test results by phone call and/or MyChart messaging where possible.  We will plan to follow-up in sleep clinic accordingly as well.  I answered all her questions today and the patient was in agreement.   I encouraged her to call with any interim questions, concerns, problems or updates or email [place] through [place].  Generally speaking, sleep test authorizations may take up to 2 weeks, sometimes less, sometimes longer, the patient is encouraged to get in touch with [place] if they do not hear back from the sleep lab staff directly within the next 2 weeks.  Thank you very much for allowing me to participate in the care of this nice patient. If I can be of any further assistance to you please do not hesitate to call me at [phone].  Sincerely,   Star Age, MD, PhD

## 2022-09-28 NOTE — Progress Notes (Deleted)
Office Visit Note  Patient: Diana Giles             Date of Birth: 1971/06/25           MRN: [id number]             PCP: [name], NP Referring: [name], NP Visit Date: 10/12/2022 Occupation: @GUAROCC @  Subjective:    History of Present Illness: Diana Giles is a 51 y.o. female with a history of osteoarthritis, fibromyalgia, and DDD.  She is taking cymbalta 60 mg twice daily.   Activities of Daily Living:  Patient reports morning stiffness for *** {minute/hour:19697}.   Patient {ACTIONS;DENIES/REPORTS:21021675::"Denies"} nocturnal pain.  Difficulty dressing/grooming: {ACTIONS;DENIES/REPORTS:21021675::"Denies"} Difficulty climbing stairs: {ACTIONS;DENIES/REPORTS:21021675::"Denies"} Difficulty getting out of chair: {ACTIONS;DENIES/REPORTS:21021675::"Denies"} Difficulty using hands for taps, buttons, cutlery, and/or writing: {ACTIONS;DENIES/REPORTS:21021675::"Denies"}  No Rheumatology ROS completed.   PMFS History:  Patient Active Problem List   Diagnosis Date Noted   Chronic bilateral low back pain with right-sided sciatica 09/15/2022   Arthritis pain 09/15/2022   Fibromyalgia 09/15/2022   Chronic pain syndrome 09/15/2022   Bilateral sacroiliitis (HCC) 09/15/2022   Routine Papanicolaou smear 08/09/2022   Postmenopause 08/09/2022   Low TSH level 08/05/2022   Snoring 08/05/2022   Boil of buttock 07/26/2022   Pacemaker 01/07/2021   Rheumatoid arthritis (HCC) 11/13/2020   Fatigue 11/13/2020   Class 2 obesity with body mass index (BMI) of 37.0 to 37.9 in adult 11/13/2020   DDD (degenerative disc disease), lumbar 11/13/2020   Avascular necrosis of bone of hip, right (HCC) 11/13/2020   Sinus node dysfunction (HCC) 11/13/2020   Thrombocytosis 11/13/2020   Anxiety 11/13/2020   Attention deficit hyperactivity disorder (ADHD) 11/13/2020    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HCC)    Bunion of left foot     COVID-19    DDD (degenerative disc disease), lumbar    Fibromyalgia    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    Mental disorder    Rheumatoid arthritis (HCC)    Sinus node dysfunction (HCC)    s/p pacemaker   Thrombocytosis    Vaginal Pap smear, abnormal     Family History  Problem Relation Age of Onset   Hypertension Mother    Stroke Mother    Diabetes Father    Heart disease Father    Hyperthyroidism Sister    COPD Sister    Obesity Brother    Hypertension Brother    Anxiety disorder Brother    Obesity Brother    Diabetes Brother    Asthma Brother    Asthma Brother    Polycystic ovary syndrome Daughter    Asthma Son    Obesity Son    Colon cancer Cousin    Past Surgical History:  Procedure Laterality Date   GASTRIC BYPASS  2010   285lbs at highest weight   HYSTEROSCOPY WITH D & C  08/22/2019   OTHER SURGICAL HISTORY  08/2019   Bilateral fallopian tube removal   PACEMAKER PLACEMENT  08/06/2020   REVISION TOTAL KNEE ARTHROPLASTY Right 06/01/2019   SHOULDER ARTHROSCOPY Left 2018   TUBAL LIGATION Bilateral 08/07/2019   Social History   Social History Narrative   Separated for 12 years,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   Work for [employer].   Caffiene coffee 2 cups.  Cokes 2 daily.   Immunization History  Administered Date(s) Administered   Influenza,inj,Quad  PF,6+ Mos 08/05/2022   Influenza-Unspecified 07/31/2020, 07/07/2021   PFIZER(Purple Top)SARS-COV-2 Vaccination 02/09/2020, 03/01/2020, 09/18/2020   Tdap 12/17/2020   Zoster Recombinat (Shingrix) 09/23/2020     Objective: Vital Signs: There were no vitals taken for this visit.   Physical Exam Vitals and nursing note reviewed.  Constitutional:      Appearance: She is well-developed.  HENT:     Head: Normocephalic and atraumatic.  Eyes:     Conjunctiva/sclera: Conjunctivae normal.  Cardiovascular:     Rate and Rhythm: Normal rate and regular rhythm.     Heart sounds:  Normal heart sounds.  Pulmonary:     Effort: Pulmonary effort is normal.     Breath sounds: Normal breath sounds.  Abdominal:     General: Bowel sounds are normal.     Palpations: Abdomen is soft.  Musculoskeletal:     Cervical back: Normal range of motion.  Skin:    General: Skin is warm and dry.     Capillary Refill: Capillary refill takes less than 2 seconds.  Neurological:     Mental Status: She is alert and oriented to person, place, and time.  Psychiatric:        Behavior: Behavior normal.      Musculoskeletal Exam: ***  CDAI Exam: CDAI Score: -- Patient Global: --; Provider Global: -- Swollen: --; Tender: -- Joint Exam 10/12/2022   No joint exam has been documented for this visit   There is currently no information documented on the homunculus. Go to the Rheumatology activity and complete the homunculus joint exam.  Investigation: No additional findings.  Imaging: No results found.  Recent Labs: Lab Results  Component Value Date   WBC 10.4 02/03/2022   HGB 11.8 (L) 02/03/2022   PLT 376.0 02/03/2022   NA 144 04/15/2022   K 4.0 04/15/2022   CL 110 04/15/2022   CO2 23 04/15/2022   GLUCOSE 40 (L) 04/15/2022   BUN 13 04/15/2022   CREATININE 0.80 04/15/2022   BILITOT 0.5 04/15/2022   ALKPHOS 116 02/03/2022   AST 14 04/15/2022   ALT 8 04/15/2022   PROT 6.9 04/15/2022   ALBUMIN 3.9 02/03/2022   CALCIUM 9.2 04/15/2022   GFRAA 121 04/09/2021    Speciality Comments: No specialty comments available.  Procedures:  No procedures performed Allergies: Nickel, Penicillins, Shellfish allergy, and Sulfa antibiotics   Assessment / Plan:     Visit Diagnoses: No diagnosis found.  Orders: No orders of the defined types were placed in this encounter.  No orders of the defined types were placed in this encounter.   Face-to-face time spent with patient was *** minutes. Greater than 50% of time was spent in counseling and coordination of care.  Follow-Up  Instructions: No follow-ups on file.   Ellen Henri, CMA  Note - This record has been created using Animal nutritionist.  Chart creation errors have been sought, but may not always  have been located. Such creation errors do not reflect on  the standard of medical care.

## 2022-10-01 ENCOUNTER — Other Ambulatory Visit: Payer: Self-pay | Admitting: Nurse Practitioner

## 2022-10-01 DIAGNOSIS — F419 Anxiety disorder, unspecified: Secondary | ICD-10-CM

## 2022-10-05 ENCOUNTER — Telehealth: Payer: Self-pay | Admitting: Neurology

## 2022-10-05 NOTE — Telephone Encounter (Signed)
Sent mychart message

## 2022-10-06 ENCOUNTER — Other Ambulatory Visit: Payer: Self-pay | Admitting: Nurse Practitioner

## 2022-10-06 DIAGNOSIS — F419 Anxiety disorder, unspecified: Secondary | ICD-10-CM

## 2022-10-06 MED ORDER — CLONAZEPAM 1 MG PO TABS: 1.0000 mg | ORAL_TABLET | Freq: Two times a day (BID) | ORAL | 0 refills | Status: DC | PRN

## 2022-10-06 NOTE — Telephone Encounter (Signed)
Duplicate request. 1st one was sent to Dr. Lawerance Bach for approval in absence of PCP.Marland KitchenRaechel Chute

## 2022-10-06 NOTE — Telephone Encounter (Signed)
Check [facility] registry last filled 08/20/2022. Diana Giles is out the office this week pls advise on refill.Marland KitchenRaechel Chute

## 2022-10-12 ENCOUNTER — Ambulatory Visit: Payer: BLUE CROSS/BLUE SHIELD | Admitting: Physician Assistant

## 2022-10-12 DIAGNOSIS — M797 Fibromyalgia: Secondary | ICD-10-CM

## 2022-10-12 DIAGNOSIS — D75839 Thrombocytosis, unspecified: Secondary | ICD-10-CM

## 2022-10-12 DIAGNOSIS — M1712 Unilateral primary osteoarthritis, left knee: Secondary | ICD-10-CM

## 2022-10-12 DIAGNOSIS — M7701 Medial epicondylitis, right elbow: Secondary | ICD-10-CM

## 2022-10-12 DIAGNOSIS — M7061 Trochanteric bursitis, right hip: Secondary | ICD-10-CM

## 2022-10-12 DIAGNOSIS — R5383 Other fatigue: Secondary | ICD-10-CM

## 2022-10-12 DIAGNOSIS — Z8659 Personal history of other mental and behavioral disorders: Secondary | ICD-10-CM

## 2022-10-12 DIAGNOSIS — M5136 Other intervertebral disc degeneration, lumbar region: Secondary | ICD-10-CM

## 2022-10-12 DIAGNOSIS — G8929 Other chronic pain: Secondary | ICD-10-CM

## 2022-10-12 DIAGNOSIS — I495 Sick sinus syndrome: Secondary | ICD-10-CM

## 2022-10-12 DIAGNOSIS — M87051 Idiopathic aseptic necrosis of right femur: Secondary | ICD-10-CM

## 2022-10-12 DIAGNOSIS — M19041 Primary osteoarthritis, right hand: Secondary | ICD-10-CM

## 2022-10-12 DIAGNOSIS — Z96651 Presence of right artificial knee joint: Secondary | ICD-10-CM

## 2022-10-12 DIAGNOSIS — Z9884 Bariatric surgery status: Secondary | ICD-10-CM

## 2022-10-12 DIAGNOSIS — M19071 Primary osteoarthritis, right ankle and foot: Secondary | ICD-10-CM

## 2022-10-12 DIAGNOSIS — Z950 Presence of cardiac pacemaker: Secondary | ICD-10-CM

## 2022-11-02 ENCOUNTER — Ambulatory Visit: Payer: No Typology Code available for payment source | Admitting: Physical Medicine & Rehabilitation

## 2022-11-03 ENCOUNTER — Ambulatory Visit (INDEPENDENT_AMBULATORY_CARE_PROVIDER_SITE_OTHER): Payer: No Typology Code available for payment source

## 2022-11-03 DIAGNOSIS — I495 Sick sinus syndrome: Secondary | ICD-10-CM

## 2022-11-04 ENCOUNTER — Ambulatory Visit: Payer: No Typology Code available for payment source | Admitting: Nurse Practitioner

## 2022-11-04 LAB — CUP PACEART REMOTE DEVICE CHECK
Battery Remaining Longevity: 155 mo
Battery Voltage: 3.03 V
Brady Statistic AP VP Percent: 0.04 %
Brady Statistic AP VS Percent: 8.61 %
Brady Statistic AS VP Percent: 0.04 %
Brady Statistic AS VS Percent: 91.31 %
Brady Statistic RA Percent Paced: 8.7 %
Brady Statistic RV Percent Paced: 0.08 %
Date Time Interrogation Session: 20231226230851
Implantable Lead Connection Status: 753985
Implantable Lead Connection Status: 753985
Implantable Lead Implant Date: 20210928
Implantable Lead Implant Date: 20210928
Implantable Lead Location: 753859
Implantable Lead Location: 753860
Implantable Lead Model: 4076
Implantable Lead Model: 4076
Implantable Pulse Generator Implant Date: 20210928
Lead Channel Impedance Value: 323 Ohm
Lead Channel Impedance Value: 380 Ohm
Lead Channel Impedance Value: 475 Ohm
Lead Channel Impedance Value: 532 Ohm
Lead Channel Pacing Threshold Amplitude: 0.5 V
Lead Channel Pacing Threshold Amplitude: 1.125 V
Lead Channel Pacing Threshold Pulse Width: 0.4 ms
Lead Channel Pacing Threshold Pulse Width: 0.4 ms
Lead Channel Sensing Intrinsic Amplitude: 3.75 mV
Lead Channel Sensing Intrinsic Amplitude: 3.75 mV
Lead Channel Sensing Intrinsic Amplitude: 6.75 mV
Lead Channel Sensing Intrinsic Amplitude: 6.75 mV
Lead Channel Setting Pacing Amplitude: 2 V
Lead Channel Setting Pacing Amplitude: 2.5 V
Lead Channel Setting Pacing Pulse Width: 0.4 ms
Lead Channel Setting Sensing Sensitivity: 0.9 mV
Zone Setting Status: 755011

## 2022-11-09 ENCOUNTER — Encounter: Payer: Self-pay | Admitting: Nurse Practitioner

## 2022-11-10 ENCOUNTER — Other Ambulatory Visit: Payer: Self-pay

## 2022-11-10 ENCOUNTER — Ambulatory Visit: Admit: 2022-11-10 | Discharge status: Against Medical Advice | Payer: BLUE CROSS/BLUE SHIELD

## 2022-11-10 ENCOUNTER — Emergency Department: Payer: BLUE CROSS/BLUE SHIELD

## 2022-11-10 ENCOUNTER — Emergency Department
Admission: EM | Admit: 2022-11-10 | Discharge: 2022-11-10 | Disposition: A | Discharge status: Home / Self Care | Payer: BLUE CROSS/BLUE SHIELD | Attending: Emergency Medicine | Admitting: Emergency Medicine

## 2022-11-10 DIAGNOSIS — R102 Pelvic and perineal pain: Secondary | ICD-10-CM | POA: Insufficient documentation

## 2022-11-10 DIAGNOSIS — R109 Unspecified abdominal pain: Secondary | ICD-10-CM

## 2022-11-10 LAB — CBC
HCT: 37.2 % (ref 36.0–46.0)
Hemoglobin: 11.8 g/dL — ABNORMAL LOW (ref 12.0–15.0)
MCH: 28.6 pg (ref 26.0–34.0)
MCHC: 31.7 g/dL (ref 30.0–36.0)
MCV: 90.3 fL (ref 80.0–100.0)
Platelets: 422 10*3/uL — ABNORMAL HIGH (ref 150–400)
RBC: 4.12 MIL/uL (ref 3.87–5.11)
RDW: 13.6 % (ref 11.5–15.5)
WBC: 6.8 10*3/uL (ref 4.0–10.5)
nRBC: 0 % (ref 0.0–0.2)

## 2022-11-10 LAB — BASIC METABOLIC PANEL
Anion gap: 8 (ref 5–15)
BUN: 16 mg/dL (ref 6–20)
CO2: 24 mmol/L (ref 22–32)
Calcium: 8.8 mg/dL — ABNORMAL LOW (ref 8.9–10.3)
Chloride: 107 mmol/L (ref 98–111)
Creatinine, Ser: 0.57 mg/dL (ref 0.44–1.00)
GFR, Estimated: >60 mL/min (ref >60)
Glucose, Bld: 90 mg/dL (ref 70–99)
Potassium: 4 mmol/L (ref 3.5–5.1)
Sodium: 139 mmol/L (ref 135–145)

## 2022-11-10 LAB — URINALYSIS, ROUTINE W REFLEX MICROSCOPIC
Bilirubin Urine: NEGATIVE
Glucose, UA: NEGATIVE mg/dL
Hgb urine dipstick: NEGATIVE
Ketones, ur: NEGATIVE mg/dL
Leukocytes,Ua: NEGATIVE
Nitrite: NEGATIVE
Protein, ur: NEGATIVE mg/dL
Specific Gravity, Urine: 1.014 (ref 1.005–1.030)
pH: 6 (ref 5.0–8.0)

## 2022-11-10 MED ORDER — MELOXICAM 15 MG PO TABS: 15.0000 mg | ORAL_TABLET | Freq: Every day | ORAL | 0 refills | Status: AC

## 2022-11-10 MED ORDER — CYCLOBENZAPRINE HCL 10 MG PO TABS: 10.0000 mg | ORAL_TABLET | Freq: Three times a day (TID) | ORAL | 0 refills | Status: DC | PRN

## 2022-11-10 NOTE — Progress Notes (Deleted)
Office Visit Note  Patient: Diana Giles             Date of Birth: [birth year]           MRN: [id number]             PCP: Ailene Ards, NP Referring: Ailene Ards, NP Visit Date: 11/19/2022 Occupation: @GUAROCC$ @  Subjective:    History of Present Illness: Diana Giles is a 52 y.o. female with history of fibromyalgia, osteoarthritis, and DDD.   Gabapentin  Flexeril  Cymbalta   Activities of Daily Living:  Patient reports morning stiffness for *** {minute/hour:19697}.   Patient {ACTIONS;DENIES/REPORTS:21021675::"Denies"} nocturnal pain.  Difficulty dressing/grooming: {ACTIONS;DENIES/REPORTS:21021675::"Denies"} Difficulty climbing stairs: {ACTIONS;DENIES/REPORTS:21021675::"Denies"} Difficulty getting out of chair: {ACTIONS;DENIES/REPORTS:21021675::"Denies"} Difficulty using hands for taps, buttons, cutlery, and/or writing: {ACTIONS;DENIES/REPORTS:21021675::"Denies"}  No Rheumatology ROS completed.   PMFS History:  Patient Active Problem List   Diagnosis Date Noted   Chronic bilateral low back pain with right-sided sciatica 09/15/2022   Arthritis pain 09/15/2022   Fibromyalgia 09/15/2022   Chronic pain syndrome 09/15/2022   Bilateral sacroiliitis ([place]) 09/15/2022   Routine Papanicolaou smear 08/09/2022   Postmenopause 08/09/2022   Low TSH level 08/05/2022   Snoring 08/05/2022   Boil of buttock 07/26/2022   Pacemaker 01/07/2021   Rheumatoid arthritis ([place]) 11/13/2020   Fatigue 11/13/2020   Class 2 obesity with body mass index (BMI) of 37.0 to 37.9 in adult 11/13/2020   DDD (degenerative disc disease), lumbar 11/13/2020   Avascular necrosis of bone of hip, right ([place]) 11/13/2020   Sinus node dysfunction ([place]) 11/13/2020   Thrombocytosis 11/13/2020   Anxiety 11/13/2020   Attention deficit hyperactivity disorder (ADHD) 11/13/2020    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[place])    Bunion of left foot    COVID-19     DDD (degenerative disc disease), lumbar    Fibromyalgia    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    Mental disorder    Rheumatoid arthritis ([place])    Sinus node dysfunction ([place])    s/p pacemaker   Thrombocytosis    Vaginal Pap smear, abnormal     Family History  Problem Relation Age of Onset   Hypertension Mother    Stroke Mother    Diabetes Father    Heart disease Father    Hyperthyroidism Sister    COPD Sister    Obesity Brother    Hypertension Brother    Anxiety disorder Brother    Obesity Brother    Diabetes Brother    Asthma Brother    Asthma Brother    Polycystic ovary syndrome Daughter    Asthma Son    Obesity Son    Colon cancer Cousin    Past Surgical History:  Procedure Laterality Date   GASTRIC BYPASS  2010   285lbs at highest weight   HYSTEROSCOPY WITH D & C  08/22/2019   OTHER SURGICAL HISTORY  08/2019   Bilateral fallopian tube removal   PACEMAKER PLACEMENT  08/06/2020   REVISION TOTAL KNEE ARTHROPLASTY Right 06/01/2019   SHOULDER ARTHROSCOPY Left 2018   TUBAL LIGATION Bilateral 08/07/2019   Social History   Social History Narrative   Separated for 12 years,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   Work for [employer].   Caffiene coffee 2 cups.  Cokes 2 daily.   Immunization History  Administered Date(s) Administered   Influenza,inj,Quad PF,6+ Mos 08/05/2022  Influenza-Unspecified 07/31/2020, 07/07/2021   PFIZER(Purple Top)SARS-COV-2 Vaccination 02/09/2020, 03/01/2020, 09/18/2020   Tdap 12/17/2020   Zoster Recombinat (Shingrix) 09/23/2020     Objective: Vital Signs: There were no vitals taken for this visit.   Physical Exam Vitals and nursing note reviewed.  Constitutional:      Appearance: She is well-developed.  HENT:     Head: Normocephalic and atraumatic.  Eyes:     Conjunctiva/sclera: Conjunctivae normal.  Cardiovascular:     Rate and Rhythm: Normal rate and regular rhythm.     Heart sounds: Normal heart  sounds.  Pulmonary:     Effort: Pulmonary effort is normal.     Breath sounds: Normal breath sounds.  Abdominal:     General: Bowel sounds are normal.     Palpations: Abdomen is soft.  Musculoskeletal:     Cervical back: Normal range of motion.  Skin:    General: Skin is warm and dry.     Capillary Refill: Capillary refill takes less than 2 seconds.  Neurological:     Mental Status: She is alert and oriented to person, place, and time.  Psychiatric:        Behavior: Behavior normal.      Musculoskeletal Exam: ***  CDAI Exam: CDAI Score: -- Patient Global: --; Provider Global: -- Swollen: --; Tender: -- Joint Exam 11/19/2022   No joint exam has been documented for this visit   There is currently no information documented on the homunculus. Go to the Rheumatology activity and complete the homunculus joint exam.  Investigation: No additional findings.  Imaging: CUP PACEART REMOTE DEVICE CHECK  Result Date: 11/04/2022 Scheduled remote reviewed. Normal device function.  Next remote 91 days. Kathy Breach, RN, CCDS, [employer]   Recent Labs: Lab Results  Component Value Date   WBC 10.4 02/03/2022   HGB 11.8 (L) 02/03/2022   PLT 376.0 02/03/2022   NA 144 04/15/2022   K 4.0 04/15/2022   CL 110 04/15/2022   CO2 23 04/15/2022   GLUCOSE 40 (L) 04/15/2022   BUN 13 04/15/2022   CREATININE 0.80 04/15/2022   BILITOT 0.5 04/15/2022   ALKPHOS 116 02/03/2022   AST 14 04/15/2022   ALT 8 04/15/2022   PROT 6.9 04/15/2022   ALBUMIN 3.9 02/03/2022   CALCIUM 9.2 04/15/2022   GFRAA 121 04/09/2021    Speciality Comments: No specialty comments available.  Procedures:  No procedures performed Allergies: Nickel, Penicillins, Shellfish allergy, and Sulfa antibiotics   Assessment / Plan:     Visit Diagnoses: No diagnosis found.  Orders: No orders of the defined types were placed in this encounter.  No orders of the defined types were placed in this  encounter.   Face-to-face time spent with patient was *** minutes. Greater than 50% of time was spent in counseling and coordination of care.  Follow-Up Instructions: No follow-ups on file.   Earnestine Mealing, CMA  Note - This record has been created using Editor, commissioning.  Chart creation errors have been sought, but may not always  have been located. Such creation errors do not reflect on  the standard of medical care.

## 2022-11-10 NOTE — ED Notes (Signed)
Pt to CT now

## 2022-11-10 NOTE — ED Provider Notes (Signed)
[facility] Provider Note   Event Date/Time   First MD Initiated Contact with Patient 11/10/22 1259     (approximate) History  Flank Pain  HPI Gabbi Whetstone is a 52 y.o. female with stated past medical history of kidney stones who presents for left flank pain is described as a dull, aching 7/10 left flank pain that does not radiate and has been present over the last week after trying to move a fridge.  Patient states the pain is somewhat similar to kidney stone pain that she has had in the past but it is more constant than intermittent. ROS: Patient currently denies any vision changes, tinnitus, difficulty speaking, facial droop, sore throat, chest pain, shortness of breath, nausea/vomiting/diarrhea, dysuria, or weakness/numbness/paresthesias in any extremity   Physical Exam  Triage Vital Signs: ED Triage Vitals [11/10/22 1218]  Enc Vitals Group     BP 120/73     Pulse Rate 66     Resp 16     Temp 98.5 F (36.9 C)     Temp Source Oral     SpO2 97 %     Weight 192 lb 7.4 oz (87.3 kg)     Height 5' (1.524 m)     Head Circumference      Peak Flow      Pain Score 7     Pain Loc      Pain Edu?      Excl. in [place]?    Most recent vital signs: Vitals:   11/10/22 1218 11/10/22 1330  BP: 120/73 113/72  Pulse: 66 (!) 56  Resp: 16 15  Temp: 98.5 F (36.9 C)   SpO2: 97% 97%   General: Awake, oriented x4. CV:  Good peripheral perfusion.  Resp:  Normal effort.  Abd:  No distention.  Other:  Obese middle-aged Hispanic female laying in bed in no acute distress.  Tenderness to palpation over left lower lateral rib cage ED Results / Procedures / Treatments  Labs (all labs ordered are listed, but only abnormal results are displayed) Labs Reviewed  BASIC METABOLIC PANEL - Abnormal; Notable for the following components:      Result Value   Calcium 8.8 (*)    All other components within normal limits  CBC - Abnormal; Notable for the following components:    Hemoglobin 11.8 (*)    Platelets 422 (*)    All other components within normal limits  URINALYSIS, ROUTINE W REFLEX MICROSCOPIC - Abnormal; Notable for the following components:   Color, Urine YELLOW (*)    APPearance CLEAR (*)    All other components within normal limits  POC URINE PREG, ED  RADIOLOGY ED MD interpretation: CT of the abdomen and pelvis without IV contrast shows no evidence of acute abnormalities  CT of the head without contrast interpreted by me shows no evidence of acute abnormalities including no intracerebral hemorrhage, obvious masses, or significant edema -Agree with radiology assessment Official radiology report(s): CT Renal Stone Study  Result Date: 11/10/2022 CLINICAL DATA:  Abdominal and flank pain, stone suspected, left-sided flank pain for 1 week EXAM: CT ABDOMEN AND PELVIS WITHOUT CONTRAST TECHNIQUE: Multidetector CT imaging of the abdomen and pelvis was performed following the standard protocol without IV contrast. RADIATION DOSE REDUCTION: This exam was performed according to the departmental dose-optimization program which includes automated exposure control, adjustment of the mA and/or kV according to patient size and/or use of iterative reconstruction technique. COMPARISON:  02/03/2022 FINDINGS: Lower chest: No acute  abnormality.  Small hiatal hernia. Hepatobiliary: No solid liver abnormality is seen. No gallstones, gallbladder wall thickening, or biliary dilatation. Pancreas: Unremarkable. No pancreatic ductal dilatation or surrounding inflammatory changes. Spleen: Normal in size without significant abnormality. Adrenals/Urinary Tract: Adrenal glands are unremarkable. Kidneys are normal, without renal calculi, solid lesion, or hydronephrosis. Bladder is unremarkable. Stomach/Bowel: Status post Roux type gastric bypass. Appendix appears normal. No evidence of bowel wall thickening, distention, or inflammatory changes. Vascular/Lymphatic: No significant vascular  findings are present. No enlarged abdominal or pelvic lymph nodes. Reproductive: No mass or other significant abnormality. Other: No abdominal wall hernia or abnormality. No ascites. Musculoskeletal: No acute or significant osseous findings. 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 IMPRESSION: 1. No acute noncontrast CT findings of the abdomen or pelvis to explain left flank pain. No urinary tract calculi or hydronephrosis. 2. Status post Roux type gastric bypass. 3. Small hiatal hernia. Electronically Signed   By: Delanna Ahmadi M.D.   On: 11/10/2022 13:05   CT HEAD WO CONTRAST (5MM)  Result Date: 11/10/2022 CLINICAL DATA:  Headache, head trauma EXAM: CT HEAD WITHOUT CONTRAST TECHNIQUE: Contiguous axial images were obtained from the base of the skull through the vertex without intravenous contrast. RADIATION DOSE REDUCTION: This exam was performed according to the departmental dose-optimization program which includes automated exposure control, adjustment of the mA and/or kV according to patient size and/or use of iterative reconstruction technique. COMPARISON:  None Available. FINDINGS: Brain: No evidence of acute infarction, hemorrhage, mass, mass effect, or midline shift. No hydrocephalus or extra-axial fluid collection. Vascular: No hyperdense vessel. Skull: Normal. Negative for fracture or focal lesion. Sinuses/Orbits: No acute finding. Other: None. IMPRESSION: No acute intracranial process. Electronically Signed   By: Merilyn Baba M.D.   On: 11/10/2022 12:58   PROCEDURES: Critical Care performed: No Procedures MEDICATIONS ORDERED IN ED: Medications - No data to display IMPRESSION / MDM / [place] / ED COURSE  I reviewed the triage vital signs and the nursing notes.                              Patient's  presentation is most consistent with acute presentation with potential threat to life or bodily function. Patients symptoms not typical for emergent causes of abdominal pain such as, but not limited to, appendicitis, abdominal aortic aneurysm, surgical biliary disease, pancreatitis, SBO, mesenteric ischemia, serious intra-abdominal bacterial illness. Presentation also not typical of gynecologic emergencies such as TOA, Ovarian Torsion, PID. Not Ectopic. Doubt atypical ACS.  Pt tolerating PO. Disposition: Patient will be discharged with strict return precautions and follow up with primary MD within 12-24 hours for further evaluation. Patient understands that this still may have an early presentation of an emergent medical condition such as appendicitis that will require a recheck.   FINAL CLINICAL IMPRESSION(S) / ED DIAGNOSES   Final diagnoses:  Left flank pain   Rx / DC Orders   ED Discharge Orders          Ordered    cyclobenzaprine (FLEXERIL) 10 MG tablet  3 times daily PRN        11/10/22 1414    meloxicam (MOBIC) 15 MG tablet  Daily        11/10/22 1414           Note:  This  document was prepared using Systems analyst and may include unintentional dictation errors.   Naaman Plummer, MD 11/10/22 [id number]

## 2022-11-10 NOTE — ED Notes (Signed)
Pt to ED for "nagging, gnawing" constant pain to L back/flank area since 12/24. Pt unsure if could be muscle strain. Pt also hit head same day and has had intermittent  HA since. Pt wanted to be checked because has hx renal stones and infx. Pt states started taking gapapentin about 1 month ago and also has DDD and chronic pain.

## 2022-11-10 NOTE — ED Triage Notes (Signed)
Pt here with left side flank pain x1 week. Pt states pain is constant and increasing in pressure. Pt has hx of kidney stones. Pt also c/o headache after hitting her head on her fridge Christmas Eve.

## 2022-11-10 NOTE — ED Notes (Signed)
Pt back from CT. Pt appears calm, in no acute distress. Urine already resulted.

## 2022-11-11 ENCOUNTER — Ambulatory Visit: Payer: No Typology Code available for payment source

## 2022-11-11 ENCOUNTER — Telehealth: Payer: Self-pay

## 2022-11-11 NOTE — Telephone Encounter (Signed)
Transition Care Management Unsuccessful Follow-up Telephone Call  Date of discharge and from where:  11/10/2022 [facility]    Attempts:  1st Attempt  Reason for unsuccessful TCM follow-up call:  Left voice message

## 2022-11-12 ENCOUNTER — Ambulatory Visit: Payer: No Typology Code available for payment source | Admitting: Nurse Practitioner

## 2022-11-17 NOTE — Telephone Encounter (Signed)
LVM asking patient to call [facility] if she is interested in scheduling sleep study.

## 2022-11-17 NOTE — Progress Notes (Deleted)
  PROCEDURE RECORD [facility]   Name: Gloriann Riede DOB:[birth year] MRN: [id number]  Date:11/17/2022  Physician: Alysia Penna, MD    Nurse/CMA: Jorja Loa MA  Allergies:  Allergies  Allergen Reactions   Nickel    Penicillins    Shellfish Allergy    Sulfa Antibiotics Rash    Consent Signed: {yes no:314532}  Is patient diabetic? {yes no:314532}  CBG today? ***  Pregnant: {yes no:314532} LMP: No LMP recorded. Patient is postmenopausal. (age 52-55)  Anticoagulants: {Yes/No:19989} Anti-inflammatory: {Yes/No:19989} Antibiotics: {Yes/No:19989}  Procedure: ***  Position: {PRONE/SUPINE/SITTING/LATERAL:21022916} Start Time: ***  End Time: ***  Fluoro Time: ***  RN/CMA      Time      BP      Pulse      Respirations      O2 Sat      S/S      Pain Level       D/C home with ***, patient A & O X 3, D/C instructions reviewed, and sits independently.

## 2022-11-19 ENCOUNTER — Encounter: Payer: BC Managed Care – PPO | Admitting: Physical Medicine & Rehabilitation

## 2022-11-19 ENCOUNTER — Ambulatory Visit: Payer: BLUE CROSS/BLUE SHIELD | Admitting: Physician Assistant

## 2022-11-19 DIAGNOSIS — M87051 Idiopathic aseptic necrosis of right femur: Secondary | ICD-10-CM

## 2022-11-19 DIAGNOSIS — D75839 Thrombocytosis, unspecified: Secondary | ICD-10-CM

## 2022-11-19 DIAGNOSIS — Z950 Presence of cardiac pacemaker: Secondary | ICD-10-CM

## 2022-11-19 DIAGNOSIS — M1712 Unilateral primary osteoarthritis, left knee: Secondary | ICD-10-CM

## 2022-11-19 DIAGNOSIS — M7061 Trochanteric bursitis, right hip: Secondary | ICD-10-CM

## 2022-11-19 DIAGNOSIS — M19071 Primary osteoarthritis, right ankle and foot: Secondary | ICD-10-CM

## 2022-11-19 DIAGNOSIS — Z9884 Bariatric surgery status: Secondary | ICD-10-CM

## 2022-11-19 DIAGNOSIS — M19041 Primary osteoarthritis, right hand: Secondary | ICD-10-CM

## 2022-11-19 DIAGNOSIS — M5136 Other intervertebral disc degeneration, lumbar region: Secondary | ICD-10-CM

## 2022-11-19 DIAGNOSIS — Z8659 Personal history of other mental and behavioral disorders: Secondary | ICD-10-CM

## 2022-11-19 DIAGNOSIS — M797 Fibromyalgia: Secondary | ICD-10-CM

## 2022-11-19 DIAGNOSIS — G8929 Other chronic pain: Secondary | ICD-10-CM

## 2022-11-19 DIAGNOSIS — R5383 Other fatigue: Secondary | ICD-10-CM

## 2022-11-19 DIAGNOSIS — Z96651 Presence of right artificial knee joint: Secondary | ICD-10-CM

## 2022-11-19 DIAGNOSIS — M7701 Medial epicondylitis, right elbow: Secondary | ICD-10-CM

## 2022-11-19 DIAGNOSIS — I495 Sick sinus syndrome: Secondary | ICD-10-CM

## 2022-11-19 NOTE — Progress Notes (Signed)
Office Visit Note  Patient: Diana Giles             Date of Birth: 1970-12-29           MRN: [id number]             PCP: [name], NP Referring: [name], NP Visit Date: 12/03/2022 Occupation: @GUAROCC @  Subjective:  Generalized pain   History of Present Illness: Diana Giles is a 52 y.o. female with history of osteoarthritis, DDD, and fibromyalgia.  Patient was last seen in the office on 08/11/2022.  At that time she had completed physical therapy and a referral was placed to establish care with pain management.  She was evaluated by Dr. 10/11/2022 on 09/15/22.  She remains on cymbalta 60 mg 2 capsules daily.  She was started on gabapentin which she has been tolerating.  She has started to notice less generalized myalgias and muscle tenderness since initiating gabapentin.  She has had ongoing pain in the right knee replacement as well as pain in both hands.  She denies any obvious joint swelling in her hands or wrist joints currently.  She attributes the discomfort in her hands to working as a 13/8/23.  She has been able to adjust her work schedule and is now working Monday through Thursday.  She plans to renew FMLA paperwork. She continues to experience intermittent bouts of fatigue and brain fog.  She plans on continue to follow-up with pain management.   Activities of Daily Living:  Patient reports morning stiffness for 10 minutes.   Patient Reports nocturnal pain.  Difficulty dressing/grooming: Reports Difficulty climbing stairs: Reports Difficulty getting out of chair: Reports Difficulty using hands for taps, buttons, cutlery, and/or writing: Denies  Review of Systems  Constitutional:  Positive for fatigue.  HENT:  Positive for mouth dryness. Negative for mouth sores.   Eyes:  Negative for dryness.  Respiratory:  Negative for shortness of breath.   Cardiovascular:  Negative for chest pain and palpitations.  Gastrointestinal:  Positive for constipation.  Negative for blood in stool and diarrhea.  Endocrine: Negative for increased urination.  Genitourinary:  Negative for involuntary urination.  Musculoskeletal:  Positive for joint pain, joint pain, joint swelling and morning stiffness. Negative for gait problem, myalgias, muscle weakness, muscle tenderness and myalgias.  Skin:  Negative for color change, rash, hair loss and sensitivity to sunlight.  Allergic/Immunologic: Negative for susceptible to infections.  Neurological:  Negative for dizziness and headaches.  Hematological:  Negative for swollen glands.  Psychiatric/Behavioral:  Negative for depressed mood and sleep disturbance. The patient is not nervous/anxious.     PMFS History:  Patient Active Problem List   Diagnosis Date Noted   COVID-19 11/26/2022   Chronic bilateral low back pain with right-sided sciatica 09/15/2022   Arthritis pain 09/15/2022   Fibromyalgia 09/15/2022   Chronic pain syndrome 09/15/2022   Bilateral sacroiliitis (HCC) 09/15/2022   Routine Papanicolaou smear 08/09/2022   Postmenopause 08/09/2022   Low TSH level 08/05/2022   Snoring 08/05/2022   Boil of buttock 07/26/2022   Pacemaker 01/07/2021   Rheumatoid arthritis (HCC) 11/13/2020   Fatigue 11/13/2020   Class 2 obesity with body mass index (BMI) of 37.0 to 37.9 in adult 11/13/2020   DDD (degenerative disc disease), lumbar 11/13/2020   Avascular necrosis of bone of hip, right (HCC) 11/13/2020   Sinus node dysfunction (HCC) 11/13/2020   Thrombocytosis 11/13/2020   Anxiety 11/13/2020   Attention deficit hyperactivity disorder (ADHD) 11/13/2020  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    Mental disorder    Rheumatoid arthritis (HCC)    Sinus node dysfunction (HCC)    s/p pacemaker   Thrombocytosis     Vaginal Pap smear, abnormal     Family History  Problem Relation Age of Onset   Hypertension Mother    Stroke Mother    Diabetes Father    Heart disease Father    Hyperthyroidism Sister    COPD Sister    Obesity Brother    Hypertension Brother    Anxiety disorder Brother    Obesity Brother    Diabetes Brother    Asthma Brother    Asthma Brother    Polycystic ovary syndrome Daughter    Asthma Son    Obesity Son    Colon cancer Cousin    Past Surgical History:  Procedure Laterality Date   GASTRIC BYPASS  2010   285lbs at highest weight   HYSTEROSCOPY WITH D & C  08/22/2019   OTHER SURGICAL HISTORY  08/2019   Bilateral fallopian tube removal   PACEMAKER PLACEMENT  08/06/2020   REVISION TOTAL KNEE ARTHROPLASTY Right 06/01/2019   SHOULDER ARTHROSCOPY Left 2018   TUBAL LIGATION Bilateral 08/07/2019   Social History   Social History Narrative   Separated for 12 years,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   Caffiene coffee 2 cups.  Cokes 2 daily.   Immunization History  Administered Date(s) Administered   Influenza,inj,Quad PF,6+ Mos 08/05/2022   Influenza-Unspecified 07/31/2020, 07/07/2021   PFIZER(Purple Top)SARS-COV-2 Vaccination 02/09/2020, 03/01/2020, 09/18/2020   Tdap 12/17/2020   Zoster Recombinat (Shingrix) 09/23/2020     Objective: Vital Signs: BP (!) 93/56 (BP Location: Left Arm, Patient Position: Sitting, Cuff Size: Large)   Pulse 81   Resp 12   Ht 5' (1.524 m)   Wt 196 lb (88.9 kg)   BMI 38.28 kg/m    Physical Exam Vitals and nursing note reviewed.  Constitutional:      Appearance: She is well-developed.  HENT:     Head: Normocephalic and atraumatic.  Eyes:     Conjunctiva/sclera: Conjunctivae normal.  Cardiovascular:     Rate and Rhythm: Normal rate and regular rhythm.     Heart sounds: Normal heart sounds.  Pulmonary:     Effort: Pulmonary effort is normal.     Breath sounds: Normal breath sounds.   Abdominal:     General: Bowel sounds are normal.     Palpations: Abdomen is soft.  Musculoskeletal:     Cervical back: Normal range of motion.  Lymphadenopathy:     Cervical: No cervical adenopathy.  Skin:    General: Skin is warm and dry.     Capillary Refill: Capillary refill takes less than 2 seconds.  Neurological:     Mental Status: She is alert and oriented to person, place, and time.  Psychiatric:        Behavior: Behavior normal.      Musculoskeletal Exam: Generalized hyperalgesia and positive tender points on exam.  C-spine has good range of motion.  Trapezius muscle tension and tenderness bilaterally.  Tenderness over the deltoid insertion site bilaterally.  Tenderness over the medial  epicondyles.  Tenderness over the DIPs of bilateral fifth digits.  Complete fist formation bilaterally.  No synovitis over MCP joints or wrist joints noted currently.  Hip joints have good range of motion with no groin pain.  Tenderness over bilateral trochanteric bursa.  Right knee replacement has painful range of motion.  Left knee joint has good range of motion with no warmth or effusion.  Ankle joints have good range of motion with no tenderness or joint swelling.  CDAI Exam: CDAI Score: -- Patient Global: --; Provider Global: -- Swollen: --; Tender: -- Joint Exam 12/03/2022   No joint exam has been documented for this visit   There is currently no information documented on the homunculus. Go to the Rheumatology activity and complete the homunculus joint exam.  Investigation: No additional findings.  Imaging: CT Renal Stone Study  Result Date: 11/10/2022 CLINICAL DATA:  Abdominal and flank pain, stone suspected, left-sided flank pain for 1 week EXAM: CT ABDOMEN AND PELVIS WITHOUT CONTRAST TECHNIQUE: Multidetector CT imaging of the abdomen and pelvis was performed following the standard protocol without IV contrast. RADIATION DOSE REDUCTION: This exam was performed according to the  departmental dose-optimization program which includes automated exposure control, adjustment of the mA and/or kV according to patient size and/or use of iterative reconstruction technique. COMPARISON:  02/03/2022 FINDINGS: Lower chest: No acute abnormality.  Small hiatal hernia. Hepatobiliary: No solid liver abnormality is seen. No gallstones, gallbladder wall thickening, or biliary dilatation. Pancreas: Unremarkable. No pancreatic ductal dilatation or surrounding inflammatory changes. Spleen: Normal in size without significant abnormality. Adrenals/Urinary Tract: Adrenal glands are unremarkable. Kidneys are normal, without renal calculi, solid lesion, or hydronephrosis. Bladder is unremarkable. Stomach/Bowel: Status post Roux type gastric bypass. Appendix appears normal. No evidence of bowel wall thickening, distention, or inflammatory changes. Vascular/Lymphatic: No significant vascular findings are present. No enlarged abdominal or pelvic lymph nodes. Reproductive: No mass or other significant abnormality. Other: No abdominal wall hernia or abnormality. No ascites. Musculoskeletal: No acute or significant osseous findings. 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 IMPRESSION: 1. No acute noncontrast CT findings of the abdomen or pelvis to explain left flank pain. No urinary tract calculi or hydronephrosis. 2. Status post Roux type gastric bypass. 3. Small hiatal hernia. Electronically Signed   By: Jearld Lesch M.D.   On: 11/10/2022 13:05   CT HEAD WO CONTRAST ( )  Result Date: 11/10/2022 CLINICAL DATA:  Headache, head trauma EXAM: CT HEAD WITHOUT CONTRAST TECHNIQUE: Contiguous axial images were obtained from the base of the skull through the vertex without intravenous contrast. RADIATION DOSE REDUCTION: This exam was performed  according to the departmental dose-optimization program which includes automated exposure control, adjustment of the mA and/or kV according to patient size and/or use of iterative reconstruction technique. COMPARISON:  None Available. FINDINGS: Brain: No evidence of acute infarction, hemorrhage, mass, mass effect, or midline shift. No hydrocephalus or extra-axial fluid collection. Vascular: No hyperdense vessel. Skull: Normal. Negative for fracture or focal lesion. Sinuses/Orbits: No acute finding. Other: None. IMPRESSION: No acute intracranial process. Electronically Signed   By: Wiliam Ke M.D.   On: 11/10/2022 12:58    Recent Labs: Lab Results  Component Value Date   WBC 6.8 11/10/2022   HGB 11.8 (L) 11/10/2022   PLT 422 (H) 11/10/2022   NA 139 11/10/2022   K 4.0 11/10/2022   CL 107  11/10/2022   CO2 24 11/10/2022   GLUCOSE 90 11/10/2022   BUN 16 11/10/2022   CREATININE 0.57 11/10/2022   BILITOT 0.5 04/15/2022   ALKPHOS 116 02/03/2022   AST 14 04/15/2022   ALT 8 04/15/2022   PROT 6.9 04/15/2022   ALBUMIN 3.9 02/03/2022   CALCIUM 8.8 (L) 11/10/2022   GFRAA 121 04/09/2021    Speciality Comments: No specialty comments available.  Procedures:  No procedures performed Allergies: Nickel, Penicillins, Shellfish allergy, and Sulfa antibiotics   Assessment / Plan:     Visit Diagnoses: Fibromyalgia - History of generalized pain, positive tender points and hyperalgesia: She has generalized hyperalgesia and positive tender points on examination today.  She continues to have some trapezius muscle tension and tenderness bilaterally.  Tenderness over the medial epicondyle of the right elbow as well as tenderness over bilateral trochanteric bursa noted.  She previously went to physical therapy which alleviated some of her discomfort but she had to discontinue due to complex with work.  After her last office visit she was referred to pain management and establish care with Dr. Tressa Busman.  She  remains on Cymbalta as prescribed.  Gabapentin was added which has been helpful in alleviating some of her generalized myalgias and muscle tenderness.  She continues to have intermittent bouts of fatigue and brain fog.  Discussed the importance of regular exercise and good sleep hygiene.  Strongly encouraged the patient to do light aerobics and stretching on a daily basis.  Good sleep hygiene habits were discussed today in detail.  She plans on continue to follow-up with pain management.  She will follow-up in the office in 4 months or sooner if needed.  Other fatigue: She continues to experience intermittent bouts of fatigue and brain fog.  Discussed the importance of regular exercise and good sleep hygiene.  Discussed good sleep hygiene habits in detail today.  Chronic left shoulder pain - X-rays obtained were unremarkable.  She has good range of motion of the left shoulder joint on examination today.  Some tenderness over the left deltoid insertion site noted.  Medial epicondylitis of elbow, right: She continues to have some tenderness over the medial epicondyle of the right elbow.  Discussed the use of topical agents as well as performing stretching exercises daily.  Primary osteoarthritis of both hands - Clinical and radiographic findings are consistent with osteoarthritis. She was ttd with sulfasalazine in [place] in the past.  She has tenderness over the DIP joint of bilateral fifth digits.  No synovitis over the MCP joints or wrist joints currently.  Trochanteric bursitis of both hips: Tenderness over bilateral trochanteric bursa and along the IT band bilaterally.  Discussed the importance of performing stretching exercises daily.  Avascular necrosis of bone of hip, right [facility] - According the patient it was diagnosed in [place] and no surgery was advised.  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  She continues to have chronic pain in the right knee replacement.  Primary  osteoarthritis of left knee - X-rays showed mild osteoarthritis and mild chondromalacia patella.  Good range of motion of the left knee joint with no warmth or effusion.  Primary osteoarthritis of both feet - Clinical and radiographic findings were consistent with osteoarthritis.  She is not experiencing any increased discomfort in her feet at this time.  DDD (degenerative disc disease), lumbar: Chronic pain and stiffness.  Established care pain management.  Taking gabapentin as prescribed.  Other medical conditions are listed as  follows:  Sinus node dysfunction (HCC)  Thrombocytosis - Work-up by hematologist was negative in the past.  Pacemaker  History of gastric bypass - 2010.  History of ADHD  History of anxiety  Orders: No orders of the defined types were placed in this encounter.  No orders of the defined types were placed in this encounter.    Follow-Up Instructions: Return in about 4 months (around 04/03/2023) for Fibromyalgia, Osteoarthritis, DDD.   Ofilia Neas, PA-C  Note - This record has been created using Dragon software.  Chart creation errors have been sought, but may not always  have been located. Such creation errors do not reflect on  the standard of medical care.

## 2022-11-22 ENCOUNTER — Encounter
Payer: BC Managed Care – PPO | Attending: Physical Medicine and Rehabilitation | Admitting: Physical Medicine and Rehabilitation

## 2022-11-22 DIAGNOSIS — G8929 Other chronic pain: Secondary | ICD-10-CM | POA: Insufficient documentation

## 2022-11-22 DIAGNOSIS — M797 Fibromyalgia: Secondary | ICD-10-CM | POA: Insufficient documentation

## 2022-11-22 DIAGNOSIS — M5441 Lumbago with sciatica, right side: Secondary | ICD-10-CM | POA: Insufficient documentation

## 2022-11-22 DIAGNOSIS — M461 Sacroiliitis, not elsewhere classified: Secondary | ICD-10-CM | POA: Insufficient documentation

## 2022-11-22 DIAGNOSIS — G894 Chronic pain syndrome: Secondary | ICD-10-CM | POA: Insufficient documentation

## 2022-11-24 NOTE — Progress Notes (Signed)
Remote pacemaker transmission.   

## 2022-11-26 ENCOUNTER — Ambulatory Visit: Payer: No Typology Code available for payment source | Admitting: Nurse Practitioner

## 2022-11-26 ENCOUNTER — Encounter: Payer: BC Managed Care – PPO | Admitting: Physical Medicine & Rehabilitation

## 2022-11-26 VITALS — BP 98/66 | HR 83 | Temp 97.8°F | Ht 60.0 in | Wt 197.2 lb

## 2022-11-26 DIAGNOSIS — F419 Anxiety disorder, unspecified: Secondary | ICD-10-CM | POA: Diagnosis not present

## 2022-11-26 DIAGNOSIS — U071 COVID-19: Secondary | ICD-10-CM | POA: Diagnosis not present

## 2022-11-26 MED ORDER — PREDNISONE 20 MG PO TABS: 40.0000 mg | ORAL_TABLET | Freq: Every day | ORAL | 0 refills | Status: DC

## 2022-11-26 MED ORDER — ALBUTEROL SULFATE HFA 108 (90 BASE) MCG/ACT IN AERS: 2.0000 | INHALATION_SPRAY | Freq: Four times a day (QID) | RESPIRATORY_TRACT | 0 refills | Status: DC | PRN

## 2022-11-26 MED ORDER — CLONAZEPAM 1 MG PO TABS: 1.0000 mg | ORAL_TABLET | Freq: Two times a day (BID) | ORAL | 0 refills | Status: DC | PRN

## 2022-11-26 NOTE — Assessment & Plan Note (Signed)
Chronic, continue on duloxetine 60 mg capsule twice a day and Klonopin as needed.  [place] controlled substance database reviewed and refill of Klonopin sent to pharmacy today.

## 2022-11-26 NOTE — Progress Notes (Signed)
Established Patient Office Visit  Subjective   Patient ID: Diana Giles, female    DOB: 1971/02/02  Age: 52 y.o. MRN: [id number]  Chief Complaint  Patient presents with   Anxiety   Estimated Creatinine Clearance: 81 mL/min (by C-G formula based on SCr of 0.57 mg/dL).   Anxiety: Continues on duloxetine BID and klonopin as needed. Requesting refill on klonopin today.  Denies suicidal ideation. Osteoarthritis/Fibromyalgia: Established with rheumatology, requesting handicap placard. Covid 19: Recently diagnosed with COVID-19 infection.  Continues to have some wheezing, however overall is improving.    Review of Systems  Respiratory:  Negative for cough and shortness of breath.   Cardiovascular:  Negative for chest pain.  Genitourinary:  Negative for flank pain.      Objective:     BP 98/66   Pulse 83   Temp 97.8 F (36.6 C) (Oral)   Ht 5' (1.524 m)   Wt 197 lb 4 oz (89.5 kg)   SpO2 95%   BMI 38.52 kg/m  BP Readings from Last 3 Encounters:  11/26/22 98/66  11/10/22 113/72  09/27/22 114/74   Wt Readings from Last 3 Encounters:  11/26/22 197 lb 4 oz (89.5 kg)  11/10/22 192 lb 7.4 oz (87.3 kg)  09/27/22 192 lb 6.4 oz (87.3 kg)      Physical Exam Vitals reviewed.  Constitutional:      General: She is not in acute distress.    Appearance: Normal appearance.  HENT:     Head: Normocephalic and atraumatic.  Neck:     Vascular: No carotid bruit.  Cardiovascular:     Rate and Rhythm: Normal rate and regular rhythm.     Pulses: Normal pulses.     Heart sounds: Normal heart sounds.  Pulmonary:     Effort: Pulmonary effort is normal.     Breath sounds: Wheezing present.  Skin:    General: Skin is warm and dry.  Neurological:     General: No focal deficit present.     Mental Status: She is alert and oriented to person, place, and time.  Psychiatric:        Mood and Affect: Mood normal.        Behavior: Behavior normal.        Judgment: Judgment normal.       No results found for any visits on 11/26/22.    The 10-year ASCVD risk score (Arnett DK, et al., 2019) is: 0.6%    Assessment & Plan:   Problem List Items Addressed This Visit       Other   Anxiety    Chronic, continue on duloxetine 60 mg capsule twice a day and Klonopin as needed.  [place] controlled substance database reviewed and refill of Klonopin sent to pharmacy today.      Relevant Medications   clonazePAM (KLONOPIN) 1 MG tablet   COVID-19 - Primary    Oxygen saturations greater than 90%, however patient does have wheezing on exam today.  No crackles noted.  Will order albuterol inhaler that patient can use as needed for shortness of breath or coughing.  If wheezing worsens or does not improve within the next few days patient will also take short course of prednisone.  Patient reports understanding.      Relevant Medications   albuterol (VENTOLIN HFA) 108 (90 Base) MCG/ACT inhaler   predniSONE (DELTASONE) 20 MG tablet    Return in about 6 months (around 05/27/2023) for f/u with Judson Roch.    [facility]  Carmie Kanner, NP

## 2022-11-26 NOTE — Assessment & Plan Note (Signed)
Oxygen saturations greater than 90%, however patient does have wheezing on exam today.  No crackles noted.  Will order albuterol inhaler that patient can use as needed for shortness of breath or coughing.  If wheezing worsens or does not improve within the next few days patient will also take short course of prednisone.  Patient reports understanding.

## 2022-12-03 ENCOUNTER — Ambulatory Visit: Payer: BC Managed Care – PPO | Attending: Physician Assistant | Admitting: Physician Assistant

## 2022-12-03 ENCOUNTER — Encounter: Payer: Self-pay | Admitting: Physician Assistant

## 2022-12-03 VITALS — BP 93/56 | HR 81 | Resp 12 | Ht 60.0 in | Wt 196.0 lb

## 2022-12-03 DIAGNOSIS — M19041 Primary osteoarthritis, right hand: Secondary | ICD-10-CM

## 2022-12-03 DIAGNOSIS — Z8659 Personal history of other mental and behavioral disorders: Secondary | ICD-10-CM

## 2022-12-03 DIAGNOSIS — I495 Sick sinus syndrome: Secondary | ICD-10-CM

## 2022-12-03 DIAGNOSIS — M7062 Trochanteric bursitis, left hip: Secondary | ICD-10-CM

## 2022-12-03 DIAGNOSIS — M19042 Primary osteoarthritis, left hand: Secondary | ICD-10-CM

## 2022-12-03 DIAGNOSIS — D75839 Thrombocytosis, unspecified: Secondary | ICD-10-CM

## 2022-12-03 DIAGNOSIS — M19072 Primary osteoarthritis, left ankle and foot: Secondary | ICD-10-CM

## 2022-12-03 DIAGNOSIS — Z9884 Bariatric surgery status: Secondary | ICD-10-CM

## 2022-12-03 DIAGNOSIS — M87051 Idiopathic aseptic necrosis of right femur: Secondary | ICD-10-CM

## 2022-12-03 DIAGNOSIS — M797 Fibromyalgia: Secondary | ICD-10-CM

## 2022-12-03 DIAGNOSIS — M19071 Primary osteoarthritis, right ankle and foot: Secondary | ICD-10-CM

## 2022-12-03 DIAGNOSIS — M1712 Unilateral primary osteoarthritis, left knee: Secondary | ICD-10-CM

## 2022-12-03 DIAGNOSIS — M25512 Pain in left shoulder: Secondary | ICD-10-CM

## 2022-12-03 DIAGNOSIS — R5383 Other fatigue: Secondary | ICD-10-CM

## 2022-12-03 DIAGNOSIS — M7701 Medial epicondylitis, right elbow: Secondary | ICD-10-CM | POA: Diagnosis not present

## 2022-12-03 DIAGNOSIS — M7061 Trochanteric bursitis, right hip: Secondary | ICD-10-CM

## 2022-12-03 DIAGNOSIS — M5136 Other intervertebral disc degeneration, lumbar region: Secondary | ICD-10-CM

## 2022-12-03 DIAGNOSIS — G8929 Other chronic pain: Secondary | ICD-10-CM

## 2022-12-03 DIAGNOSIS — Z96651 Presence of right artificial knee joint: Secondary | ICD-10-CM

## 2022-12-03 DIAGNOSIS — Z950 Presence of cardiac pacemaker: Secondary | ICD-10-CM

## 2022-12-18 ENCOUNTER — Other Ambulatory Visit: Payer: Self-pay | Admitting: Nurse Practitioner

## 2022-12-18 DIAGNOSIS — U071 COVID-19: Secondary | ICD-10-CM

## 2022-12-21 ENCOUNTER — Encounter: Payer: Self-pay | Admitting: *Deleted

## 2022-12-28 ENCOUNTER — Other Ambulatory Visit: Payer: Self-pay | Admitting: Nurse Practitioner

## 2022-12-28 DIAGNOSIS — F419 Anxiety disorder, unspecified: Secondary | ICD-10-CM

## 2022-12-30 NOTE — Progress Notes (Signed)
  PROCEDURE RECORD [facility]   Name: Diana Giles DOB:1970-11-13 MRN: [id number]  Date:12/30/2022  Physician: Alysia Penna, MD    Nurse/CMA: Jorja Loa MA  Allergies:  Allergies  Allergen Reactions   Nickel Other (See Comments)   Penicillins     Other Reaction(s): Not available   Shellfish Allergy    Sulfa Antibiotics Rash    Consent Signed: Yes.    Is patient diabetic? No.  CBG today? N/A  Pregnant: No. LMP: No LMP recorded. Patient is postmenopausal. (age 33-55)  Anticoagulants: no Anti-inflammatory: no Antibiotics: no  Procedure: Bilateral Sacroiliac Joint Injections  Position: Prone Start Time: 2:40 PM  End Time: 2:54 PM  Fluoro Time: 29.4  RN/CMA Gwyndolyn Saxon MA    Time 2:24 pm 2;59 pm    BP 99/68 106/78    Pulse 81 63    Respirations 16 16    O2 Sat 95 95    S/S 6 6    Pain Level 7/10 3/10     D/C home with Self, patient A & O X 3, D/C instructions reviewed, and sits independently.

## 2022-12-31 ENCOUNTER — Encounter
Payer: BC Managed Care – PPO | Attending: Physical Medicine and Rehabilitation | Admitting: Physical Medicine & Rehabilitation

## 2022-12-31 ENCOUNTER — Encounter: Payer: Self-pay | Admitting: Physical Medicine & Rehabilitation

## 2022-12-31 VITALS — BP 99/68 | HR 81 | Temp 98.1°F | Ht 60.0 in | Wt 197.0 lb

## 2022-12-31 DIAGNOSIS — M461 Sacroiliitis, not elsewhere classified: Secondary | ICD-10-CM | POA: Diagnosis present

## 2022-12-31 MED ORDER — LIDOCAINE HCL (PF) 2 % IJ SOLN
4.0000 mL | Freq: Once | INTRAMUSCULAR | Status: AC
  Administered 2022-12-31: 4 mL

## 2022-12-31 MED ORDER — IOHEXOL 180 MG/ML  SOLN
3.0000 mL | Freq: Once | INTRAMUSCULAR | Status: AC
  Administered 2022-12-31: 3 mL via INTRA_ARTICULAR

## 2022-12-31 MED ORDER — LIDOCAINE HCL 1 % IJ SOLN
5.0000 mL | Freq: Once | INTRAMUSCULAR | Status: AC
  Administered 2022-12-31: 5 mL

## 2022-12-31 NOTE — Progress Notes (Signed)
Bilateral sacroiliac injections under fluoroscopic guidance  Indication: Low back and buttocks pain not relieved by medication management and other conservative care.  Informed consent was obtained after describing risks and benefits of the procedure with the patient, this includes bleeding, bruising, infection, paralysis and medication side effects. The patient wishes to proceed and has given written consent. The patient was placed in a prone position. The lumbar and sacral area was marked and prepped with Betadine. A 25-gauge 1-1/2 inch needle was inserted into the skin and subcutaneous tissue and 1 mL of 1% lidocaine was injected into each side. Then a 25-gauge 3.5 inch spinal needle was inserted under fluoroscopic guidance into the left sacroiliac joint. AP and lateral images were utilized. Omnipaque 180x0.5 mL under live fluoroscopy demonstrated no intravascular uptake. Then a solution containing 2% lidocaine MPF was injected x1.5 mL. This same procedure was repeated on the right side using the same needle, injectate, and technique. Patient tolerated the procedure well. Post procedure instructions were given. Please see post procedure form.  Meds:  Omnipaque 76m Lidocaine 1% 423mLidocaine 2% PF 2 ml

## 2022-12-31 NOTE — Patient Instructions (Signed)
Sacroiliac injection was performed today. Lidocaine only given history of avascular necrosis The injection was done under x-ray guidance. This procedure has been performed to help reduce low back and buttocks pain as well as potentially hip pain. The duration of this injection is variable  It can be used to better diagnose your pain It may repeated if needed.

## 2023-01-06 ENCOUNTER — Encounter: Payer: Self-pay | Admitting: Radiology

## 2023-01-06 NOTE — Progress Notes (Signed)
Office Visit Note  Patient: Diana Giles             Date of Birth: [birth year]           MRN: [id number]             PCP: Ailene Ards, NP Referring: Ailene Ards, NP Visit Date: 01/20/2023 Occupation: @GUAROCC @  Subjective:  Pain in multiple joints and muscles.  History of Present Illness: Diana Giles is a 51 y.o. female with history of osteoarthritis degenerative disc disease and fibromyalgia syndrome.  She states she continues to have pain and discomfort in her lower back.  She has been going to pain management.  She states she had recent injections by Dr. Tressa Busman which were not helpful.  She continues to go to work despite being in pain.  She has been using a back brace.  She states she continues to have pain and discomfort in her bilateral wrist and bilateral hands which she describes over the [facility] joints.  She continues to have intermittent discomfort in the right elbow.  She has discomfort in her trochanteric bursa and her knee joints.  She continues to have generalized pain and discomfort from fibromyalgia.  She states she has a new job with [employer] at [employer] now.  She is working 32 hours a week.  Patient states that her flares are unpredictable.  She may have a flare once a week.  She would like to have a work excuse for 1 day a week.    Activities of Daily Living:  Patient reports morning stiffness for 10-15 minutes.   Patient Reports nocturnal pain.  Difficulty dressing/grooming: Reports Difficulty climbing stairs: Reports Difficulty getting out of chair: Reports Difficulty using hands for taps, buttons, cutlery, and/or writing: Reports  Review of Systems  Constitutional:  Positive for fatigue.  HENT:  Positive for mouth dryness. Negative for mouth sores.   Eyes:  Negative for dryness.  Respiratory:  Negative for shortness of breath.   Cardiovascular:  Positive for palpitations. Negative for chest pain.  Gastrointestinal:  Positive for constipation.  Negative for blood in stool and diarrhea.  Endocrine: Negative for increased urination.  Genitourinary:  Negative for involuntary urination.  Musculoskeletal:  Positive for joint pain, gait problem, joint pain, joint swelling, myalgias, muscle weakness, morning stiffness, muscle tenderness and myalgias.  Skin:  Positive for hair loss. Negative for color change, rash and sensitivity to sunlight.  Allergic/Immunologic: Negative for susceptible to infections.  Neurological:  Positive for headaches. Negative for dizziness.  Hematological:  Negative for swollen glands.  Psychiatric/Behavioral:  Negative for depressed mood and sleep disturbance. The patient is nervous/anxious.     PMFS History:  Patient Active Problem List   Diagnosis Date Noted   COVID-19 11/26/2022   Chronic bilateral low back pain with right-sided sciatica 09/15/2022   Arthritis pain 09/15/2022   Fibromyalgia 09/15/2022   Chronic pain syndrome 09/15/2022   Bilateral sacroiliitis ([place]) 09/15/2022   Routine Papanicolaou smear 08/09/2022   Postmenopause 08/09/2022   Low TSH level 08/05/2022   Snoring 08/05/2022   Boil of buttock 07/26/2022   Pacemaker 01/07/2021   Rheumatoid arthritis ([place]) 11/13/2020   Fatigue 11/13/2020   Class 2 obesity with body mass index (BMI) of 37.0 to 37.9 in adult 11/13/2020   DDD (degenerative disc disease), lumbar 11/13/2020   Avascular necrosis of bone of hip, right ([place]) 11/13/2020   Sinus node dysfunction ([place]) 11/13/2020   Thrombocytosis 11/13/2020   Anxiety 11/13/2020   Attention  deficit hyperactivity disorder (ADHD) 11/13/2020    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[place])    Bunion of left foot    COVID-19    DDD (degenerative disc disease), lumbar    Fibromyalgia    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    Mental disorder    Rheumatoid arthritis ([place])    Sinus node dysfunction (HCC)    s/p pacemaker   Thrombocytosis     Vaginal Pap smear, abnormal     Family History  Problem Relation Age of Onset   Hypertension Mother    Stroke Mother    Diabetes Father    Heart disease Father    Hyperthyroidism Sister    COPD Sister    Obesity Brother    Hypertension Brother    Anxiety disorder Brother    Obesity Brother    Diabetes Brother    Asthma Brother    Asthma Brother    Polycystic ovary syndrome Daughter    Asthma Son    Obesity Son    Colon cancer Cousin    Past Surgical History:  Procedure Laterality Date   GASTRIC BYPASS  2010   285lbs at highest weight   HYSTEROSCOPY WITH D & C  08/22/2019   OTHER SURGICAL HISTORY  08/2019   Bilateral fallopian tube removal   PACEMAKER PLACEMENT  08/06/2020   REVISION TOTAL KNEE ARTHROPLASTY Right 06/01/2019   SHOULDER ARTHROSCOPY Left 2018   TUBAL LIGATION Bilateral 08/07/2019   Social History   Social History Narrative   Separated for 12 years,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   Work for [employer].   Caffiene coffee 2 cups.  Cokes 2 daily.   Immunization History  Administered Date(s) Administered   Influenza,inj,Quad PF,6+ Mos 08/05/2022   Influenza-Unspecified 07/31/2020, 07/07/2021   PFIZER(Purple Top)SARS-COV-2 Vaccination 02/09/2020, 03/01/2020, 09/18/2020   Tdap 12/17/2020   Zoster Recombinat (Shingrix) 09/23/2020     Objective: Vital Signs: BP 117/76 (BP Location: Left Arm, Patient Position: Sitting, Cuff Size: Large)   Pulse 90   Resp 14   Ht 5' (1.524 m)   Wt 199 lb (90.3 kg)   BMI 38.86 kg/m    Physical Exam Vitals and nursing note reviewed.  Constitutional:      Appearance: She is well-developed.  HENT:     Head: Normocephalic and atraumatic.  Eyes:     Conjunctiva/sclera: Conjunctivae normal.  Cardiovascular:     Rate and Rhythm: Normal rate and regular rhythm.     Heart sounds: Normal heart sounds.  Pulmonary:     Effort: Pulmonary effort is normal.     Breath sounds: Normal breath sounds.   Abdominal:     General: Bowel sounds are normal.     Palpations: Abdomen is soft.  Musculoskeletal:     Cervical back: Normal range of motion.  Lymphadenopathy:     Cervical: No cervical adenopathy.  Skin:    General: Skin is warm and dry.     Capillary Refill: Capillary refill takes less than 2 seconds.  Neurological:     Mental Status: She is alert and oriented to person, place, and time.  Psychiatric:        Behavior: Behavior normal.      Musculoskeletal Exam: Cervical spine was in good range of motion with some discomfort.  She had painful limited range of motion of her lumbar spine.  Shoulder joints, elbow joints,  wrist joints in good range of motion.  She had bilateral PIP DIP and CMC thickening with no synovitis.  She had discomfort range of motion of her right hip joint.  She had tenderness about the trochanteric bursa.  She had discomfort in her knee joints without any warmth swelling or effusion.  There was no tenderness over ankles or MTPs.  CDAI Exam: CDAI Score: -- Patient Global: --; Provider Global: -- Swollen: --; Tender: -- Joint Exam 01/20/2023   No joint exam has been documented for this visit   There is currently no information documented on the homunculus. Go to the Rheumatology activity and complete the homunculus joint exam.  Investigation: No additional findings.  Imaging: No results found.  Recent Labs: Lab Results  Component Value Date   WBC 6.8 11/10/2022   HGB 11.8 (L) 11/10/2022   PLT 422 (H) 11/10/2022   NA 139 11/10/2022   K 4.0 11/10/2022   CL 107 11/10/2022   CO2 24 11/10/2022   GLUCOSE 90 11/10/2022   BUN 16 11/10/2022   CREATININE 0.57 11/10/2022   BILITOT 0.5 04/15/2022   ALKPHOS 116 02/03/2022   AST 14 04/15/2022   ALT 8 04/15/2022   PROT 6.9 04/15/2022   ALBUMIN 3.9 02/03/2022   CALCIUM 8.8 (L) 11/10/2022   GFRAA 121 04/09/2021    Speciality Comments: No specialty comments available.  Procedures:  No procedures  performed Allergies: Nickel, Penicillins, Shellfish allergy, and Sulfa antibiotics   Assessment / Plan:     Visit Diagnoses: Chronic left shoulder pain-she continues to have pain and discomfort in left shoulder.  She had good range of motion.  X-rays in the past were unremarkable.  Medial epicondylitis of elbow, right-she gives history of intermittent pain.  The pain is mostly related to repeated work.  Primary osteoarthritis of both hands -she complains of discomfort in her bilateral hands.  No synovitis was noted.  PIP and DIP thickening and CMC thickening was noted consistent with osteoarthritis.  She has been having increased pain and discomfort in her hands due to doing constant phlebotomies.  Treated with sulfasalazine while living in [place].  Trochanteric bursitis of both hips-she has been having pain and discomfort in her bilateral trochanteric bursa.  IT band stretches were discussed.  Avascular necrosis of bone of hip, right ([place]) - Diagnosed while she was living in [place] per patient.  No surgery was advised per patient.  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  She continues to have chronic pain.  Primary osteoarthritis of left knee -she has pain and discomfort in her knee joint.  No warmth swelling or effusion was noted.  X-rays showed mild osteoarthritis and mild chondromalacia patella.  Primary osteoarthritis of both feet-she has chronic pain in her feet and has difficulty walking.  Proper fitting shoes were advised.  DDD (degenerative disc disease), lumbar-she has severe lower back pain with intermittent severe flares.  She has had injections by Dr. Tressa Busman.  She states recent injections have not been helpful.  She has been missing work due to back pain and flares.  She would like to have FMLA paperwork filled for an excuse about once a week due to flares in her lower back and fibromyalgia.  Will fill FMLA paperwork accordingly.  Fibromyalgia -she has  generalized pain and discomfort from fibromyalgia.  She has been having frequent flares.  She was referred to pain management and has been followed by Dr. Tressa Busman.  She said  that  gabapentin has been helpful but does not alleviating discomfort.  Other fatigue - History of episodic increased fatigue and brain fog.  Other medical problems are listed as follows:  Sinus node dysfunction (HCC)  Pacemaker  History of gastric bypass - 2010  Thrombocytosis - Hematology workup was negative in the past.  History of anxiety  History of ADHD  Orders: No orders of the defined types were placed in this encounter.  No orders of the defined types were placed in this encounter.    Follow-Up Instructions: Return in about 6 months (around 07/23/2023) for Osteoarthritis, FMS.   Bo Merino, MD  Note - This record has been created using Editor, commissioning.  Chart creation errors have been sought, but may not always  have been located. Such creation errors do not reflect on  the standard of medical care.

## 2023-01-10 ENCOUNTER — Other Ambulatory Visit: Payer: Self-pay | Admitting: Physical Medicine and Rehabilitation

## 2023-01-10 ENCOUNTER — Other Ambulatory Visit: Payer: Self-pay | Admitting: Nurse Practitioner

## 2023-01-10 DIAGNOSIS — F419 Anxiety disorder, unspecified: Secondary | ICD-10-CM

## 2023-01-12 MED ORDER — CLONAZEPAM 1 MG PO TABS: 1.0000 mg | ORAL_TABLET | Freq: Two times a day (BID) | ORAL | 0 refills | Status: DC | PRN

## 2023-01-12 MED ORDER — DULOXETINE HCL 60 MG PO CPEP: 60.0000 mg | ORAL_CAPSULE | Freq: Two times a day (BID) | ORAL | 0 refills | Status: DC

## 2023-01-20 ENCOUNTER — Ambulatory Visit: Payer: BLUE CROSS/BLUE SHIELD | Attending: Rheumatology | Admitting: Rheumatology

## 2023-01-20 ENCOUNTER — Encounter: Payer: Self-pay | Admitting: Rheumatology

## 2023-01-20 VITALS — BP 117/76 | HR 90 | Resp 14 | Ht 60.0 in | Wt 199.0 lb

## 2023-01-20 DIAGNOSIS — M87051 Idiopathic aseptic necrosis of right femur: Secondary | ICD-10-CM

## 2023-01-20 DIAGNOSIS — M5136 Other intervertebral disc degeneration, lumbar region: Secondary | ICD-10-CM

## 2023-01-20 DIAGNOSIS — M25512 Pain in left shoulder: Secondary | ICD-10-CM

## 2023-01-20 DIAGNOSIS — M19072 Primary osteoarthritis, left ankle and foot: Secondary | ICD-10-CM

## 2023-01-20 DIAGNOSIS — M51369 Other intervertebral disc degeneration, lumbar region without mention of lumbar back pain or lower extremity pain: Secondary | ICD-10-CM

## 2023-01-20 DIAGNOSIS — M7062 Trochanteric bursitis, left hip: Secondary | ICD-10-CM

## 2023-01-20 DIAGNOSIS — M797 Fibromyalgia: Secondary | ICD-10-CM

## 2023-01-20 DIAGNOSIS — M7701 Medial epicondylitis, right elbow: Secondary | ICD-10-CM | POA: Diagnosis not present

## 2023-01-20 DIAGNOSIS — M7061 Trochanteric bursitis, right hip: Secondary | ICD-10-CM

## 2023-01-20 DIAGNOSIS — G8929 Other chronic pain: Secondary | ICD-10-CM

## 2023-01-20 DIAGNOSIS — M1712 Unilateral primary osteoarthritis, left knee: Secondary | ICD-10-CM

## 2023-01-20 DIAGNOSIS — M19071 Primary osteoarthritis, right ankle and foot: Secondary | ICD-10-CM

## 2023-01-20 DIAGNOSIS — Z9884 Bariatric surgery status: Secondary | ICD-10-CM

## 2023-01-20 DIAGNOSIS — M19042 Primary osteoarthritis, left hand: Secondary | ICD-10-CM

## 2023-01-20 DIAGNOSIS — D75839 Thrombocytosis, unspecified: Secondary | ICD-10-CM

## 2023-01-20 DIAGNOSIS — R5383 Other fatigue: Secondary | ICD-10-CM

## 2023-01-20 DIAGNOSIS — M19041 Primary osteoarthritis, right hand: Secondary | ICD-10-CM | POA: Diagnosis not present

## 2023-01-20 DIAGNOSIS — Z950 Presence of cardiac pacemaker: Secondary | ICD-10-CM

## 2023-01-20 DIAGNOSIS — Z8659 Personal history of other mental and behavioral disorders: Secondary | ICD-10-CM

## 2023-01-20 DIAGNOSIS — I495 Sick sinus syndrome: Secondary | ICD-10-CM

## 2023-01-20 DIAGNOSIS — Z96651 Presence of right artificial knee joint: Secondary | ICD-10-CM

## 2023-01-24 ENCOUNTER — Telehealth: Payer: Self-pay | Admitting: *Deleted

## 2023-01-24 NOTE — Telephone Encounter (Signed)
Left message to advise patient her FMLA paperwork has been completed and faxed. Advised patient her copy is ready for pick up at the front desk.

## 2023-02-01 NOTE — Progress Notes (Deleted)
Subjective:    Patient ID: Diana Giles, female    DOB: [birth year], 52 y.o.   MRN: [id number]  HPI   Pain Inventory Average Pain {NUMBERS; 0-10:5044} Pain Right Now {NUMBERS; 0-10:5044} My pain is {PAIN DESCRIPTION:21022940}  In the last 24 hours, has pain interfered with the following? General activity {NUMBERS; 0-10:5044} Relation with others {NUMBERS; 0-10:5044} Enjoyment of life {NUMBERS; 0-10:5044} What TIME of day is your pain at its worst? {time of day:24191} Sleep (in general) {BHH GOOD/FAIR/POOR:22877}  Pain is worse with: {ACTIVITIES:21022942} Pain improves with: {PAIN IMPROVES SV:[id number] Relief from Meds: {NUMBERS; 0-10:5044}  Family History  Problem Relation Age of Onset   Hypertension Mother    Stroke Mother    Diabetes Father    Heart disease Father    Hyperthyroidism Sister    COPD Sister    Obesity Brother    Hypertension Brother    Anxiety disorder Brother    Obesity Brother    Diabetes Brother    Asthma Brother    Asthma Brother    Polycystic ovary syndrome Daughter    Asthma Son    Obesity Son    Colon cancer Cousin    Social History   Socioeconomic History   Marital status: Legally Separated    Spouse name: Not on file   Number of children: 2   Years of education: Not on file   Highest education level: Not on file  Occupational History   Not on file  Tobacco Use   Smoking status: Never    Passive exposure: Past   Smokeless tobacco: Never  Vaping Use   Vaping Use: Never used  Substance and Sexual Activity   Alcohol use: Yes    Comment: Occasionally   Drug use: Never   Sexual activity: Not Currently    Birth control/protection: Surgical, Post-menopausal    Comment: tubal  Other Topics Concern   Not on file  Social History Narrative   Separated for 12 years,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   Work for [employer].   Caffiene coffee 2 cups.  Cokes 2 daily.   Social Determinants of Health   Financial  Resource Strain: Medium Risk (03/13/2021)   Overall Financial Resource Strain (CARDIA)    Difficulty of Paying Living Expenses: Somewhat hard  Food Insecurity: No Food Insecurity (03/13/2021)   Hunger Vital Sign    Worried About Running Out of Food in the Last Year: Never true    Ran Out of Food in the Last Year: Never true  Transportation Needs: No Transportation Needs (03/13/2021)   PRAPARE - Hydrologist (Medical): No    Lack of Transportation (Non-Medical): No  Physical Activity: Inactive (03/13/2021)   Exercise Vital Sign    Days of Exercise per Week: 0 days    Minutes of Exercise per Session: 30 min  Stress: Stress Concern Present (03/13/2021)   [place]    Feeling of Stress : To some extent  Social Connections: Socially Isolated (03/13/2021)   Social Connection and Isolation Panel [NHANES]    Frequency of Communication with Friends and Family: Three times a week    Frequency of Social Gatherings with Friends and Family: Twice a week    Attends Religious Services: Never    Marine scientist or Organizations: No    Attends Archivist Meetings: Never    Marital Status: Separated   Past Surgical History:  Procedure Laterality Date  GASTRIC BYPASS  2010   285lbs at highest weight   HYSTEROSCOPY WITH D & C  08/22/2019   OTHER SURGICAL HISTORY  08/2019   Bilateral fallopian tube removal   PACEMAKER PLACEMENT  08/06/2020   REVISION TOTAL KNEE ARTHROPLASTY Right 06/01/2019   SHOULDER ARTHROSCOPY Left 2018   TUBAL LIGATION Bilateral 08/07/2019   Past Surgical History:  Procedure Laterality Date   GASTRIC BYPASS  2010   285lbs at highest weight   HYSTEROSCOPY WITH D & C  08/22/2019   OTHER SURGICAL HISTORY  08/2019   Bilateral fallopian tube removal   PACEMAKER PLACEMENT  08/06/2020   REVISION TOTAL KNEE ARTHROPLASTY Right 06/01/2019   SHOULDER ARTHROSCOPY Left 2018   TUBAL  LIGATION Bilateral 08/07/2019   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[place])    Bunion of left foot    COVID-19    DDD (degenerative disc disease), lumbar    Fibromyalgia    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  Opioid Risk Score:   Fall Risk Score:  `1  Depression screen PHQ 2/9     12/31/2022    2:22 PM 11/26/2022    2:46 PM 09/15/2022    1:19 PM 08/05/2022    4:33 PM 02/05/2022   10:51 AM 04/09/2021    8:48 AM 03/13/2021   11:43 AM  Depression screen PHQ 2/9  Decreased Interest 0 0 2 3 2  0 1  Down, Depressed, Hopeless 0 0 1 0 2 0 1  PHQ - 2 Score 0 0 3 3 4  0 2  Altered sleeping  0 3 1 1  0 1  Tired, decreased energy  0 3 0 2 0 1  Change in appetite  0 3 0 1 0 1  Feeling bad or failure about yourself   0 0 0 0 0 1  Trouble concentrating  0 1 0 0 0 1  Moving slowly or fidgety/restless  0 1 0 0 0 0  Suicidal thoughts  0 0 0 0 0 0  PHQ-9 Score  0 14 4 8  0 7  Difficult doing work/chores  Not difficult at all Very difficult Somewhat difficult Somewhat difficult Not difficult at all     Review of Systems     Objective:   Physical Exam        Assessment & Plan:

## 2023-02-02 ENCOUNTER — Encounter: Payer: BLUE CROSS/BLUE SHIELD | Admitting: Physical Medicine and Rehabilitation

## 2023-02-04 ENCOUNTER — Ambulatory Visit (INDEPENDENT_AMBULATORY_CARE_PROVIDER_SITE_OTHER): Payer: No Typology Code available for payment source

## 2023-02-04 DIAGNOSIS — I495 Sick sinus syndrome: Secondary | ICD-10-CM | POA: Diagnosis not present

## 2023-02-04 LAB — CUP PACEART REMOTE DEVICE CHECK
Battery Remaining Longevity: 152 mo
Battery Voltage: 3.03 V
Brady Statistic AP VP Percent: 0.03 %
Brady Statistic AP VS Percent: 5.94 %
Brady Statistic AS VP Percent: 0.03 %
Brady Statistic AS VS Percent: 94 %
Brady Statistic RA Percent Paced: 5.98 %
Brady Statistic RV Percent Paced: 0.06 %
Date Time Interrogation Session: 20240329004759
Implantable Lead Connection Status: 753985
Implantable Lead Connection Status: 753985
Implantable Lead Implant Date: 20210928
Implantable Lead Implant Date: 20210928
Implantable Lead Location: 753859
Implantable Lead Location: 753860
Implantable Lead Model: 4076
Implantable Lead Model: 4076
Implantable Pulse Generator Implant Date: 20210928
Lead Channel Impedance Value: 323 Ohm
Lead Channel Impedance Value: 361 Ohm
Lead Channel Impedance Value: 418 Ohm
Lead Channel Impedance Value: 513 Ohm
Lead Channel Pacing Threshold Amplitude: 0.5 V
Lead Channel Pacing Threshold Amplitude: 1.125 V
Lead Channel Pacing Threshold Pulse Width: 0.4 ms
Lead Channel Pacing Threshold Pulse Width: 0.4 ms
Lead Channel Sensing Intrinsic Amplitude: 1.125 mV
Lead Channel Sensing Intrinsic Amplitude: 1.125 mV
Lead Channel Sensing Intrinsic Amplitude: 6.875 mV
Lead Channel Sensing Intrinsic Amplitude: 6.875 mV
Lead Channel Setting Pacing Amplitude: 2 V
Lead Channel Setting Pacing Amplitude: 2.25 V
Lead Channel Setting Pacing Pulse Width: 0.4 ms
Lead Channel Setting Sensing Sensitivity: 0.9 mV
Zone Setting Status: 755011

## 2023-02-16 ENCOUNTER — Other Ambulatory Visit: Payer: Self-pay | Admitting: Nurse Practitioner

## 2023-02-16 DIAGNOSIS — F419 Anxiety disorder, unspecified: Secondary | ICD-10-CM

## 2023-02-17 MED ORDER — CLONAZEPAM 1 MG PO TABS: 1.0000 mg | ORAL_TABLET | Freq: Two times a day (BID) | ORAL | 0 refills | Status: DC | PRN

## 2023-02-23 ENCOUNTER — Encounter
Payer: BLUE CROSS/BLUE SHIELD | Attending: Physical Medicine and Rehabilitation | Admitting: Physical Medicine and Rehabilitation

## 2023-02-23 VITALS — BP 110/74 | HR 81 | Ht 60.0 in | Wt 203.0 lb

## 2023-02-23 DIAGNOSIS — G8929 Other chronic pain: Secondary | ICD-10-CM | POA: Insufficient documentation

## 2023-02-23 DIAGNOSIS — M461 Sacroiliitis, not elsewhere classified: Secondary | ICD-10-CM | POA: Diagnosis present

## 2023-02-23 DIAGNOSIS — G894 Chronic pain syndrome: Secondary | ICD-10-CM | POA: Diagnosis present

## 2023-02-23 DIAGNOSIS — M797 Fibromyalgia: Secondary | ICD-10-CM

## 2023-02-23 DIAGNOSIS — M5441 Lumbago with sciatica, right side: Secondary | ICD-10-CM | POA: Insufficient documentation

## 2023-02-23 NOTE — Patient Instructions (Signed)
Start Aquatherapy  Obtain MRI lumbar spine  Once labs are done, I will message you about increasing gabapentin  Follow up in 3 months

## 2023-02-23 NOTE — Progress Notes (Signed)
Subjective:    Patient ID: Diana Giles, female    DOB: [birth year], 52 y.o.   MRN: [id number]  HPI   Diana Giles is a 52 y.o. year old female  who  has a past medical history of Anxiety, Avascular necrosis of hip (HCC), Bunion of left foot, COVID-19, DDD (degenerative disc disease), lumbar, Fibromyalgia, Herpes simplex virus (HSV) infection, HPV in female, Mental disorder, Rheumatoid arthritis (HCC), Sinus node dysfunction (HCC), Thrombocytosis, and Vaginal Pap smear, abnormal.   They are presenting to PM&R clinic for follow up related to chronic pain from OA, FM, and DDD pain. Exam consistent with multifactorial pain with differential including facet arthropathy, R L5-S1 radiculopathy, bilateral sacroiliitis, and myofascial pain  .  Plan from last visit: onic bilateral low back pain with right-sided sciatica Assessment & Plan: Reviwed MRI 2022 with patient; ? R S1 radiculopathy on exam, however imaging without significant neuroforaminal or spinal stenosis.   Hx and prior 3 months relief from ablation more consistent with facet arthropathy as primary pain generator.   Discussed possible repeat MBB in the future with Dr. Wynn Banker; defer at this time.         Fibromyalgia Assessment & Plan: Discussed continuing Duloxetine 60 mg BID vs. Weaning due to ? Benefit; will continue for now.   Prior was on only 1-2x daily gabapentin, never tried Lyrica. After reviewing 04/2022 labs for Crcl, will restart gabapentin 300 mg TID and have patient call in 1-2 weeks to report effect. Can increase dose vs. Switch to lyrica if needed.   Continue HEP; cannot accommodate aquatherapy with current job     Chronic pain syndrome Assessment & Plan: No pain contract or narcotics prescribed today d/t lack of perceived benefit in the past on Tramadol. Trialling non-narcotic medications and interventions for now. Can re-address in the future if needed.   I will have you follow up in 2-3 months to  discuss pain control and next steps.    Please obtain records from your old pain provider in [place] and send them to our office prior to that visit.      Bilateral sacroiliitis [facility]) Assessment & Plan: Will schedule bilateral SI joint injections with Dr. Wynn Banker. Specified lidocaine ONLY due to Hx AVN of hip.      Other orders -     Gabapentin; Take 1 capsule (300 mg total) by mouth 3 (three) times daily.  Dispense: 90 capsule; Refill: 2   Interval Hx:  - States her right lower extremity sensory loss started after a knee replacement 2020.    - Follow ups: Bilateral SI joint injections 2/23. "When I walked out of there I felt great", but by the end of that night it was "like it never happened".  Saw rheumatology recently   - Medications: Looked up that inconsistent use of gabapentin can contribute to palpations, and has noticed that they have decreased significantly since she has taken it more consistently. Only really occurs if she goes too long between doses.   "Before, the pain seemed to radiate. Now, it seems more localized" Some worsening when it is raining or with travel, or increased activity.    - Other concerns: Notes increased pain in the left top of her foot; last time she had fractures. Was last when she was on her feet for a long time during covid and "my feet turned purple"; was told she had osteopenia in both feet.   Has reduced her working hours to four days per week.  MRI 2022: IMPRESSION: Mild degenerative changes in the lumbar spine, with mild left neural foraminal narrowing at L4-L5 and no spinal canal stenosis.    Pain Inventory Average Pain 7 Pain Right Now 7 My pain is constant and aching  In the last 24 hours, has pain interfered with the following? General activity 6 Relation with others 5 Enjoyment of life 6 What TIME of day is your pain at its worst? morning  and night Sleep (in general) Fair  Pain is worse with: walking, bending, sitting,  inactivity, standing, and some activites Pain improves with: heat/ice Relief from Meds: 6  Family History  Problem Relation Age of Onset   Hypertension Mother    Stroke Mother    Diabetes Father    Heart disease Father    Hyperthyroidism Sister    COPD Sister    Obesity Brother    Hypertension Brother    Anxiety disorder Brother    Obesity Brother    Diabetes Brother    Asthma Brother    Asthma Brother    Polycystic ovary syndrome Daughter    Asthma Son    Obesity Son    Colon cancer Cousin    Social History   Socioeconomic History   Marital status: Legally Separated    Spouse name: Not on file   Number of children: 2   Years of education: Not on file   Highest education level: Not on file  Occupational History   Not on file  Tobacco Use   Smoking status: Never    Passive exposure: Past   Smokeless tobacco: Never  Vaping Use   Vaping Use: Never used  Substance and Sexual Activity   Alcohol use: Yes    Comment: Occasionally   Drug use: Never   Sexual activity: Not Currently    Birth control/protection: Surgical, Post-menopausal    Comment: tubal  Other Topics Concern   Not on file  Social History Narrative   Separated for 12 years,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   Caffiene coffee 2 cups.  Cokes 2 daily.   Social Determinants of Health   Financial Resource Strain: Medium Risk (03/13/2021)   Overall Financial Resource Strain (CARDIA)    Difficulty of Paying Living Expenses: Somewhat hard  Food Insecurity: No Food Insecurity (03/13/2021)   Hunger Vital Sign    Worried About Running Out of Food in the Last Year: Never true    Ran Out of Food in the Last Year: Never true  Transportation Needs: No Transportation Needs (03/13/2021)   PRAPARE - Administrator, Civil Service (Medical): No    Lack of Transportation (Non-Medical): No  Physical Activity: Inactive (03/13/2021)   Exercise Vital Sign    Days of Exercise per  Week: 0 days    Minutes of Exercise per Session: 30 min  Stress: Stress Concern Present (03/13/2021)   [employer] of Occupational Health - Occupational Stress Questionnaire    Feeling of Stress : To some extent  Social Connections: Socially Isolated (03/13/2021)   Social Connection and Isolation Panel [NHANES]    Frequency of Communication with Friends and Family: Three times a week    Frequency of Social Gatherings with Friends and Family: Twice a week    Attends Religious Services: Never    Database administrator or Organizations: No    Attends Banker Meetings: Never    Marital Status: Separated   Past Surgical History:  Procedure  Laterality Date   GASTRIC BYPASS  2010   285lbs at highest weight   HYSTEROSCOPY WITH D & C  08/22/2019   OTHER SURGICAL HISTORY  08/2019   Bilateral fallopian tube removal   PACEMAKER PLACEMENT  08/06/2020   REVISION TOTAL KNEE ARTHROPLASTY Right 06/01/2019   SHOULDER ARTHROSCOPY Left 2018   TUBAL LIGATION Bilateral 08/07/2019   Past Surgical History:  Procedure Laterality Date   GASTRIC BYPASS  2010   285lbs at highest weight   HYSTEROSCOPY WITH D & C  08/22/2019   OTHER SURGICAL HISTORY  08/2019   Bilateral fallopian tube removal   PACEMAKER PLACEMENT  08/06/2020   REVISION TOTAL KNEE ARTHROPLASTY Right 06/01/2019   SHOULDER ARTHROSCOPY Left 2018   TUBAL LIGATION Bilateral 08/07/2019   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  Opioid Risk Score:   Fall Risk Score:  `1  Depression screen PHQ 2/9     12/31/2022    2:22 PM  11/26/2022    2:46 PM 09/15/2022    1:19 PM 08/05/2022    4:33 PM 02/05/2022   10:51 AM 04/09/2021    8:48 AM 03/13/2021   11:43 AM  Depression screen PHQ 2/9  Decreased Interest 0 0 2 3 2  0 1  Down, Depressed, Hopeless 0 0 1 0 2 0 1  PHQ - 2 Score 0 0 3 3 4  0 2  Altered sleeping  0 3 1 1  0 1  Tired, decreased energy  0 3 0 2 0 1  Change in appetite  0 3 0 1 0 1  Feeling bad or failure about yourself   0 0 0 0 0 1  Trouble concentrating  0 1 0 0 0 1  Moving slowly or fidgety/restless  0 1 0 0 0 0  Suicidal thoughts  0 0 0 0 0 0  PHQ-9 Score  0 14 4 8  0 7  Difficult doing work/chores  Not difficult at all Very difficult Somewhat difficult Somewhat difficult Not difficult at all       Review of Systems  Musculoskeletal:  Positive for back pain.       RT wrist pain B/L hip, leg, foot pain LT knee pain  All other systems reviewed and are negative.      Objective:   Physical Exam   PE: Constitution: Appropriate appearance for age. No apparent distress  +Obese Resp: No respiratory distress. No accessory muscle usage. on RA Cardio: Well perfused appearance. No peripheral edema. Abdomen: Nondistended. Nontender.   Psych: Appropriate mood and affect. Neuro: AAOx4. No apparent cognitive deficits   Neurologic Exam:   DTRs: Reflexes were 2+ in bilateral achilles, patella, biceps, BR and triceps. Babinsky: flexor responses b/l.   Hoffmans: negative b/l Sensory exam: +Decresaed sensation in R lateral knee, extending along lateral calf and into plantar foot.  Motor exam: strength 5/5 throughout bilateral upper extremities and bilateral lower extremities Coordination: Fine motor coordination was normal.  Back MSK:  + Bilateral facet loading, + TTP paraspinals + Low back pain with b/l FABER    Assessment & Plan:   Diana Giles is a 52 y.o. year old female  who  has a past medical history of Anxiety, Avascular necrosis of hip (HCC), Bunion of left foot, COVID-19, DDD (degenerative  disc disease), lumbar, Fibromyalgia, Herpes simplex virus (HSV) infection, HPV in female, Mental disorder, Rheumatoid arthritis (HCC), Sinus node dysfunction (HCC), Thrombocytosis, and Vaginal Pap smear, abnormal.   They are presenting to PM&R clinic as follow up for chronic pain from OA, FM, and DDD pain.   Chronic bilateral low back pain with right-sided sciatica -     MR LUMBAR SPINE WO CONTRAST; Future -     Ambulatory referral to Physical Therapy -     Basic metabolic panel Obtain MRI lumbar spine  Once labs are done, I will message you about increasing gabapentin  Follow up in 3 months  Fibromyalgia -     MR LUMBAR SPINE WO CONTRAST; Future -     Ambulatory referral to Physical Therapy -     Basic metabolic panel Start aquatherapy for gentle mobilization and   Chronic pain syndrome -     MR LUMBAR SPINE WO CONTRAST; Future -     Ambulatory referral to Physical Therapy No pain contract or narcotics prescribed today d/t lack of perceived benefit in the past on Tramadol. Trialling non-narcotic medications and interventions for now. Can re-address in the future if needed.  Bilateral sacroiliitis -     MR LUMBAR SPINE WO CONTRAST; Future -     Ambulatory referral to Physical Therapy -     Basic metabolic panel Had SI joint injection with Dr. Wynn Banker, without benefit

## 2023-03-01 ENCOUNTER — Other Ambulatory Visit: Payer: Self-pay | Admitting: Adult Health

## 2023-03-01 ENCOUNTER — Other Ambulatory Visit: Payer: Self-pay | Admitting: Nurse Practitioner

## 2023-03-01 DIAGNOSIS — U071 COVID-19: Secondary | ICD-10-CM

## 2023-03-04 ENCOUNTER — Ambulatory Visit: Payer: BLUE CROSS/BLUE SHIELD | Attending: Physical Medicine and Rehabilitation | Admitting: Physical Therapy

## 2023-03-04 ENCOUNTER — Other Ambulatory Visit: Payer: Self-pay

## 2023-03-04 ENCOUNTER — Encounter: Payer: Self-pay | Admitting: Physical Therapy

## 2023-03-04 DIAGNOSIS — M5459 Other low back pain: Secondary | ICD-10-CM | POA: Insufficient documentation

## 2023-03-04 DIAGNOSIS — M25551 Pain in right hip: Secondary | ICD-10-CM | POA: Insufficient documentation

## 2023-03-04 DIAGNOSIS — G894 Chronic pain syndrome: Secondary | ICD-10-CM | POA: Insufficient documentation

## 2023-03-04 DIAGNOSIS — M797 Fibromyalgia: Secondary | ICD-10-CM | POA: Diagnosis not present

## 2023-03-04 DIAGNOSIS — G8929 Other chronic pain: Secondary | ICD-10-CM | POA: Diagnosis present

## 2023-03-04 DIAGNOSIS — M5431 Sciatica, right side: Secondary | ICD-10-CM | POA: Insufficient documentation

## 2023-03-04 DIAGNOSIS — M25561 Pain in right knee: Secondary | ICD-10-CM | POA: Diagnosis present

## 2023-03-04 DIAGNOSIS — M461 Sacroiliitis, not elsewhere classified: Secondary | ICD-10-CM | POA: Insufficient documentation

## 2023-03-04 DIAGNOSIS — M5441 Lumbago with sciatica, right side: Secondary | ICD-10-CM | POA: Diagnosis not present

## 2023-03-04 NOTE — Therapy (Signed)
OUTPATIENT PHYSICAL THERAPY THORACOLUMBAR EVALUATION   Patient Name: Diana Giles MRN: [id number] DOB:Jan 27, 1971, 52 y.o., female Today's Date: 03/04/2023  END OF SESSION:  PT End of Session - 03/04/23 1024     Visit Number 1    Date for PT Re-Evaluation 04/29/23    Authorization Type [place]    Authorization Time Period 03/04/23 to 04/29/23    PT Start Time 0930    PT Stop Time 1022    PT Time Calculation (min) 52 min    Activity Tolerance Patient tolerated treatment well    Behavior During Therapy WFL for tasks assessed/performed             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    Mental disorder    Rheumatoid arthritis (HCC)    Sinus node dysfunction (HCC)    s/p pacemaker   Thrombocytosis    Vaginal Pap smear, abnormal    Past Surgical History:  Procedure Laterality Date   GASTRIC BYPASS  2010   285lbs at highest weight   HYSTEROSCOPY WITH D & C  08/22/2019   OTHER SURGICAL HISTORY  08/2019   Bilateral fallopian tube removal   PACEMAKER PLACEMENT  08/06/2020   REVISION TOTAL KNEE ARTHROPLASTY Right 06/01/2019   SHOULDER ARTHROSCOPY Left 2018   TUBAL LIGATION Bilateral 08/07/2019   Patient Active Problem List   Diagnosis Date Noted   COVID-19 11/26/2022   Chronic bilateral low back pain with right-sided sciatica 09/15/2022   Arthritis pain 09/15/2022   Fibromyalgia 09/15/2022   Chronic pain syndrome 09/15/2022   Bilateral sacroiliitis (HCC) 09/15/2022   Routine Papanicolaou smear 08/09/2022   Postmenopause 08/09/2022   Low TSH level 08/05/2022   Snoring 08/05/2022   Boil of buttock 07/26/2022   Pacemaker 01/07/2021   Rheumatoid arthritis (HCC) 11/13/2020   Fatigue 11/13/2020   Class 2 obesity with body mass index (BMI) of 37.0 to 37.9 in adult  11/13/2020   DDD (degenerative disc disease), lumbar 11/13/2020   Avascular necrosis of bone of hip, right (HCC) 11/13/2020   Sinus node dysfunction (HCC) 11/13/2020   Thrombocytosis 11/13/2020   Anxiety 11/13/2020   Attention deficit hyperactivity disorder (ADHD) 11/13/2020    PCP: [name], NP    REFERRING PROVIDER: Elijah Birk, DO  REFERRING DIAG: Chronic bilateral LBP with Rt sided sciatica; fibromyalgia; chronic pain syndrome; bilateral sacroiliitis  Rationale for Evaluation and Treatment: Rehabilitation  THERAPY DIAG:  Other low back pain  Sciatica, right side  Pain in right hip  Chronic pain of right knee  ONSET DATE:  SUBJECTIVE:  SUBJECTIVE STATEMENT: Pt states that she has been dealing with pain for atleast 20 years, but in the past 5 years its gotten worse with falls, injuries, etc. She was started on Gabapentin and feels this has helped her better pinpoint her pain. Pain is sacrum into the Rt buttock and lateral thigh down around the knee. She has numbness in the lateral foot. She has had PT several times in the past. She is interested in aquatic PT because it's the only thing she hasn't done to help the pain.  Pain is often so bad that she can't get out of bed.   PERTINENT HISTORY:  Rt hip avascular necrosis of hip Anxiety  DDD Fibromyalgia  Rheumatoid Arthritis  Lt shoulder arthroplasty Rt total knee replacement and revision 2020 Pace maker   PAIN:  Are you having pain? Yes: NPRS scale: 8/10 Pain location: sacrum and Rt buttock, Rt LE/lateral knee Pain description: constant  Aggravating factors: sitting, supine, prolonged activity/walking  Relieving factors: avoiding prolonged activity   PRECAUTIONS: None  WEIGHT BEARING RESTRICTIONS: No  FALLS:  Has patient  fallen in last 6 months? No  LIVING ENVIRONMENT: Lives with: lives with their family Lives in: House/apartment Stairs: No Has following equipment at home: None  OCCUPATION: Phlebotomist   PLOF: Independent  PATIENT GOALS: decrease pain  NEXT MD VISIT: f/u on MRI soon  OBJECTIVE:   DIAGNOSTIC FINDINGS:  MRI ordered   PATIENT SURVEYS:  Need next visit  SCREENING FOR RED FLAGS: Bowel or bladder incontinence: No Spinal tumors: No Cauda equina syndrome: No Compression fracture: No Abdominal aneurysm: No  COGNITION: Overall cognitive status: Within functional limits for tasks assessed     SENSATION: Pt notes numbness   MUSCLE LENGTH: Hamstrings: Right WNL deg; Left WNL deg Maisie Fus test: Right (+) Rt for quad and hip flexor  POSTURE: No Significant postural limitations  PALPATION: Tenderness Rt lateral gastroc/peroneals, Rt lateral quad/ITB, Rt glutes  LUMBAR ROM:   AROM eval  Flexion   Extension   Right lateral flexion   Left lateral flexion   Right rotation   Left rotation    (Blank rows = not tested)  LOWER EXTREMITY ROM:     Active  Right eval Left eval  Hip flexion    Hip extension    Hip abduction    Hip adduction    Hip internal rotation    Hip external rotation    Knee flexion    Knee extension    Ankle dorsiflexion    Ankle plantarflexion    Ankle inversion    Ankle eversion     (Blank rows = not tested)  LOWER EXTREMITY MMT:    MMT Right eval Left eval  Hip flexion 4 4  Hip extension 3 4  Hip abduction 3 4  Hip adduction    Hip internal rotation    Hip external rotation    Knee flexion 5 5  Knee extension 4 5  Ankle dorsiflexion    Ankle plantarflexion Heel raise unable Heel raise x6  Ankle inversion    Ankle eversion     (Blank rows = not tested)  LUMBAR SPECIAL TESTS:  Next visit  FUNCTIONAL TESTS:  5 times sit to stand: 18 sec, weight shifted Lt  Timed up and go (TUG): 11 sec  GAIT: Distance walked: 1ft   Assistive device utilized: None Level of assistance: Complete Independence Comments: decreased stance on Rt, decreased step length on Lt, decreased push off on Rt   TODAY'S TREATMENT:  DATE:  03/04/23 Supine Rt SLR x5  Single leg heel raise each side Supine hip flexor stretch Rt side x20 sec   Standing bilateral heel raises x10 reps HEP pain education in regards to limiting HEP to pain free reps to build back tolerance to general activity/exercise.     PATIENT EDUCATION:  Education details: eval findings/POC; implemented HEP; benefits of aquatic PT; chronic pain response Person educated: Patient Education method: Explanation and Handouts Education comprehension: verbalized understanding and returned demonstration  HOME EXERCISE PROGRAM: Access Code: [email or web address] URL: [email or web address] Date: 03/04/2023 Prepared by: [facility]  Exercises - Modified Maisie Fus Stretch  - 2 x daily - 7 x weekly - 2 sets - 20 seconds hold - Heel Raises with Counter Support with Brace On  - 3 x daily - 7 x weekly - 10 reps  ASSESSMENT:  CLINICAL IMPRESSION: Patient is a 52 y.o. F who was seen today for physical therapy evaluation and treatment for chronic low back pain, with Rt LE pain. She has significant weakness of the Rt knee and walks with antalgic pattern. Increased time to complete sit to stand with notable weight shift onto the Lt. Pt has history of Rt hip avascular necrosis, DDD, fibromyalgia and Rt total knee replacement with revision. Her pain and weakness has a negative impact on her daily activity at home and she will sometimes have to miss work secondary to difficulty getting out of bed because of high levels of pain. Pt would benefit from skilled PT to address her limitations in LE flexibility, strength and pain  management education to facilitate increase in participation at home/work activity.   OBJECTIVE IMPAIRMENTS: Abnormal gait, decreased activity tolerance, decreased balance, decreased endurance, decreased strength, impaired flexibility, and pain.   ACTIVITY LIMITATIONS: lifting, bending, sitting, standing, squatting, stairs, locomotion level, and caring for others  PARTICIPATION LIMITATIONS: cleaning, laundry, interpersonal relationship, shopping, community activity, occupation, and yard work  PERSONAL FACTORS: Age, Fitness, Past/current experiences, Time since onset of injury/illness/exacerbation, and 3+ comorbidities: Rt hip avascular necrosis, fibromyalgia, Rt TKA with revision  are also affecting patient's functional outcome.   REHAB POTENTIAL: Good  CLINICAL DECISION MAKING: Evolving/moderate complexity  EVALUATION COMPLEXITY: Moderate   GOALS: Goals reviewed with patient? Yes  SHORT TERM GOALS: Target date: 03/11/23  Pt will be independent with her initial HEP to improve LE strength and flexibility. Baseline: Goal status: INITIAL   LONG TERM GOALS: Target date: 04/29/23  Pt will report atleast 40% improvement in her activity participation from the start of PT. Baseline:  Goal status: INITIAL  2.  Pt will have improved hip strength to atleast 4/5 MMT which will increase efficiency with daily activity. Baseline:  Goal status: INITIAL  3.  Pt will have improved Rt hip flexibility evident by negative thomas test.  Baseline:  Goal status: INITIAL  4.  Pt will have improved gastroc strength bilaterally, able to complete atleast 15 consecutive single leg heel raises. Baseline:  Goal status: INITIAL  5.  Pt will be independent with an advanced HEP to increase  Baseline:  Goal status: INITIAL   PLAN:  PT FREQUENCY: 2x/week  PT DURATION: 8 weeks  PLANNED INTERVENTIONS: Therapeutic exercises, Therapeutic activity, Neuromuscular re-education, Balance training, Gait  training, Patient/Family education, Self Care, Joint mobilization, Aquatic Therapy, Dry Needling, Moist heat, Taping, Manual therapy, and Re-evaluation.  PLAN FOR NEXT SESSION: possible DN to back/glutes/quads/gastroc; progress hip strength and add to HEP as able  2:34 PM,03/04/23 Donita Brooks PT,  DPT [facility] [place]  [phone]

## 2023-03-07 ENCOUNTER — Encounter: Payer: Self-pay | Admitting: Physical Therapy

## 2023-03-07 ENCOUNTER — Ambulatory Visit: Payer: BLUE CROSS/BLUE SHIELD | Admitting: Physical Therapy

## 2023-03-07 DIAGNOSIS — G8929 Other chronic pain: Secondary | ICD-10-CM

## 2023-03-07 DIAGNOSIS — M25551 Pain in right hip: Secondary | ICD-10-CM

## 2023-03-07 DIAGNOSIS — M5431 Sciatica, right side: Secondary | ICD-10-CM

## 2023-03-07 DIAGNOSIS — M5459 Other low back pain: Secondary | ICD-10-CM

## 2023-03-07 NOTE — Therapy (Addendum)
OUTPATIENT PHYSICAL THERAPY THORACOLUMBAR EVALUATION   Patient Name: Diana Giles MRN: [id number] DOB:1971/10/19, 52 y.o., female Today's Date: 03/07/2023  END OF SESSION:  PT End of Session - 03/07/23 1156     Visit Number 2    Date for PT Re-Evaluation 04/29/23    Authorization Type [employer]    Authorization Time Period 03/04/23 to 04/29/23    PT Start Time 1147    PT Stop Time 1225    PT Time Calculation (min) 38 min    Activity Tolerance Patient tolerated treatment well    Behavior During Therapy WFL for tasks assessed/performed              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    Mental disorder    Rheumatoid arthritis (HCC)    Sinus node dysfunction (HCC)    s/p pacemaker   Thrombocytosis    Vaginal Pap smear, abnormal    Past Surgical History:  Procedure Laterality Date   GASTRIC BYPASS  2010   285lbs at highest weight   HYSTEROSCOPY WITH D & C  08/22/2019   OTHER SURGICAL HISTORY  08/2019   Bilateral fallopian tube removal   PACEMAKER PLACEMENT  08/06/2020   REVISION TOTAL KNEE ARTHROPLASTY Right 06/01/2019   SHOULDER ARTHROSCOPY Left 2018   TUBAL LIGATION Bilateral 08/07/2019   Patient Active Problem List   Diagnosis Date Noted   COVID-19 11/26/2022   Chronic bilateral low back pain with right-sided sciatica 09/15/2022   Arthritis pain 09/15/2022   Fibromyalgia 09/15/2022   Chronic pain syndrome 09/15/2022   Bilateral sacroiliitis (HCC) 09/15/2022   Routine Papanicolaou smear 08/09/2022   Postmenopause 08/09/2022   Low TSH level 08/05/2022   Snoring 08/05/2022   Boil of buttock 07/26/2022   Pacemaker 01/07/2021   Rheumatoid arthritis (HCC) 11/13/2020   Fatigue 11/13/2020   Class 2 obesity with body mass index (BMI) of 37.0 to 37.9 in adult  11/13/2020   DDD (degenerative disc disease), lumbar 11/13/2020   Avascular necrosis of bone of hip, right (HCC) 11/13/2020   Sinus node dysfunction (HCC) 11/13/2020   Thrombocytosis 11/13/2020   Anxiety 11/13/2020   Attention deficit hyperactivity disorder (ADHD) 11/13/2020    PCP: [name], NP    REFERRING PROVIDER: Elijah Birk, DO  REFERRING DIAG: Chronic bilateral LBP with Rt sided sciatica; fibromyalgia; chronic pain syndrome; bilateral sacroiliitis  Rationale for Evaluation and Treatment: Rehabilitation  THERAPY DIAG:  Other low back pain  Pain in right hip  Sciatica, right side  Chronic pain of right knee  ONSET DATE:  SUBJECTIVE:  SUBJECTIVE STATEMENT: This is a far drive for me to get to PT when I work. I am on FMLA which is helpful this round. I really hope to correct this issue this time.   EVAL: Pt states that she has been dealing with pain for atleast 20 years, but in the past 5 years its gotten worse with falls, injuries, etc. She was started on Gabapentin and feels this has helped her better pinpoint her pain. Pain is sacrum into the Rt buttock and lateral thigh down around the knee. She has numbness in the lateral foot. She has had PT several times in the past. She is interested in aquatic PT because it's the only thing she hasn't done to help the pain.  Pain is often so bad that she can't get out of bed.   PERTINENT HISTORY:  Rt hip avascular necrosis of hip Anxiety  DDD Fibromyalgia  Rheumatoid Arthritis  Lt shoulder arthroplasty Rt total knee replacement and revision 2020 Pace maker   PAIN:  Are you having pain? Yes: NPRS scale: 8/10 Pain location: sacrum and Rt buttock, Rt LE/lateral knee Pain description: constant  Aggravating factors: sitting, supine, prolonged  activity/walking  Relieving factors: avoiding prolonged activity   PRECAUTIONS: None  WEIGHT BEARING RESTRICTIONS: No  FALLS:  Has patient fallen in last 6 months? No  LIVING ENVIRONMENT: Lives with: lives with their family Lives in: House/apartment Stairs: No Has following equipment at home: None  OCCUPATION: Phlebotomist   PLOF: Independent  PATIENT GOALS: decrease pain  NEXT MD VISIT: f/u on MRI soon  OBJECTIVE:   DIAGNOSTIC FINDINGS:  MRI ordered   PATIENT SURVEYS:  Need next visit  SCREENING FOR RED FLAGS: Bowel or bladder incontinence: No Spinal tumors: No Cauda equina syndrome: No Compression fracture: No Abdominal aneurysm: No  COGNITION: Overall cognitive status: Within functional limits for tasks assessed     SENSATION: Pt notes numbness   MUSCLE LENGTH: Hamstrings: Right WNL deg; Left WNL deg Maisie Fus test: Right (+) Rt for quad and hip flexor  POSTURE: No Significant postural limitations  PALPATION: Tenderness Rt lateral gastroc/peroneals, Rt lateral quad/ITB, Rt glutes  LUMBAR ROM:   AROM eval  Flexion   Extension   Right lateral flexion   Left lateral flexion   Right rotation   Left rotation    (Blank rows = not tested)  LOWER EXTREMITY ROM:     Active  Right eval Left eval  Hip flexion    Hip extension    Hip abduction    Hip adduction    Hip internal rotation    Hip external rotation    Knee flexion    Knee extension    Ankle dorsiflexion    Ankle plantarflexion    Ankle inversion    Ankle eversion     (Blank rows = not tested)  LOWER EXTREMITY MMT:    MMT Right eval Left eval  Hip flexion 4 4  Hip extension 3 4  Hip abduction 3 4  Hip adduction    Hip internal rotation    Hip external rotation    Knee flexion 5 5  Knee extension 4 5  Ankle dorsiflexion    Ankle plantarflexion Heel raise unable Heel raise x6  Ankle inversion    Ankle eversion     (Blank rows = not tested)  LUMBAR SPECIAL TESTS:  Next  visit  FUNCTIONAL TESTS:  5 times sit to stand: 18 sec, weight shifted Lt  Timed up and go (TUG): 11  sec  GAIT: Distance walked: 69ft  Assistive device utilized: None Level of assistance: Complete Independence Comments: decreased stance on Rt, decreased step length on Lt, decreased push off on Rt   TODAY'S TREATMENT:                                                                                                                              DATE:  03/07/23 Nustep lvl 5, 5 min with PT present for subjective  Supine Rt SLR x5  Figure 4 stretch 2x30 sec on R LE. LTR x30  Trigger Point Dry-Needling  Treatment instructions: Expect mild to moderate muscle soreness. S/S of pneumothorax if dry needled over a lung field, and to seek immediate medical attention should they occur. Patient verbalized understanding of these instructions and education.  Patient Consent Given: Yes Education handout provided: No Muscles treated: R Piriformis, R L3/L4 multifidi.  Electrical stimulation performed: No Parameters: N/A Treatment response/outcome: Pt reports less noted tenderness to the area following. STM to R piriformis following dry needling.   pain education in regards to limiting HEP to pain free reps to build back tolerance to general activity/exercise.  03/04/23 Supine Rt SLR x5  Single leg heel raise each side Supine hip flexor stretch Rt side x20 sec   Standing bilateral heel raises x10 reps HEP pain education in regards to limiting HEP to pain free reps to build back tolerance to general activity/exercise.     PATIENT EDUCATION:  Education details: eval findings/POC; implemented HEP; benefits of aquatic PT; chronic pain response Person educated: Patient Education method: Explanation and Handouts Education comprehension: verbalized understanding and returned demonstration  HOME EXERCISE PROGRAM: Access Code: [email or web address] URL: [email or web address] Date: 03/04/2023 Prepared  by: [facility] - Outpatient Rehab - [facility]  Exercises - Modified Maisie Fus Stretch  - 2 x daily - 7 x weekly - 2 sets - 20 seconds hold - Heel Raises with Counter Support with Brace On  - 3 x daily - 7 x weekly - 10 reps  ASSESSMENT:  CLINICAL IMPRESSION: Patient presents to first f/u appt with continued pain in her R posterior hip and anterior knee. She reports compliance with her HEP. Discussed possible transfer to [facility] due to distance between work and PT. Due to previous discussion, pt opted for TPDN today with good results. She had no adverse effects and reports less noted tenderness over R piriformis following. Session with focus on lower trunk mobility and piriformis mobility. She tolerated all prescribed exercises well. Encouraged increased water intake and heat at needed for any adverse side effects from TPDN today. Pt would benefit from skilled PT to address her limitations in LE flexibility, strength and pain management education to facilitate increase in participation at home/work activity.   OBJECTIVE IMPAIRMENTS: Abnormal gait, decreased activity tolerance, decreased balance, decreased endurance, decreased strength, impaired flexibility, and pain.   ACTIVITY LIMITATIONS: lifting, bending, sitting, standing, squatting, stairs, locomotion level, and caring for others  PARTICIPATION  LIMITATIONS: cleaning, laundry, interpersonal relationship, shopping, community activity, occupation, and yard work  PERSONAL FACTORS: Age, Fitness, Past/current experiences, Time since onset of injury/illness/exacerbation, and 3+ comorbidities: Rt hip avascular necrosis, fibromyalgia, Rt TKA with revision  are also affecting patient's functional outcome.   REHAB POTENTIAL: Good  CLINICAL DECISION MAKING: Evolving/moderate complexity  EVALUATION COMPLEXITY: Moderate   GOALS: Goals reviewed with patient? Yes  SHORT TERM GOALS: Target date: 03/11/23  Pt will be independent with her  initial HEP to improve LE strength and flexibility. Baseline: Goal status: INITIAL   LONG TERM GOALS: Target date: 04/29/23  Pt will report atleast 40% improvement in her activity participation from the start of PT. Baseline:  Goal status: INITIAL  2.  Pt will have improved hip strength to atleast 4/5 MMT which will increase efficiency with daily activity. Baseline:  Goal status: INITIAL  3.  Pt will have improved Rt hip flexibility evident by negative thomas test.  Baseline:  Goal status: INITIAL  4.  Pt will have improved gastroc strength bilaterally, able to complete atleast 15 consecutive single leg heel raises. Baseline:  Goal status: INITIAL  5.  Pt will be independent with an advanced HEP to increase  Baseline:  Goal status: INITIAL   PLAN:  PT FREQUENCY: 2x/week  PT DURATION: 8 weeks  PLANNED INTERVENTIONS: Therapeutic exercises, Therapeutic activity, Neuromuscular re-education, Balance training, Gait training, Patient/Family education, Self Care, Joint mobilization, Aquatic Therapy, Dry Needling, Moist heat, Taping, Manual therapy, and Re-evaluation.  PLAN FOR NEXT SESSION: possible DN to back/glutes/quads/gastroc; progress hip strength and add to HEP as able  Royal Hawthorn PT, DPT 03/07/23  12:49 PM

## 2023-03-09 NOTE — Progress Notes (Signed)
Remote pacemaker transmission.   

## 2023-03-10 NOTE — Therapy (Signed)
OUTPATIENT PHYSICAL THERAPY THORACOLUMBAR EVALUATION   Patient Name: Diana Giles MRN: [id number] DOB:[birth year], 52 y.o., female Today's Date: 03/11/2023  END OF SESSION:  PT End of Session - 03/11/23 1050     Visit Number 3    Date for PT Re-Evaluation 04/29/23    Authorization Type [employer]    Authorization Time Period 03/04/23 to 04/29/23    PT Start Time 1018    PT Stop Time 1102    PT Time Calculation (min) 44 min    Activity Tolerance Patient tolerated treatment well;Patient limited by pain    Behavior During Therapy WFL for tasks assessed/performed               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    Mental disorder    Rheumatoid arthritis (HCC)    Sinus node dysfunction (HCC)    s/p pacemaker   Thrombocytosis    Vaginal Pap smear, abnormal    Past Surgical History:  Procedure Laterality Date   GASTRIC BYPASS  2010   285lbs at highest weight   HYSTEROSCOPY WITH D & C  08/22/2019   OTHER SURGICAL HISTORY  08/2019   Bilateral fallopian tube removal   PACEMAKER PLACEMENT  08/06/2020   REVISION TOTAL KNEE ARTHROPLASTY Right 06/01/2019   SHOULDER ARTHROSCOPY Left 2018   TUBAL LIGATION Bilateral 08/07/2019   Patient Active Problem List   Diagnosis Date Noted   COVID-19 11/26/2022   Chronic bilateral low back pain with right-sided sciatica 09/15/2022   Arthritis pain 09/15/2022   Fibromyalgia 09/15/2022   Chronic pain syndrome 09/15/2022   Bilateral sacroiliitis (HCC) 09/15/2022   Routine Papanicolaou smear 08/09/2022   Postmenopause 08/09/2022   Low TSH level 08/05/2022   Snoring 08/05/2022   Boil of buttock 07/26/2022   Pacemaker 01/07/2021   Rheumatoid arthritis (HCC) 11/13/2020   Fatigue 11/13/2020   Class 2 obesity with body mass index (BMI) of  37.0 to 37.9 in adult 11/13/2020   DDD (degenerative disc disease), lumbar 11/13/2020   Avascular necrosis of bone of hip, right (HCC) 11/13/2020   Sinus node dysfunction (HCC) 11/13/2020   Thrombocytosis 11/13/2020   Anxiety 11/13/2020   Attention deficit hyperactivity disorder (ADHD) 11/13/2020    PCP: [name], NP    REFERRING PROVIDER: Elijah Birk, DO  REFERRING DIAG: Chronic bilateral LBP with Rt sided sciatica; fibromyalgia; chronic pain syndrome; bilateral sacroiliitis  Rationale for Evaluation and Treatment: Rehabilitation  THERAPY DIAG:  Other low back pain  Pain in right hip  Sciatica, right side  Chronic pain of right knee  ONSET DATE:  SUBJECTIVE:  SUBJECTIVE STATEMENT: Pt states that the DN helped last session. She continues to note pain, but feels as though the knot has gotten smaller.   EVAL: Pt states that she has been dealing with pain for atleast 20 years, but in the past 5 years its gotten worse with falls, injuries, etc. She was started on Gabapentin and feels this has helped her better pinpoint her pain. Pain is sacrum into the Rt buttock and lateral thigh down around the knee. She has numbness in the lateral foot. She has had PT several times in the past. She is interested in aquatic PT because it's the only thing she hasn't done to help the pain.  Pain is often so bad that she can't get out of bed.   PERTINENT HISTORY:  Rt hip avascular necrosis of hip Anxiety  DDD Fibromyalgia  Rheumatoid Arthritis  Lt shoulder arthroplasty Rt total knee replacement and revision 2020 Pace maker   PAIN:  Are you having pain? Yes: NPRS scale: 8/10 Pain location: sacrum and Rt buttock, Rt LE/lateral knee Pain description: constant  Aggravating factors: sitting, supine, prolonged  activity/walking  Relieving factors: avoiding prolonged activity   PRECAUTIONS: None  WEIGHT BEARING RESTRICTIONS: No  FALLS:  Has patient fallen in last 6 months? No  LIVING ENVIRONMENT: Lives with: lives with their family Lives in: House/apartment Stairs: No Has following equipment at home: None  OCCUPATION: Phlebotomist   PLOF: Independent  PATIENT GOALS: decrease pain  NEXT MD VISIT: f/u on MRI soon  OBJECTIVE:   DIAGNOSTIC FINDINGS:  MRI ordered   PATIENT SURVEYS:  Need next visit  SCREENING FOR RED FLAGS: Bowel or bladder incontinence: No Spinal tumors: No Cauda equina syndrome: No Compression fracture: No Abdominal aneurysm: No  COGNITION: Overall cognitive status: Within functional limits for tasks assessed     SENSATION: Pt notes numbness   MUSCLE LENGTH: Hamstrings: Right WNL deg; Left WNL deg Maisie Fus test: Right (+) Rt for quad and hip flexor  POSTURE: No Significant postural limitations  PALPATION: Tenderness Rt lateral gastroc/peroneals, Rt lateral quad/ITB, Rt glutes  LUMBAR ROM:   AROM eval  Flexion   Extension   Right lateral flexion   Left lateral flexion   Right rotation   Left rotation    (Blank rows = not tested)  LOWER EXTREMITY ROM:     Active  Right eval Left eval  Hip flexion    Hip extension    Hip abduction    Hip adduction    Hip internal rotation    Hip external rotation    Knee flexion    Knee extension    Ankle dorsiflexion    Ankle plantarflexion    Ankle inversion    Ankle eversion     (Blank rows = not tested)  LOWER EXTREMITY MMT:    MMT Right eval Left eval  Hip flexion 4 4  Hip extension 3 4  Hip abduction 3 4  Hip adduction    Hip internal rotation    Hip external rotation    Knee flexion 5 5  Knee extension 4 5  Ankle dorsiflexion    Ankle plantarflexion Heel raise unable Heel raise x6  Ankle inversion    Ankle eversion     (Blank rows = not tested)  LUMBAR SPECIAL TESTS:  Next  visit  FUNCTIONAL TESTS:  5 times sit to stand: 18 sec, weight shifted Lt  Timed up and go (TUG): 11 sec  GAIT: Distance walked: 32ft  Assistive device utilized:  None Level of assistance: Complete Independence Comments: decreased stance on Rt, decreased step length on Lt, decreased push off on Rt   TODAY'S TREATMENT:   Date: 03/11/2023 Nustep lvl 5, 5 min with PT present for subjective  LTR x30  5 min moist heat to help alleviate pain prior to exercises.  PPT x20, with moist heat due to reports of pain without heat.  SKTC 2x30 sec, bilat with moist heat due to reports of pain without heat.  Abdominal bracing with 5 sec hold, x10 Hip IR x 20, bilat  Figure 4 stretch 2x30 sec on R LE. Supine x 20 without resistance                                                                                                                              DATE: 03/07/23 Nustep lvl 5, 5 min with PT present for subjective  Supine Rt SLR x5  Figure 4 stretch 2x30 sec on R LE. LTR x30  Trigger Point Dry-Needling  Treatment instructions: Expect mild to moderate muscle soreness. S/S of pneumothorax if dry needled over a lung field, and to seek immediate medical attention should they occur. Patient verbalized understanding of these instructions and education. Patient Consent Given: Yes Education handout provided: No Muscles treated: R Piriformis, R L3/L4 multifidi.  Electrical stimulation performed: No Parameters: N/A Treatment response/outcome: Pt reports less noted tenderness to the area following. STM to R piriformis following dry needling.   pain education in regards to limiting HEP to pain free reps to build back tolerance to general activity/exercise.  03/04/23 Supine Rt SLR x5  Single leg heel raise each side Supine hip flexor stretch Rt side x20 sec   Standing bilateral heel raises x10 reps HEP pain education in regards to limiting HEP to pain free reps to build back tolerance to general  activity/exercise.  PATIENT EDUCATION:  Education details: eval findings/POC; implemented HEP; benefits of aquatic PT; chronic pain response Person educated: Patient Education method: Explanation and Handouts Education comprehension: verbalized understanding and returned demonstration  HOME EXERCISE PROGRAM: Access Code: [email or web address] URL: [email or web address] Date: 03/04/2023 Prepared by: [facility] - Outpatient Rehab - [facility]  Exercises - Modified Maisie Fus Stretch  - 2 x daily - 7 x weekly - 2 sets - 20 seconds hold - Heel Raises with Counter Support with Brace On  - 3 x daily - 7 x weekly - 10 reps  ASSESSMENT:  CLINICAL IMPRESSION: Patient presents to PT with continued symptoms, but feels as though her knot has reduced since DN. She reports compliance with her HEP.  Session with focus on lumbar mobility today to tolerance. Pt required moist heat to help alleviate tension prior to continuing on with session. She tolerated all prescribed exercises with moist heat on her lower back. She requires cues for proper form. She will continue to benefit from lumbar mobility and core strengthening to help with pain modulation prior to LE strengthening. Pt would  benefit from skilled PT to address her limitations in LE flexibility, strength and pain management education to facilitate increase in participation at home/work activity.   OBJECTIVE IMPAIRMENTS: Abnormal gait, decreased activity tolerance, decreased balance, decreased endurance, decreased strength, impaired flexibility, and pain.   ACTIVITY LIMITATIONS: lifting, bending, sitting, standing, squatting, stairs, locomotion level, and caring for others  PARTICIPATION LIMITATIONS: cleaning, laundry, interpersonal relationship, shopping, community activity, occupation, and yard work  PERSONAL FACTORS: Age, Fitness, Past/current experiences, Time since onset of injury/illness/exacerbation, and 3+ comorbidities: Rt hip  avascular necrosis, fibromyalgia, Rt TKA with revision  are also affecting patient's functional outcome.   REHAB POTENTIAL: Good  CLINICAL DECISION MAKING: Evolving/moderate complexity  EVALUATION COMPLEXITY: Moderate   GOALS: Goals reviewed with patient? Yes  SHORT TERM GOALS: Target date: 03/11/23  Pt will be independent with her initial HEP to improve LE strength and flexibility. Baseline: Goal status: INITIAL   LONG TERM GOALS: Target date: 04/29/23  Pt will report atleast 40% improvement in her activity participation from the start of PT. Baseline:  Goal status: INITIAL  2.  Pt will have improved hip strength to atleast 4/5 MMT which will increase efficiency with daily activity. Baseline:  Goal status: INITIAL  3.  Pt will have improved Rt hip flexibility evident by negative thomas test.  Baseline:  Goal status: INITIAL  4.  Pt will have improved gastroc strength bilaterally, able to complete atleast 15 consecutive single leg heel raises. Baseline:  Goal status: INITIAL  5.  Pt will be independent with an advanced HEP to increase  Baseline:  Goal status: INITIAL   PLAN:  PT FREQUENCY: 2x/week  PT DURATION: 8 weeks  PLANNED INTERVENTIONS: Therapeutic exercises, Therapeutic activity, Neuromuscular re-education, Balance training, Gait training, Patient/Family education, Self Care, Joint mobilization, Aquatic Therapy, Dry Needling, Moist heat, Taping, Manual therapy, and Re-evaluation.  PLAN FOR NEXT SESSION: possible DN to back/glutes/quads/gastroc; progress hip strength and add to HEP as able  Royal Hawthorn PT, DPT 03/11/23  10:55 AM

## 2023-03-11 ENCOUNTER — Encounter: Payer: Self-pay | Admitting: Physical Therapy

## 2023-03-11 ENCOUNTER — Ambulatory Visit: Payer: No Typology Code available for payment source | Admitting: Nurse Practitioner

## 2023-03-11 ENCOUNTER — Ambulatory Visit: Payer: PRIVATE HEALTH INSURANCE | Attending: Physical Medicine and Rehabilitation | Admitting: Physical Therapy

## 2023-03-11 ENCOUNTER — Encounter: Payer: No Typology Code available for payment source | Admitting: Physical Therapy

## 2023-03-11 DIAGNOSIS — G8929 Other chronic pain: Secondary | ICD-10-CM | POA: Insufficient documentation

## 2023-03-11 DIAGNOSIS — M25551 Pain in right hip: Secondary | ICD-10-CM | POA: Insufficient documentation

## 2023-03-11 DIAGNOSIS — M5431 Sciatica, right side: Secondary | ICD-10-CM | POA: Diagnosis present

## 2023-03-11 DIAGNOSIS — M25561 Pain in right knee: Secondary | ICD-10-CM | POA: Insufficient documentation

## 2023-03-11 DIAGNOSIS — M5459 Other low back pain: Secondary | ICD-10-CM | POA: Insufficient documentation

## 2023-03-14 ENCOUNTER — Encounter: Payer: No Typology Code available for payment source | Admitting: Physical Therapy

## 2023-03-15 ENCOUNTER — Ambulatory Visit: Payer: PRIVATE HEALTH INSURANCE | Attending: Nurse Practitioner

## 2023-03-15 DIAGNOSIS — G8929 Other chronic pain: Secondary | ICD-10-CM | POA: Insufficient documentation

## 2023-03-15 DIAGNOSIS — M25551 Pain in right hip: Secondary | ICD-10-CM

## 2023-03-15 DIAGNOSIS — M25561 Pain in right knee: Secondary | ICD-10-CM | POA: Diagnosis present

## 2023-03-15 DIAGNOSIS — M5431 Sciatica, right side: Secondary | ICD-10-CM

## 2023-03-15 DIAGNOSIS — M5459 Other low back pain: Secondary | ICD-10-CM | POA: Insufficient documentation

## 2023-03-15 NOTE — Therapy (Signed)
OUTPATIENT PHYSICAL THERAPY THORACOLUMBAR TREATMENT   Patient Name: Diana Giles MRN: [id number] DOB:12/20/1970, 52 y.o., female Today's Date: 03/15/2023  END OF SESSION:  PT End of Session - 03/15/23 1601     Visit Number 4    Date for PT Re-Evaluation 04/29/23    Authorization Type [employer]    Authorization Time Period 03/04/23 to 04/29/23    PT Start Time 1600    PT Stop Time 1645    PT Time Calculation (min) 45 min    Activity Tolerance Patient tolerated treatment well;Patient limited by pain    Behavior During Therapy WFL for tasks assessed/performed               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    Mental disorder    Rheumatoid arthritis (HCC)    Sinus node dysfunction (HCC)    s/p pacemaker   Thrombocytosis    Vaginal Pap smear, abnormal    Past Surgical History:  Procedure Laterality Date   GASTRIC BYPASS  2010   285lbs at highest weight   HYSTEROSCOPY WITH D & C  08/22/2019   OTHER SURGICAL HISTORY  08/2019   Bilateral fallopian tube removal   PACEMAKER PLACEMENT  08/06/2020   REVISION TOTAL KNEE ARTHROPLASTY Right 06/01/2019   SHOULDER ARTHROSCOPY Left 2018   TUBAL LIGATION Bilateral 08/07/2019   Patient Active Problem List   Diagnosis Date Noted   COVID-19 11/26/2022   Chronic bilateral low back pain with right-sided sciatica 09/15/2022   Arthritis pain 09/15/2022   Fibromyalgia 09/15/2022   Chronic pain syndrome 09/15/2022   Bilateral sacroiliitis (HCC) 09/15/2022   Routine Papanicolaou smear 08/09/2022   Postmenopause 08/09/2022   Low TSH level 08/05/2022   Snoring 08/05/2022   Boil of buttock 07/26/2022   Pacemaker 01/07/2021   Rheumatoid arthritis (HCC) 11/13/2020   Fatigue 11/13/2020   Class 2 obesity with body mass index (BMI) of  37.0 to 37.9 in adult 11/13/2020   DDD (degenerative disc disease), lumbar 11/13/2020   Avascular necrosis of bone of hip, right (HCC) 11/13/2020   Sinus node dysfunction (HCC) 11/13/2020   Thrombocytosis 11/13/2020   Anxiety 11/13/2020   Attention deficit hyperactivity disorder (ADHD) 11/13/2020    PCP: [name], NP    REFERRING PROVIDER: Elijah Birk, DO  REFERRING DIAG: Chronic bilateral LBP with Rt sided sciatica; fibromyalgia; chronic pain syndrome; bilateral sacroiliitis  Rationale for Evaluation and Treatment: Rehabilitation  THERAPY DIAG:  Other low back pain  Pain in right hip  Sciatica, right side  Chronic pain of right knee  ONSET DATE:  SUBJECTIVE:  SUBJECTIVE STATEMENT:  Pt reports 8/0 pain upon arrival to the clinic.  Pt notes that she experiences pain pretty much all day every day.  EVAL: Pt states that she has been dealing with pain for atleast 20 years, but in the past 5 years its gotten worse with falls, injuries, etc. She was started on Gabapentin and feels this has helped her better pinpoint her pain. Pain is sacrum into the Rt buttock and lateral thigh down around the knee. She has numbness in the lateral foot. She has had PT several times in the past. She is interested in aquatic PT because it's the only thing she hasn't done to help the pain.  Pain is often so bad that she can't get out of bed.   PERTINENT HISTORY:  Rt hip avascular necrosis of hip Anxiety  DDD Fibromyalgia  Rheumatoid Arthritis  Lt shoulder arthroplasty Rt total knee replacement and revision 2020 Pace maker   PAIN:  Are you having pain? Yes: NPRS scale: 8/10 Pain location: sacrum and Rt buttock, Rt LE/lateral knee Pain description: constant  Aggravating factors: sitting, supine, prolonged  activity/walking  Relieving factors: avoiding prolonged activity   PRECAUTIONS: None  WEIGHT BEARING RESTRICTIONS: No  FALLS:  Has patient fallen in last 6 months? No  LIVING ENVIRONMENT: Lives with: lives with their family Lives in: House/apartment Stairs: No Has following equipment at home: None  OCCUPATION: Phlebotomist   PLOF: Independent  PATIENT GOALS: decrease pain  NEXT MD VISIT: f/u on MRI soon  OBJECTIVE:   DIAGNOSTIC FINDINGS:  MRI ordered   PATIENT SURVEYS:  Need next visit  SCREENING FOR RED FLAGS: Bowel or bladder incontinence: No Spinal tumors: No Cauda equina syndrome: No Compression fracture: No Abdominal aneurysm: No  COGNITION: Overall cognitive status: Within functional limits for tasks assessed     SENSATION: Pt notes numbness   MUSCLE LENGTH: Hamstrings: Right WNL deg; Left WNL deg Maisie Fus test: Right (+) Rt for quad and hip flexor  POSTURE: No Significant postural limitations  PALPATION: Tenderness Rt lateral gastroc/peroneals, Rt lateral quad/ITB, Rt glutes  LUMBAR ROM:   AROM eval  Flexion   Extension   Right lateral flexion   Left lateral flexion   Right rotation   Left rotation    (Blank rows = not tested)  LOWER EXTREMITY ROM:     Active  Right eval Left eval  Hip flexion    Hip extension    Hip abduction    Hip adduction    Hip internal rotation    Hip external rotation    Knee flexion    Knee extension    Ankle dorsiflexion    Ankle plantarflexion    Ankle inversion    Ankle eversion     (Blank rows = not tested)  LOWER EXTREMITY MMT:    MMT Right eval Left eval  Hip flexion 4 4  Hip extension 3 4  Hip abduction 3 4  Hip adduction    Hip internal rotation    Hip external rotation    Knee flexion 5 5  Knee extension 4 5  Ankle dorsiflexion    Ankle plantarflexion Heel raise unable Heel raise x6  Ankle inversion    Ankle eversion     (Blank rows = not tested)  LUMBAR SPECIAL TESTS:  Next  visit  FUNCTIONAL TESTS:  5 times sit to stand: 18 sec, weight shifted Lt  Timed up and go (TUG): 11 sec  GAIT: Distance walked: 65ft  Assistive device utilized:  None Level of assistance: Complete Independence Comments: decreased stance on Rt, decreased step length on Lt, decreased push off on Rt   TODAY'S TREATMENT:   Date: 03/15/23  Nustep lvl 5, 5 min with PT present for subjective  LTR x30  Hooklying bridging with glute squeeze prior to lift off, 2x10 PPT x20, with moist heat due to reports of pain without heat.  SKTC 2x30 sec, bilat with moist heat due to reports of pain without heat.  Abdominal bracing with 5 sec hold, x10 Hip IR x 20, bilat  Figure 4 stretch 2x30 sec on R LE.  Trigger Point Dry Needling (TDN), unbilled, 6 minute Education performed with patient regarding potential benefit of TDN. Reviewed precautions and risks with patient. Reviewed special precautions/risks over lung fields which include pneumothorax. Reviewed signs and symptoms of pneumothorax and advised pt to go to ER immediately if these symptoms develop advise them of dry needling treatment. Extensive time spent with pt to ensure full understanding of TDN risks. Pt provided verbal consent to treatment. TDN performed to R glute, bilateral L3/L4 multifidus with 0.30 x 100 single needle placements with local twitch response (LTR). Pistoning technique utilized. Improved pain-free motion following intervention.     03/11/2023 Nustep lvl 5, 5 min with PT present for subjective  LTR x30  5 min moist heat to help alleviate pain prior to exercises.  PPT x20, with moist heat due to reports of pain without heat.  SKTC 2x30 sec, bilat with moist heat due to reports of pain without heat.  Abdominal bracing with 5 sec hold, x10 Hip IR x 20, bilat  Figure 4 stretch 2x30 sec on R LE. Supine x 20 without resistance                                                                                                                               DATE: 03/07/23 Nustep lvl 5, 5 min with PT present for subjective  Supine Rt SLR x5  Figure 4 stretch 2x30 sec on R LE. LTR x30  Trigger Point Dry-Needling  Treatment instructions: Expect mild to moderate muscle soreness. S/S of pneumothorax if dry needled over a lung field, and to seek immediate medical attention should they occur. Patient verbalized understanding of these instructions and education. Patient Consent Given: Yes Education handout provided: No Muscles treated: R Piriformis, R L3/L4 multifidi.  Electrical stimulation performed: No Parameters: N/A Treatment response/outcome: Pt reports less noted tenderness to the area following. STM to R piriformis following dry needling.   pain education in regards to limiting HEP to pain free reps to build back tolerance to general activity/exercise.  03/04/23 Supine Rt SLR x5  Single leg heel raise each side Supine hip flexor stretch Rt side x20 sec   Standing bilateral heel raises x10 reps HEP pain education in regards to limiting HEP to pain free reps to build back tolerance to general activity/exercise.  PATIENT EDUCATION:  Education details: eval  findings/POC; implemented HEP; benefits of aquatic PT; chronic pain response Person educated: Patient Education method: Explanation and Handouts Education comprehension: verbalized understanding and returned demonstration  HOME EXERCISE PROGRAM: Access Code: [email or web address] URL: [email or web address] Date: 03/04/2023 Prepared by: [facility] - [facility] Rehab - [facility]  Exercises - Modified Maisie Fus Stretch  - 2 x daily - 7 x weekly - 2 sets - 20 seconds hold - Heel Raises with Counter Support with Brace On  - 3 x daily - 7 x weekly - 10 reps  ASSESSMENT:  CLINICAL IMPRESSION:  Pt responded well to the exercises and is doing well with the mobility of her spine.  Pt noted relief from dry needling and reduced tension in the lumbar  spine.  Pt does demonstrate increased mobility in the spine at the conclusion of session and  and will continue to benefit from needling and aquatic therapy in future sessions.  Pt will increase resistance with core stability over the next several sessions as well.   Pt will continue to benefit from skilled therapy to address remaining deficits in order to improve overall QoL and return to PLOF.       OBJECTIVE IMPAIRMENTS: Abnormal gait, decreased activity tolerance, decreased balance, decreased endurance, decreased strength, impaired flexibility, and pain.   ACTIVITY LIMITATIONS: lifting, bending, sitting, standing, squatting, stairs, locomotion level, and caring for others  PARTICIPATION LIMITATIONS: cleaning, laundry, interpersonal relationship, shopping, community activity, occupation, and yard work  PERSONAL FACTORS: Age, Fitness, Past/current experiences, Time since onset of injury/illness/exacerbation, and 3+ comorbidities: Rt hip avascular necrosis, fibromyalgia, Rt TKA with revision  are also affecting patient's functional outcome.   REHAB POTENTIAL: Good  CLINICAL DECISION MAKING: Evolving/moderate complexity  EVALUATION COMPLEXITY: Moderate   GOALS: Goals reviewed with patient? Yes  SHORT TERM GOALS: Target date: 03/11/23  Pt will be independent with her initial HEP to improve LE strength and flexibility. Baseline: Goal status: INITIAL   LONG TERM GOALS: Target date: 04/29/23  Pt will report atleast 40% improvement in her activity participation from the start of PT. Baseline:  Goal status: INITIAL  2.  Pt will have improved hip strength to atleast 4/5 MMT which will increase efficiency with daily activity. Baseline:  Goal status: INITIAL  3.  Pt will have improved Rt hip flexibility evident by negative thomas test.  Baseline:  Goal status: INITIAL  4.  Pt will have improved gastroc strength bilaterally, able to complete atleast 15 consecutive single leg heel  raises. Baseline:  Goal status: INITIAL  5.  Pt will be independent with an advanced HEP to increase  Baseline:  Goal status: INITIAL   PLAN:  PT FREQUENCY: 2x/week  PT DURATION: 8 weeks  PLANNED INTERVENTIONS: Therapeutic exercises, Therapeutic activity, Neuromuscular re-education, Balance training, Gait training, Patient/Family education, Self Care, Joint mobilization, Aquatic Therapy, Dry Needling, Moist heat, Taping, Manual therapy, and Re-evaluation.  PLAN FOR NEXT SESSION: possible DN to back/glutes/quads/gastroc; progress hip strength and add to HEP as able   Nolon Bussing, PT, DPT Physical Therapist - [facility]  03/15/23, 5:45 PM

## 2023-03-18 ENCOUNTER — Encounter: Payer: No Typology Code available for payment source | Admitting: Physical Therapy

## 2023-03-21 ENCOUNTER — Encounter: Payer: No Typology Code available for payment source | Admitting: Physical Therapy

## 2023-03-22 ENCOUNTER — Encounter: Payer: Self-pay | Admitting: Physical Medicine and Rehabilitation

## 2023-03-25 ENCOUNTER — Encounter: Payer: No Typology Code available for payment source | Admitting: Physical Therapy

## 2023-03-25 ENCOUNTER — Ambulatory Visit: Payer: No Typology Code available for payment source | Admitting: Nurse Practitioner

## 2023-03-25 ENCOUNTER — Ambulatory Visit: Payer: PRIVATE HEALTH INSURANCE

## 2023-03-25 ENCOUNTER — Ambulatory Visit: Payer: BC Managed Care – PPO | Admitting: Physician Assistant

## 2023-03-25 VITALS — BP 108/72 | HR 87 | Temp 97.8°F | Ht 60.0 in | Wt 199.5 lb

## 2023-03-25 DIAGNOSIS — F419 Anxiety disorder, unspecified: Secondary | ICD-10-CM

## 2023-03-25 DIAGNOSIS — Z1159 Encounter for screening for other viral diseases: Secondary | ICD-10-CM

## 2023-03-25 DIAGNOSIS — R1084 Generalized abdominal pain: Secondary | ICD-10-CM | POA: Diagnosis not present

## 2023-03-25 DIAGNOSIS — Z13220 Encounter for screening for lipoid disorders: Secondary | ICD-10-CM

## 2023-03-25 DIAGNOSIS — M5459 Other low back pain: Secondary | ICD-10-CM

## 2023-03-25 DIAGNOSIS — M5431 Sciatica, right side: Secondary | ICD-10-CM

## 2023-03-25 DIAGNOSIS — M797 Fibromyalgia: Secondary | ICD-10-CM | POA: Diagnosis not present

## 2023-03-25 DIAGNOSIS — L608 Other nail disorders: Secondary | ICD-10-CM | POA: Diagnosis not present

## 2023-03-25 DIAGNOSIS — M25551 Pain in right hip: Secondary | ICD-10-CM

## 2023-03-25 DIAGNOSIS — Z79891 Long term (current) use of opiate analgesic: Secondary | ICD-10-CM | POA: Insufficient documentation

## 2023-03-25 DIAGNOSIS — Z136 Encounter for screening for cardiovascular disorders: Secondary | ICD-10-CM

## 2023-03-25 DIAGNOSIS — G8929 Other chronic pain: Secondary | ICD-10-CM

## 2023-03-25 DIAGNOSIS — Z1231 Encounter for screening mammogram for malignant neoplasm of breast: Secondary | ICD-10-CM

## 2023-03-25 DIAGNOSIS — Z131 Encounter for screening for diabetes mellitus: Secondary | ICD-10-CM | POA: Insufficient documentation

## 2023-03-25 MED ORDER — CLONAZEPAM 1 MG PO TABS: 1.0000 mg | ORAL_TABLET | Freq: Two times a day (BID) | ORAL | 0 refills | Status: DC | PRN

## 2023-03-25 NOTE — Assessment & Plan Note (Signed)
Labs ordered, further recommendations may be made based upon his results. 

## 2023-03-25 NOTE — Assessment & Plan Note (Signed)
Screening mammogram ordered

## 2023-03-25 NOTE — Assessment & Plan Note (Signed)
Chronic, stable Clonazepam 1 mg by mouth twice a day as needed refill sent to pharmacy Also continue duloxetine 60 mg twice daily and hydroxyzine 25 mg 3 times daily as needed

## 2023-03-25 NOTE — Assessment & Plan Note (Signed)
Etiology unclear, referral to podiatry made today.

## 2023-03-25 NOTE — Assessment & Plan Note (Signed)
Chronic, getting worse Refer to GI

## 2023-03-25 NOTE — Therapy (Signed)
OUTPATIENT PHYSICAL THERAPY THORACOLUMBAR TREATMENT   Patient Name: Diana Giles MRN: [id number] DOB:06/13/1971, 52 y.o., female Today's Date: 03/25/2023   END OF SESSION:  PT End of Session - 03/25/23 0822     Visit Number 5    Date for PT Re-Evaluation 04/29/23    Authorization Type [employer]    Authorization Time Period 03/04/23 to 04/29/23    PT Start Time 0819    PT Stop Time 0900    PT Time Calculation (min) 41 min    Activity Tolerance Patient tolerated treatment well;Patient limited by pain    Behavior During Therapy WFL for tasks assessed/performed               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    Mental disorder    Rheumatoid arthritis (HCC)    Sinus node dysfunction (HCC)    s/p pacemaker   Thrombocytosis    Vaginal Pap smear, abnormal    Past Surgical History:  Procedure Laterality Date   GASTRIC BYPASS  2010   285lbs at highest weight   HYSTEROSCOPY WITH D & C  08/22/2019   OTHER SURGICAL HISTORY  08/2019   Bilateral fallopian tube removal   PACEMAKER PLACEMENT  08/06/2020   REVISION TOTAL KNEE ARTHROPLASTY Right 06/01/2019   SHOULDER ARTHROSCOPY Left 2018   TUBAL LIGATION Bilateral 08/07/2019   Patient Active Problem List   Diagnosis Date Noted   COVID-19 11/26/2022   Chronic bilateral low back pain with right-sided sciatica 09/15/2022   Arthritis pain 09/15/2022   Fibromyalgia 09/15/2022   Chronic pain syndrome 09/15/2022   Bilateral sacroiliitis (HCC) 09/15/2022   Routine Papanicolaou smear 08/09/2022   Postmenopause 08/09/2022   Low TSH level 08/05/2022   Snoring 08/05/2022   Boil of buttock 07/26/2022   Pacemaker 01/07/2021   Rheumatoid arthritis (HCC) 11/13/2020   Fatigue 11/13/2020   Class 2 obesity with body mass index (BMI)  of 37.0 to 37.9 in adult 11/13/2020   DDD (degenerative disc disease), lumbar 11/13/2020   Avascular necrosis of bone of hip, right (HCC) 11/13/2020   Sinus node dysfunction (HCC) 11/13/2020   Thrombocytosis 11/13/2020   Anxiety 11/13/2020   Attention deficit hyperactivity disorder (ADHD) 11/13/2020    PCP: [name], NP    REFERRING PROVIDER: Elijah Birk, DO  REFERRING DIAG: Chronic bilateral LBP with Rt sided sciatica; fibromyalgia; chronic pain syndrome; bilateral sacroiliitis  Rationale for Evaluation and Treatment: Rehabilitation  THERAPY DIAG:  Other low back pain  Pain in right hip  Sciatica, right side  Chronic pain of right knee  ONSET DATE:  SUBJECTIVE:  SUBJECTIVE STATEMENT:  Pt reports she had an ok birthday yesterday.  Pt notes she had to work on her birthday, but was able to rest last night and just relax.   EVAL: Pt states that she has been dealing with pain for atleast 20 years, but in the past 5 years its gotten worse with falls, injuries, etc. She was started on Gabapentin and feels this has helped her better pinpoint her pain. Pain is sacrum into the Rt buttock and lateral thigh down around the knee. She has numbness in the lateral foot. She has had PT several times in the past. She is interested in aquatic PT because it's the only thing she hasn't done to help the pain.  Pain is often so bad that she can't get out of bed.   PERTINENT HISTORY:  Rt hip avascular necrosis of hip Anxiety  DDD Fibromyalgia  Rheumatoid Arthritis  Lt shoulder arthroplasty Rt total knee replacement and revision 2020 Pace maker   PAIN:  Are you having pain? Yes: NPRS scale: 5/10 Pain location: sacrum and Rt buttock, Rt LE/lateral knee Pain description: constant  Aggravating factors:  sitting, supine, prolonged activity/walking  Relieving factors: avoiding prolonged activity   PRECAUTIONS: None  WEIGHT BEARING RESTRICTIONS: No  FALLS:  Has patient fallen in last 6 months? No  LIVING ENVIRONMENT: Lives with: lives with their family Lives in: House/apartment Stairs: No Has following equipment at home: None  OCCUPATION: Phlebotomist   PLOF: Independent  PATIENT GOALS: decrease pain  NEXT MD VISIT: f/u on MRI soon  OBJECTIVE:   DIAGNOSTIC FINDINGS:  MRI ordered   PATIENT SURVEYS:  Need next visit  SCREENING FOR RED FLAGS: Bowel or bladder incontinence: No Spinal tumors: No Cauda equina syndrome: No Compression fracture: No Abdominal aneurysm: No  COGNITION: Overall cognitive status: Within functional limits for tasks assessed     SENSATION: Pt notes numbness   MUSCLE LENGTH: Hamstrings: Right WNL deg; Left WNL deg Maisie Fus test: Right (+) Rt for quad and hip flexor  POSTURE: No Significant postural limitations  PALPATION: Tenderness Rt lateral gastroc/peroneals, Rt lateral quad/ITB, Rt glutes  LUMBAR ROM:   AROM eval  Flexion   Extension   Right lateral flexion   Left lateral flexion   Right rotation   Left rotation    (Blank rows = not tested)  LOWER EXTREMITY ROM:     Active  Right eval Left eval  Hip flexion    Hip extension    Hip abduction    Hip adduction    Hip internal rotation    Hip external rotation    Knee flexion    Knee extension    Ankle dorsiflexion    Ankle plantarflexion    Ankle inversion    Ankle eversion     (Blank rows = not tested)  LOWER EXTREMITY MMT:    MMT Right eval Left eval  Hip flexion 4 4  Hip extension 3 4  Hip abduction 3 4  Hip adduction    Hip internal rotation    Hip external rotation    Knee flexion 5 5  Knee extension 4 5  Ankle dorsiflexion    Ankle plantarflexion Heel raise unable Heel raise x6  Ankle inversion    Ankle eversion     (Blank rows = not  tested)  LUMBAR SPECIAL TESTS:  Next visit  FUNCTIONAL TESTS:  5 times sit to stand: 18 sec, weight shifted Lt  Timed up and go (TUG): 11 sec  GAIT:  Distance walked: 55ft  Assistive device utilized: None Level of assistance: Complete Independence Comments: decreased stance on Rt, decreased step length on Lt, decreased push off on Rt   TODAY'S TREATMENT:   Date: 03/25/23  TherEx:  Nustep lvl 5, 5 min with PT present for subjective  LTR x30  Hooklying bridging with glute squeeze prior to lift off, 2x10 PPT x20  SKTC 2x30 sec, Abdominal bracing with 5 sec hold, x10 Hip IR x 20, bilat  Figure 4 stretch 4x30 sec on R LE. ITB stretch in supine position with strap for increased ability to perform, 30 sec holds   Trigger Point Dry Needling (TDN), unbilled, 6 minute Education performed with patient regarding potential benefit of TDN. Reviewed precautions and risks with patient. Reviewed special precautions/risks over lung fields which include pneumothorax. Reviewed signs and symptoms of pneumothorax and advised pt to go to ER immediately if these symptoms develop advise them of dry needling treatment. Extensive time spent with pt to ensure full understanding of TDN risks. Pt provided verbal consent to treatment. TDN performed to R glute and piriformis of the R side with 0.30 x 100 single needle placements with local twitch response (LTR). Pistoning technique utilized. Improved pain-free motion following intervention.       PATIENT EDUCATION:  Education details: eval findings/POC; implemented HEP; benefits of aquatic PT; chronic pain response Person educated: Patient Education method: Explanation and Handouts Education comprehension: verbalized understanding and returned demonstration  HOME EXERCISE PROGRAM: Access Code: [email or web address] URL: [email or web address] Date: 03/04/2023 Prepared by: [facility] - Outpatient Rehab - [facility]  Exercises -  Modified Maisie Fus Stretch  - 2 x daily - 7 x weekly - 2 sets - 20 seconds hold - Heel Raises with Counter Support with Brace On  - 3 x daily - 7 x weekly - 10 reps  ASSESSMENT:  CLINICAL IMPRESSION:  Pt continues to respond well to the exercises and has benefited from manual therapy and stretching thus far.  Pt encouraged to continue with stretching at home as part of HEP and she reports she has been more consistent in doing so recently.  Pt is making good progress and will continue to benefit from manual therapy including dry needling at future sessions moving forward.   Pt will continue to benefit from skilled therapy to address remaining deficits in order to improve overall QoL and return to PLOF.        OBJECTIVE IMPAIRMENTS: Abnormal gait, decreased activity tolerance, decreased balance, decreased endurance, decreased strength, impaired flexibility, and pain.   ACTIVITY LIMITATIONS: lifting, bending, sitting, standing, squatting, stairs, locomotion level, and caring for others  PARTICIPATION LIMITATIONS: cleaning, laundry, interpersonal relationship, shopping, community activity, occupation, and yard work  PERSONAL FACTORS: Age, Fitness, Past/current experiences, Time since onset of injury/illness/exacerbation, and 3+ comorbidities: Rt hip avascular necrosis, fibromyalgia, Rt TKA with revision  are also affecting patient's functional outcome.   REHAB POTENTIAL: Good  CLINICAL DECISION MAKING: Evolving/moderate complexity  EVALUATION COMPLEXITY: Moderate   GOALS: Goals reviewed with patient? Yes  SHORT TERM GOALS: Target date: 03/11/23  Pt will be independent with her initial HEP to improve LE strength and flexibility. Baseline: Goal status: INITIAL   LONG TERM GOALS: Target date: 04/29/23  Pt will report atleast 40% improvement in her activity participation from the start of PT. Baseline:  Goal status: INITIAL  2.  Pt will have improved hip strength to atleast 4/5 MMT  which will increase efficiency with daily activity. Baseline:  Goal status: INITIAL  3.  Pt will have improved Rt hip flexibility evident by negative thomas test.  Baseline:  Goal status: INITIAL  4.  Pt will have improved gastroc strength bilaterally, able to complete atleast 15 consecutive single leg heel raises. Baseline:  Goal status: INITIAL  5.  Pt will be independent with an advanced HEP to increase  Baseline:  Goal status: INITIAL   PLAN:  PT FREQUENCY: 2x/week  PT DURATION: 8 weeks  PLANNED INTERVENTIONS: Therapeutic exercises, Therapeutic activity, Neuromuscular re-education, Balance training, Gait training, Patient/Family education, Self Care, Joint mobilization, Aquatic Therapy, Dry Needling, Moist heat, Taping, Manual therapy, and Re-evaluation.  PLAN FOR NEXT SESSION: possible DN to back/glutes/quads/gastroc; progress hip strength and add to HEP as able   Nolon Bussing, PT, DPT Physical Therapist - [facility]  03/25/23, 11:07 AM

## 2023-03-25 NOTE — Progress Notes (Signed)
Established Patient Office Visit  Subjective   Patient ID: Diana Giles, female    DOB: 1971-11-03  Age: 52 y.o. MRN: [id number]  Chief Complaint  Patient presents with   GI Problem    Cramping, bloating constipated     Constipation/Abdominal Pain: intermittent, becoming more frequent. Worsens with food intake. Reports history of gastric bypass, h. Pylori, and hiatal hernia. Has increased gas and flatulence diarrhea at times. Has reduced caffeine and soda intake. Takes omeprazole as needed for indigestion. Denies blood in stool, nausea, vomiting. Last colonoscopy done in [place] state in 2016.   Toe color change: left 2nd toe appears to be turning darker in color. Has been treating with topical OTC antifungal.  Denies pain.  Health maintenance: Due for breast cancer screening and labs.  Anxiety: Continues on clonazepam 1 mg twice a day as needed for anxiety as well as duloxetine 60 mg twice daily and hydroxyzine 25 mg 3 times daily as needed.    Review of Systems  Respiratory:  Negative for shortness of breath.   Cardiovascular:  Negative for chest pain.  Gastrointestinal:  Positive for abdominal pain, constipation, diarrhea and heartburn. Negative for blood in stool, nausea and vomiting.      Objective:     BP 108/72   Pulse 87   Temp 97.8 F (36.6 C) (Temporal)   Ht 5' (1.524 m)   Wt 199 lb 8 oz (90.5 kg)   SpO2 97%   BMI 38.96 kg/m    Physical Exam Vitals reviewed.  Constitutional:      General: She is not in acute distress.    Appearance: Normal appearance.  HENT:     Head: Normocephalic and atraumatic.  Neck:     Vascular: No carotid bruit.  Cardiovascular:     Rate and Rhythm: Normal rate and regular rhythm.     Pulses:          Dorsalis pedis pulses are 0 on the left side.       Posterior tibial pulses are 2+ on the left side.     Heart sounds: Normal heart sounds.  Pulmonary:     Effort: Pulmonary effort is normal.     Breath sounds: Normal breath  sounds.  Abdominal:     General: Abdomen is flat. Bowel sounds are normal.     Palpations: Abdomen is soft.     Tenderness: There is abdominal tenderness in the left upper quadrant. There is no guarding.  Feet:     Left foot:     Toenail Condition: Fungal disease present.    Comments: Slight hyperpigmentation noted to left 2nd digit Skin:    General: Skin is warm and dry.  Neurological:     General: No focal deficit present.     Mental Status: She is alert and oriented to person, place, and time.  Psychiatric:        Mood and Affect: Mood normal.        Behavior: Behavior normal.        Judgment: Judgment normal.      No results found for any visits on 03/25/23.    The 10-year ASCVD risk score (Arnett DK, et al., 2019) is: 0.8%    Assessment & Plan:   Problem List Items Addressed This Visit       Other   Anxiety    Chronic, stable Clonazepam 1 mg by mouth twice a day as needed refill sent to pharmacy Also continue duloxetine 60 mg twice  daily and hydroxyzine 25 mg 3 times daily as needed      Relevant Medications   clonazePAM (KLONOPIN) 1 MG tablet   Other Relevant Orders   Comprehensive metabolic panel   TSH   Fibromyalgia    Labs ordered, further recommendations may be made based upon his results       Relevant Medications   clonazePAM (KLONOPIN) 1 MG tablet   Other Relevant Orders   Comprehensive metabolic panel   TSH   Generalized abdominal pain - Primary    Chronic, getting worse Refer to GI       Relevant Orders   Ambulatory referral to Gastroenterology   Change in nail appearance    Etiology unclear, referral to podiatry made today.      Relevant Orders   Ambulatory referral to Podiatry   Diabetes mellitus screening    Labs ordered, further recommendations may be made based upon his results       Relevant Orders   Lipid panel   Hemoglobin A1c   Encounter for lipid screening for cardiovascular disease    Labs ordered, further  recommendations may be made based upon his results        Relevant Orders   Lipid panel   Hemoglobin A1c   Encounter for hepatitis C screening test for low risk patient    Labs ordered, further recommendations may be made based upon his results       Relevant Orders   Hepatitis C antibody   Encounter for screening mammogram for malignant neoplasm of breast    Screening mammogram ordered      Relevant Orders   MM 3D SCREENING MAMMOGRAM BILATERAL BREAST    Return in about 3 months (around 06/25/2023) for F/U with Charlotta Lapaglia allow 40 minutes.  Total time spent on encounter today was 32 minutes including face-to-face evaluation, review of previous records, and discussion/development of treatment plan.   [name], NP

## 2023-03-28 ENCOUNTER — Encounter: Payer: No Typology Code available for payment source | Admitting: Physical Therapy

## 2023-03-31 ENCOUNTER — Ambulatory Visit: Payer: PRIVATE HEALTH INSURANCE

## 2023-03-31 DIAGNOSIS — M5459 Other low back pain: Secondary | ICD-10-CM

## 2023-03-31 DIAGNOSIS — M5431 Sciatica, right side: Secondary | ICD-10-CM

## 2023-03-31 DIAGNOSIS — G8929 Other chronic pain: Secondary | ICD-10-CM

## 2023-03-31 DIAGNOSIS — M25551 Pain in right hip: Secondary | ICD-10-CM

## 2023-03-31 NOTE — Therapy (Signed)
OUTPATIENT PHYSICAL THERAPY THORACOLUMBAR TREATMENT   Patient Name: Diana Giles MRN: [id number] DOB:September 23, 1971, 52 y.o., female Today's Date: 03/31/2023   END OF SESSION:  PT End of Session - 03/31/23 1352     Visit Number 6    Date for PT Re-Evaluation 04/29/23    Authorization Type [employer]    Authorization Time Period 03/04/23 to 04/29/23    PT Start Time 1351    PT Stop Time 1430    PT Time Calculation (min) 39 min    Activity Tolerance Patient tolerated treatment well;Patient limited by pain    Behavior During Therapy WFL for tasks assessed/performed               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    Mental disorder    Rheumatoid arthritis (HCC)    Sinus node dysfunction (HCC)    s/p pacemaker   Thrombocytosis    Vaginal Pap smear, abnormal    Past Surgical History:  Procedure Laterality Date   GASTRIC BYPASS  2010   285lbs at highest weight   HYSTEROSCOPY WITH D & C  08/22/2019   OTHER SURGICAL HISTORY  08/2019   Bilateral fallopian tube removal   PACEMAKER PLACEMENT  08/06/2020   REVISION TOTAL KNEE ARTHROPLASTY Right 06/01/2019   SHOULDER ARTHROSCOPY Left 2018   TUBAL LIGATION Bilateral 08/07/2019   Patient Active Problem List   Diagnosis Date Noted   Generalized abdominal pain 03/25/2023   Change in nail appearance 03/25/2023   Diabetes mellitus screening 03/25/2023   Encounter for lipid screening for cardiovascular disease 03/25/2023   Encounter for hepatitis C screening test for low risk patient 03/25/2023   Encounter for screening mammogram for malignant neoplasm of breast 03/25/2023   COVID-19 11/26/2022   Chronic bilateral low back pain with right-sided sciatica 09/15/2022   Arthritis pain 09/15/2022   Fibromyalgia 09/15/2022   Chronic  pain syndrome 09/15/2022   Bilateral sacroiliitis (HCC) 09/15/2022   Postmenopause 08/09/2022   Pacemaker 01/07/2021   Fatigue 11/13/2020   DDD (degenerative disc disease), lumbar 11/13/2020   Avascular necrosis of bone of hip, right (HCC) 11/13/2020   Sinus node dysfunction (HCC) 11/13/2020   Thrombocytosis 11/13/2020   Anxiety 11/13/2020   Attention deficit hyperactivity disorder (ADHD) 11/13/2020    PCP: [name], NP    REFERRING PROVIDER: Elijah Birk, DO  REFERRING DIAG: Chronic bilateral LBP with Rt sided sciatica; fibromyalgia; chronic pain syndrome; bilateral sacroiliitis  Rationale for Evaluation and Treatment: Rehabilitation  THERAPY DIAG:  No diagnosis found.  ONSET DATE:  SUBJECTIVE:  SUBJECTIVE STATEMENT:  Pt notes nothing new other than just being tired.  Pt notes she is experiencing some R knee pain at this time.  She notes that her low back was bothering her more today and yesterday due to having to work with several MD's yesterday.    EVAL: Pt states that she has been dealing with pain for atleast 20 years, but in the past 5 years its gotten worse with falls, injuries, etc. She was started on Gabapentin and feels this has helped her better pinpoint her pain. Pain is sacrum into the Rt buttock and lateral thigh down around the knee. She has numbness in the lateral foot. She has had PT several times in the past. She is interested in aquatic PT because it's the only thing she hasn't done to help the pain.  Pain is often so bad that she can't get out of bed.   PERTINENT HISTORY:  Rt hip avascular necrosis of hip Anxiety  DDD Fibromyalgia  Rheumatoid Arthritis  Lt shoulder arthroplasty Rt total knee replacement and revision 2020 Pace maker   PAIN:  Are you having pain? Yes:  NPRS scale: 5/10 Pain location: sacrum and Rt buttock, Rt LE/lateral knee Pain description: constant  Aggravating factors: sitting, supine, prolonged activity/walking  Relieving factors: avoiding prolonged activity   PRECAUTIONS: None  WEIGHT BEARING RESTRICTIONS: No  FALLS:  Has patient fallen in last 6 months? No  LIVING ENVIRONMENT: Lives with: lives with their family Lives in: House/apartment Stairs: No Has following equipment at home: None  OCCUPATION: Phlebotomist   PLOF: Independent  PATIENT GOALS: decrease pain  NEXT MD VISIT: f/u on MRI soon  OBJECTIVE:   DIAGNOSTIC FINDINGS:  MRI ordered   PATIENT SURVEYS:  Need next visit  SCREENING FOR RED FLAGS: Bowel or bladder incontinence: No Spinal tumors: No Cauda equina syndrome: No Compression fracture: No Abdominal aneurysm: No  COGNITION: Overall cognitive status: Within functional limits for tasks assessed     SENSATION: Pt notes numbness   MUSCLE LENGTH: Hamstrings: Right WNL deg; Left WNL deg Maisie Fus test: Right (+) Rt for quad and hip flexor  POSTURE: No Significant postural limitations  PALPATION: Tenderness Rt lateral gastroc/peroneals, Rt lateral quad/ITB, Rt glutes  LUMBAR ROM:   AROM eval  Flexion   Extension   Right lateral flexion   Left lateral flexion   Right rotation   Left rotation    (Blank rows = not tested)  LOWER EXTREMITY ROM:     Active  Right eval Left eval  Hip flexion    Hip extension    Hip abduction    Hip adduction    Hip internal rotation    Hip external rotation    Knee flexion    Knee extension    Ankle dorsiflexion    Ankle plantarflexion    Ankle inversion    Ankle eversion     (Blank rows = not tested)  LOWER EXTREMITY MMT:    MMT Right eval Left eval  Hip flexion 4 4  Hip extension 3 4  Hip abduction 3 4  Hip adduction    Hip internal rotation    Hip external rotation    Knee flexion 5 5  Knee extension 4 5  Ankle dorsiflexion     Ankle plantarflexion Heel raise unable Heel raise x6  Ankle inversion    Ankle eversion     (Blank rows = not tested)  LUMBAR SPECIAL TESTS:  Next visit  FUNCTIONAL TESTS:  5 times  sit to stand: 18 sec, weight shifted Lt  Timed up and go (TUG): 11 sec  GAIT: Distance walked: 37ft  Assistive device utilized: None Level of assistance: Complete Independence Comments: decreased stance on Rt, decreased step length on Lt, decreased push off on Rt    TODAY'S TREATMENT:   Date: 03/31/23  TherEx:  LTR x30  Hooklying bridging with glute squeeze prior to lift off, 2x10 PPT x20  Abdominal bracing with 5 sec hold, x10 Hip IR x 20, bilat Figure 4 stretch 4x30 sec on R LE ITB stretch in supine position with strap for increased ability to perform, 30 sec holds   Trigger Point Dry Needling (TDN), unbilled, 6 minute  Education performed with patient regarding potential benefit of TDN. Reviewed precautions and risks with patient. Reviewed special precautions/risks over lung fields which include pneumothorax. Reviewed signs and symptoms of pneumothorax and advised pt to go to ER immediately if these symptoms develop advise them of dry needling treatment. Extensive time spent with pt to ensure full understanding of TDN risks. Pt provided verbal consent to treatment. TDN performed to the R and L TFL with 0.30 x 70 mm single needle placements with local twitch response (LTR).  TDN performed to R glute and piriformis of the R side with 0.30 x 100 single needle placements with local twitch response (LTR). Pistoning technique utilized. Improved pain-free motion following intervention.       PATIENT EDUCATION:  Education details: eval findings/POC; implemented HEP; benefits of aquatic PT; chronic pain response Person educated: Patient Education method: Explanation and Handouts Education comprehension: verbalized understanding and returned demonstration  HOME EXERCISE PROGRAM: Access Code:  [email or web address] URL: [email or web address] Date: 03/04/2023 Prepared by: [facility] - [facility]  Exercises - Modified Maisie Fus Stretch  - 2 x daily - 7 x weekly - 2 sets - 20 seconds hold - Heel Raises with Counter Support with Brace On  - 3 x daily - 7 x weekly - 10 reps  ASSESSMENT:  CLINICAL IMPRESSION:  Pt responded well to the manual therapy approach and noted to have a reduction in the trigger points of the ITB and TFL following manual therapy and dry needling.  Pt also noted soreness immediately following R sided piriformis needling as expected.  Pt advised to continue to move along the trip to [place] and to be safe with driving.  Pt will be seen next week and benefit from increasing core stability exercises.   Pt will continue to benefit from skilled therapy to address remaining deficits in order to improve overall QoL and return to PLOF.         OBJECTIVE IMPAIRMENTS: Abnormal gait, decreased activity tolerance, decreased balance, decreased endurance, decreased strength, impaired flexibility, and pain.   ACTIVITY LIMITATIONS: lifting, bending, sitting, standing, squatting, stairs, locomotion level, and caring for others  PARTICIPATION LIMITATIONS: cleaning, laundry, interpersonal relationship, shopping, community activity, occupation, and yard work  PERSONAL FACTORS: Age, Fitness, Past/current experiences, Time since onset of injury/illness/exacerbation, and 3+ comorbidities: Rt hip avascular necrosis, fibromyalgia, Rt TKA with revision  are also affecting patient's functional outcome.   REHAB POTENTIAL: Good  CLINICAL DECISION MAKING: Evolving/moderate complexity  EVALUATION COMPLEXITY: Moderate   GOALS: Goals reviewed with patient? Yes  SHORT TERM GOALS: Target date: 03/11/23  Pt will be independent with her initial HEP to improve LE strength and flexibility. Baseline: Goal status: INITIAL   LONG TERM GOALS: Target date:  04/29/23  Pt will report atleast 40% improvement in her activity  participation from the start of PT. Baseline:  Goal status: INITIAL  2.  Pt will have improved hip strength to atleast 4/5 MMT which will increase efficiency with daily activity. Baseline:  Goal status: INITIAL  3.  Pt will have improved Rt hip flexibility evident by negative thomas test.  Baseline:  Goal status: INITIAL  4.  Pt will have improved gastroc strength bilaterally, able to complete atleast 15 consecutive single leg heel raises. Baseline:  Goal status: INITIAL  5.  Pt will be independent with an advanced HEP to increase  Baseline:  Goal status: INITIAL   PLAN:  PT FREQUENCY: 2x/week  PT DURATION: 8 weeks  PLANNED INTERVENTIONS: Therapeutic exercises, Therapeutic activity, Neuromuscular re-education, Balance training, Gait training, Patient/Family education, Self Care, Joint mobilization, Aquatic Therapy, Dry Needling, Moist heat, Taping, Manual therapy, and Re-evaluation.  PLAN FOR NEXT SESSION: see response to DN and introduce more core stability exercises.   Nolon Bussing, PT, DPT Physical Therapist - [facility]  03/31/23, 1:53 PM

## 2023-04-01 ENCOUNTER — Ambulatory Visit: Payer: PRIVATE HEALTH INSURANCE

## 2023-04-01 ENCOUNTER — Encounter: Payer: No Typology Code available for payment source | Admitting: Physical Therapy

## 2023-04-05 ENCOUNTER — Encounter: Payer: Self-pay | Admitting: Emergency Medicine

## 2023-04-05 ENCOUNTER — Emergency Department
Admission: EM | Admit: 2023-04-05 | Discharge: 2023-04-05 | Disposition: A | Discharge status: Home / Self Care | Payer: BLUE CROSS/BLUE SHIELD | Attending: Emergency Medicine | Admitting: Emergency Medicine

## 2023-04-05 ENCOUNTER — Emergency Department: Payer: BLUE CROSS/BLUE SHIELD

## 2023-04-05 ENCOUNTER — Other Ambulatory Visit: Payer: Self-pay

## 2023-04-05 DIAGNOSIS — R109 Unspecified abdominal pain: Secondary | ICD-10-CM | POA: Diagnosis present

## 2023-04-05 LAB — CBC
HCT: 35 % — ABNORMAL LOW (ref 36.0–46.0)
Hemoglobin: 10.9 g/dL — ABNORMAL LOW (ref 12.0–15.0)
MCH: 28.3 pg (ref 26.0–34.0)
MCHC: 31.1 g/dL (ref 30.0–36.0)
MCV: 90.9 fL (ref 80.0–100.0)
Platelets: 378 10*3/uL (ref 150–400)
RBC: 3.85 MIL/uL — ABNORMAL LOW (ref 3.87–5.11)
RDW: 14.3 % (ref 11.5–15.5)
WBC: 6.7 10*3/uL (ref 4.0–10.5)
nRBC: 0 % (ref 0.0–0.2)

## 2023-04-05 LAB — URINALYSIS, ROUTINE W REFLEX MICROSCOPIC
Bilirubin Urine: NEGATIVE
Glucose, UA: NEGATIVE mg/dL
Hgb urine dipstick: NEGATIVE
Ketones, ur: NEGATIVE mg/dL
Leukocytes,Ua: NEGATIVE
Nitrite: NEGATIVE
Protein, ur: NEGATIVE mg/dL
Specific Gravity, Urine: 1.015 (ref 1.005–1.030)
pH: 8 (ref 5.0–8.0)

## 2023-04-05 LAB — BASIC METABOLIC PANEL
Anion gap: 8 (ref 5–15)
BUN: 14 mg/dL (ref 6–20)
CO2: 24 mmol/L (ref 22–32)
Calcium: 8.6 mg/dL — ABNORMAL LOW (ref 8.9–10.3)
Chloride: 108 mmol/L (ref 98–111)
Creatinine, Ser: 0.67 mg/dL (ref 0.44–1.00)
GFR, Estimated: >60 mL/min (ref >60)
Glucose, Bld: 91 mg/dL (ref 70–99)
Potassium: 4.3 mmol/L (ref 3.5–5.1)
Sodium: 140 mmol/L (ref 135–145)

## 2023-04-05 LAB — HEPATIC FUNCTION PANEL
ALT: 13 U/L (ref 0–44)
AST: 18 U/L (ref 15–41)
Albumin: 3.4 g/dL — ABNORMAL LOW (ref 3.5–5.0)
Alkaline Phosphatase: 99 U/L (ref 38–126)
Bilirubin, Direct: <0.1 mg/dL (ref 0.0–0.2)
Total Bilirubin: 0.4 mg/dL (ref 0.3–1.2)
Total Protein: 6.6 g/dL (ref 6.5–8.1)

## 2023-04-05 LAB — LIPASE, BLOOD: Lipase: 30 U/L (ref 11–51)

## 2023-04-05 MED ORDER — ONDANSETRON HCL 4 MG PO TABS: 4.0000 mg | ORAL_TABLET | Freq: Every day | ORAL | 1 refills | Status: DC | PRN

## 2023-04-05 MED ORDER — ONDANSETRON 4 MG PO TBDP
4.0000 mg | ORAL_TABLET | Freq: Once | ORAL | Status: AC
  Administered 2023-04-05: 4 mg via ORAL
  Filled 2023-04-05: qty 1

## 2023-04-05 NOTE — ED Provider Notes (Signed)
[facility] Provider Note    Event Date/Time   First MD Initiated Contact with Patient 04/05/23 1338     (approximate)   History   Flank Pain   HPI  Diana Giles is a 52 y.o. female   Past medical history of rheumatoid arthritis, fibromyalgia, anxiety, chronic abdominal pain who presents with Recurrence of left-sided abdominal pain/flank pain.  Several days.  Similar in the past many episodes.  Gastroenterology referral placed, bv primary but has not met with them yet.    No urinary symptoms.  Has had some diarrhea.  No GI bleeding.  No fever or chills.  Hx renal stones  External Medical Documents Reviewed: Team 2024 outpatient internal medicine visit documenting similar abdominal pain with referral to GI      Physical Exam   Triage Vital Signs: ED Triage Vitals  Enc Vitals Group     BP 04/05/23 1201 129/83     Pulse Rate 04/05/23 1201 78     Resp 04/05/23 1201 18     Temp 04/05/23 1201 98.3 F (36.8 C)     Temp Source 04/05/23 1201 Oral     SpO2 04/05/23 1201 95 %     Weight --      Height --      Head Circumference --      Peak Flow --      Pain Score 04/05/23 1159 6     Pain Loc --      Pain Edu? --      Excl. in GC? --     Most recent vital signs: Vitals:   04/05/23 1201  BP: 129/83  Pulse: 78  Resp: 18  Temp: 98.3 F (36.8 C)  SpO2: 95%    General: Awake, no distress.  CV:  Good peripheral perfusion.  Resp:  Normal effort.  Abd:  No distention.  Other:  Mild left-sided tenderness to palpation without rigidity or guarding to the abdomen, no CVA tenderness.  Awake alert oriented not in toxic comfortable appearing with normal vital signs afebrile.   ED Results / Procedures / Treatments   Labs (all labs ordered are listed, but only abnormal results are displayed) Labs Reviewed  URINALYSIS, ROUTINE W REFLEX MICROSCOPIC - Abnormal; Notable for the following components:      Result Value   Color, Urine YELLOW (*)     APPearance CLEAR (*)    All other components within normal limits  BASIC METABOLIC PANEL - Abnormal; Notable for the following components:   Calcium 8.6 (*)    All other components within normal limits  CBC - Abnormal; Notable for the following components:   RBC 3.85 (*)    Hemoglobin 10.9 (*)    HCT 35.0 (*)    All other components within normal limits  HEPATIC FUNCTION PANEL - Abnormal; Notable for the following components:   Albumin 3.4 (*)    All other components within normal limits  LIPASE, BLOOD     I ordered and reviewed the above labs they are notable for white blood cell count is normal.  Urine shows no bacteria or inflammatory cells.   RADIOLOGY I independently reviewed and interpreted CT scan of the abdomen pelvis and see no obvious obstructive or inflammatory changes.   PROCEDURES:  Critical Care performed: No  Procedures   MEDICATIONS ORDERED IN ED: Medications  ondansetron (ZOFRAN-ODT) disintegrating tablet 4 mg (4 mg Oral Given 04/05/23 1354)     IMPRESSION / MDM / ASSESSMENT AND  PLAN / ED COURSE  I reviewed the triage vital signs and the nursing notes.                                Patient's presentation is most consistent with acute presentation with potential threat to life or bodily function.  Differential diagnosis includes, but is not limited to, diverticulitis, urinary tract infection, nephrolithiasis, IBS   The patient is on the cardiac monitor to evaluate for evidence of arrhythmia and/or significant heart rate changes.  MDM: Patient with chronic pain mild tenderness to the left side got a CT scan which shows no intra-abdominal pathologies.  Urinalysis is normal she looks well with benign exam and unremarkable workup as above with chronicity of symptoms I doubt emergent pathology at this time so she can follow-up with GI as scheduled.         FINAL CLINICAL IMPRESSION(S) / ED DIAGNOSES   Final diagnoses:  Left flank pain      Rx / DC Orders   ED Discharge Orders          Ordered    ondansetron (ZOFRAN) 4 MG tablet  Daily PRN        04/05/23 1351    Ambulatory referral to Gastroenterology        04/05/23 1454             Note:  This document was prepared using Dragon voice recognition software and may include unintentional dictation errors.    Pilar Jarvis, MD 04/05/23 [phone]

## 2023-04-05 NOTE — Discharge Instructions (Signed)
Take Zofran as needed for nausea/vomiting.  Follow-up with gastroenterology as planned.  If you have any new, worsening, or expected symptoms come back to the emergency department for reevaluation.

## 2023-04-05 NOTE — ED Triage Notes (Signed)
Patient to ED via POV for left sided abd pain "for a few weeks." Patient has seen PCP and referred to GI but unable to get in. Had diarrhea and nausea as well. Taking imodium. Worse after eating.

## 2023-04-06 ENCOUNTER — Telehealth: Payer: Self-pay

## 2023-04-06 NOTE — Transitions of Care (Post Inpatient/ED Visit) (Unsigned)
   04/06/2023  Name: Diana Giles MRN: [id number] DOB: 09/20/1971  Today's TOC FU Call Status: Today's TOC FU Call Status:: Unsuccessul Call (1st Attempt) Unsuccessful Call (1st Attempt) Date: 04/06/23  Attempted to reach the patient regarding the most recent Inpatient/ED visit.  Follow Up Plan: Additional outreach attempts will be made to reach the patient to complete the Transitions of Care (Post Inpatient/ED visit) call.   Signature Karena Addison, LPN [facility] Nurse Health Advisor Direct Dial [phone]

## 2023-04-07 ENCOUNTER — Ambulatory Visit (HOSPITAL_BASED_OUTPATIENT_CLINIC_OR_DEPARTMENT_OTHER): Payer: PRIVATE HEALTH INSURANCE | Attending: Physical Medicine and Rehabilitation | Admitting: Physical Therapy

## 2023-04-07 DIAGNOSIS — M25561 Pain in right knee: Secondary | ICD-10-CM | POA: Insufficient documentation

## 2023-04-07 DIAGNOSIS — G8929 Other chronic pain: Secondary | ICD-10-CM

## 2023-04-07 DIAGNOSIS — M5459 Other low back pain: Secondary | ICD-10-CM

## 2023-04-07 DIAGNOSIS — M25551 Pain in right hip: Secondary | ICD-10-CM | POA: Diagnosis present

## 2023-04-07 DIAGNOSIS — M5431 Sciatica, right side: Secondary | ICD-10-CM

## 2023-04-07 NOTE — Therapy (Signed)
OUTPATIENT PHYSICAL THERAPY THORACOLUMBAR TREATMENT   Patient Name: Diana Giles MRN: [id number] DOB:08/12/1971, 52 y.o., female Today's Date: 04/07/2023   END OF SESSION:  PT End of Session - 04/07/23 1624     Visit Number 7    Date for PT Re-Evaluation 04/29/23    Authorization Type [employer]    Authorization Time Period 03/04/23 to 04/29/23    PT Start Time 1615    PT Stop Time 1700    PT Time Calculation (min) 45 min    Activity Tolerance Patient tolerated treatment well    Behavior During Therapy WFL for tasks assessed/performed               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    Mental disorder    Rheumatoid arthritis (HCC)    Sinus node dysfunction (HCC)    s/p pacemaker   Thrombocytosis    Vaginal Pap smear, abnormal    Past Surgical History:  Procedure Laterality Date   GASTRIC BYPASS  2010   285lbs at highest weight   HYSTEROSCOPY WITH D & C  08/22/2019   OTHER SURGICAL HISTORY  08/2019   Bilateral fallopian tube removal   PACEMAKER PLACEMENT  08/06/2020   REVISION TOTAL KNEE ARTHROPLASTY Right 06/01/2019   SHOULDER ARTHROSCOPY Left 2018   TUBAL LIGATION Bilateral 08/07/2019   Patient Active Problem List   Diagnosis Date Noted   Generalized abdominal pain 03/25/2023   Change in nail appearance 03/25/2023   Diabetes mellitus screening 03/25/2023   Encounter for lipid screening for cardiovascular disease 03/25/2023   Encounter for hepatitis C screening test for low risk patient 03/25/2023   Encounter for screening mammogram for malignant neoplasm of breast 03/25/2023   COVID-19 11/26/2022   Chronic bilateral low back pain with right-sided sciatica 09/15/2022   Arthritis pain 09/15/2022   Fibromyalgia 09/15/2022   Chronic pain syndrome 09/15/2022    Bilateral sacroiliitis (HCC) 09/15/2022   Postmenopause 08/09/2022   Pacemaker 01/07/2021   Fatigue 11/13/2020   DDD (degenerative disc disease), lumbar 11/13/2020   Avascular necrosis of bone of hip, right (HCC) 11/13/2020   Sinus node dysfunction (HCC) 11/13/2020   Thrombocytosis 11/13/2020   Anxiety 11/13/2020   Attention deficit hyperactivity disorder (ADHD) 11/13/2020    PCP: [name], NP    REFERRING PROVIDER: Elijah Birk, DO  REFERRING DIAG: Chronic bilateral LBP with Rt sided sciatica; fibromyalgia; chronic pain syndrome; bilateral sacroiliitis  Rationale for Evaluation and Treatment: Rehabilitation  THERAPY DIAG:  Other low back pain  Pain in right hip  Sciatica, right side  Chronic pain of right knee  ONSET DATE:  SUBJECTIVE:  SUBJECTIVE STATEMENT:  Pt reports she has been referred to GI dr at end of June.  She did not have kidney stone, but has had digestive issues since traveling to/from [place]. HEP exercises are "going ok.  R side is getting more flexible, but the pain level has been the same".     EVAL: Pt states that she has been dealing with pain for atleast 20 years, but in the past 5 years its gotten worse with falls, injuries, etc. She was started on Gabapentin and feels this has helped her better pinpoint her pain. Pain is sacrum into the Rt buttock and lateral thigh down around the knee. She has numbness in the lateral foot. She has had PT several times in the past. She is interested in aquatic PT because it's the only thing she hasn't done to help the pain.  Pain is often so bad that she can't get out of bed.   PERTINENT HISTORY:  Rt hip avascular necrosis of hip Anxiety  DDD Fibromyalgia  Rheumatoid Arthritis  Lt shoulder arthroplasty Rt total knee replacement  and revision 2020 Pace maker   PAIN:  Are you having pain? Yes: NPRS scale: 5/10 Pain location: sacrum and Rt buttock, Rt LE/lateral knee Pain description: constant  Aggravating factors: sitting, supine, prolonged activity/walking  Relieving factors: avoiding prolonged activity   PRECAUTIONS: None  WEIGHT BEARING RESTRICTIONS: No  FALLS:  Has patient fallen in last 6 months? No  LIVING ENVIRONMENT: Lives with: lives with their family Lives in: House/apartment Stairs: No Has following equipment at home: None  OCCUPATION: Phlebotomist   PLOF: Independent  PATIENT GOALS: decrease pain  NEXT MD VISIT: f/u on MRI soon  OBJECTIVE:   DIAGNOSTIC FINDINGS:  MRI ordered   PATIENT SURVEYS:  Need next visit  SCREENING FOR RED FLAGS: Bowel or bladder incontinence: No Spinal tumors: No Cauda equina syndrome: No Compression fracture: No Abdominal aneurysm: No  COGNITION: Overall cognitive status: Within functional limits for tasks assessed     SENSATION: Pt notes numbness   MUSCLE LENGTH: Hamstrings: Right WNL deg; Left WNL deg Maisie Fus test: Right (+) Rt for quad and hip flexor  POSTURE: No Significant postural limitations  PALPATION: Tenderness Rt lateral gastroc/peroneals, Rt lateral quad/ITB, Rt glutes  LUMBAR ROM:   AROM eval  Flexion   Extension   Right lateral flexion   Left lateral flexion   Right rotation   Left rotation    (Blank rows = not tested)  LOWER EXTREMITY ROM:     Active  Right eval Left eval  Hip flexion    Hip extension    Hip abduction    Hip adduction    Hip internal rotation    Hip external rotation    Knee flexion    Knee extension    Ankle dorsiflexion    Ankle plantarflexion    Ankle inversion    Ankle eversion     (Blank rows = not tested)  LOWER EXTREMITY MMT:    MMT Right eval Left eval  Hip flexion 4 4  Hip extension 3 4  Hip abduction 3 4  Hip adduction    Hip internal rotation    Hip external  rotation    Knee flexion 5 5  Knee extension 4 5  Ankle dorsiflexion    Ankle plantarflexion Heel raise unable Heel raise x6  Ankle inversion    Ankle eversion     (Blank rows = not tested)  LUMBAR SPECIAL TESTS:  Next visit  FUNCTIONAL TESTS:  5 times sit to stand: 18 sec, weight shifted Lt  Timed up and go (TUG): 11 sec  GAIT: Distance walked: 69ft  Assistive device utilized: None Level of assistance: Complete Independence Comments: decreased stance on Rt, decreased step length on Lt, decreased push off on Rt    TODAY'S TREATMENT:   5/30 Pt seen for aquatic therapy today.  Treatment took place in water 3.5-4.75 ft in depth at the [facility] pool. Temp of water was 91.  Pt entered/exited the pool independently via stairs in step-to pattern with bilat rail.  *Tolerates facing hot tub better than lap pool, with RLE on down slope *  * without UE support:  walking forward/backward - side stepping - multiple widths  * holding wall:  straight LE circles CW/CCW x 5 each; heel/toe raises x 12;  relaxed squats with vertical trunk x 10; hip openers/crosses (knee flex with hip abdct or addct) x 10; alternating single leg clams x 5 each  * return to walking forward/ backward with reciprocal arm swing  * straddling yellow noodle with UE support on yellow hand floats:  suspended cycling with LEs; cross country ski in place - feet suspended; gentle suspended jumping jack LEs; return to cycling  * supported supine float with yellow noodle behind back under arms, noodle under LEs,   Pt requires the buoyancy and hydrostatic pressure of water for support, and to offload joints by unweighting joint load by at least 50 % in navel deep water and by at least 75-80% in chest to neck deep water.  Viscosity of the water is needed for resistance of strengthening. Water current perturbations provides challenge to standing balance requiring increased core activation.   Date: 5/24  TherEx:  LTR  x30  Hooklying bridging with glute squeeze prior to lift off, 2x10 PPT x20  Abdominal bracing with 5 sec hold, x10 Hip IR x 20, bilat Figure 4 stretch 4x30 sec on R LE ITB stretch in supine position with strap for increased ability to perform, 30 sec holds   Trigger Point Dry Needling (TDN), unbilled, 6 minute  Education performed with patient regarding potential benefit of TDN. Reviewed precautions and risks with patient. Reviewed special precautions/risks over lung fields which include pneumothorax. Reviewed signs and symptoms of pneumothorax and advised pt to go to ER immediately if these symptoms develop advise them of dry needling treatment. Extensive time spent with pt to ensure full understanding of TDN risks. Pt provided verbal consent to treatment. TDN performed to the R and L TFL with 0.30 x 70 mm single needle placements with local twitch response (LTR).  TDN performed to R glute and piriformis of the R side with 0.30 x 100 single needle placements with local twitch response (LTR). Pistoning technique utilized. Improved pain-free motion following intervention.       PATIENT EDUCATION:  Education details: aquatic therapy  Person educated: Patient Education method: Chief Technology Officer Education comprehension: verbalized understanding and returned demonstration  HOME EXERCISE PROGRAM: Access Code: [email or web address] URL: [email or web address] Date: 03/04/2023 Prepared by: [facility] - [facility]  Exercises - Modified Maisie Fus Stretch  - 2 x daily - 7 x weekly - 2 sets - 20 seconds hold - Heel Raises with Counter Support with Brace On  - 3 x daily - 7 x weekly - 10 reps  ASSESSMENT:  CLINICAL IMPRESSION:  Pt is confident in aquatic setting and able to take direction from therapist on deck, without need for  UE support on floatation device.  Good tolerance for aquatic exercises performed while submerged 70%.  She reported elimination  of pain when suspended in deeper water with support of noodle, while cycling.  Educated pt on what to expect after session (exercise soreness and fatigue). Pt to look into area pools where she will have access in fall/winter.  Plan to create an aquatic HEP as she progresses.  Pt will continue to benefit from skilled therapy to address remaining deficits in order to improve overall QoL and return to PLOF.         OBJECTIVE IMPAIRMENTS: Abnormal gait, decreased activity tolerance, decreased balance, decreased endurance, decreased strength, impaired flexibility, and pain.   ACTIVITY LIMITATIONS: lifting, bending, sitting, standing, squatting, stairs, locomotion level, and caring for others  PARTICIPATION LIMITATIONS: cleaning, laundry, interpersonal relationship, shopping, community activity, occupation, and yard work  PERSONAL FACTORS: Age, Fitness, Past/current experiences, Time since onset of injury/illness/exacerbation, and 3+ comorbidities: Rt hip avascular necrosis, fibromyalgia, Rt TKA with revision  are also affecting patient's functional outcome.   REHAB POTENTIAL: Good  CLINICAL DECISION MAKING: Evolving/moderate complexity  EVALUATION COMPLEXITY: Moderate   GOALS: Goals reviewed with patient? Yes  SHORT TERM GOALS: Target date: 03/11/23  Pt will be independent with her initial HEP to improve LE strength and flexibility. Baseline: Goal status: INITIAL   LONG TERM GOALS: Target date: 04/29/23  Pt will report atleast 40% improvement in her activity participation from the start of PT. Baseline:  Goal status: INITIAL  2.  Pt will have improved hip strength to atleast 4/5 MMT which will increase efficiency with daily activity. Baseline:  Goal status: INITIAL  3.  Pt will have improved Rt hip flexibility evident by negative thomas test.  Baseline:  Goal status: INITIAL  4.  Pt will have improved gastroc strength bilaterally, able to complete atleast 15 consecutive single  leg heel raises. Baseline:  Goal status: INITIAL  5.  Pt will be independent with an advanced HEP to increase  Baseline:  Goal status: INITIAL   PLAN:  PT FREQUENCY: 2x/week  PT DURATION: 8 weeks  PLANNED INTERVENTIONS: Therapeutic exercises, Therapeutic activity, Neuromuscular re-education, Balance training, Gait training, Patient/Family education, Self Care, Joint mobilization, Aquatic Therapy, Dry Needling, Moist heat, Taping, Manual therapy, and Re-evaluation.  PLAN FOR NEXT SESSION: see response to DN and introduce more core stability exercises. Assess response to aquatic session.   Mayer Camel, PTA 04/07/23 5:28 PM [facility] [address] [place], [place], [address] Phone: [phone]   Fax:  [phone]

## 2023-04-07 NOTE — Transitions of Care (Post Inpatient/ED Visit) (Signed)
04/07/2023  Name: Diana Giles MRN: [id number] DOB: 09/18/1971  Today's TOC FU Call Status: Today's TOC FU Call Status:: Successful TOC FU Call Competed Unsuccessful Call (1st Attempt) Date: 04/06/23 [facility] FU Call Complete Date: 04/07/23  Transition Care Management Follow-up Telephone Call Date of Discharge: 04/05/23 Discharge Facility: [facility]) Type of Discharge: Emergency Department Reason for ED Visit: Other: (abd pain) How have you been since you were released from the hospital?: Better Any questions or concerns?: No  Items Reviewed: Did you receive and understand the discharge instructions provided?: Yes Medications obtained,verified, and reconciled?: Yes (Medications Reviewed) Any new allergies since your discharge?: No Dietary orders reviewed?: Yes Do you have support at home?: Yes People in Home: spouse  Medications Reviewed Today: Medications Reviewed Today     Reviewed by Karena Addison, LPN (Licensed Practical Nurse) on 04/07/23 at 1347  Med List Status: <None>   Medication Order Taking? Sig Documenting Provider Last Dose Status Informant  acetaminophen (TYLENOL) 500 MG tablet [id number] Yes Take 500 mg by mouth every 6 (six) hours as needed. [provider] Taking Active   albuterol (VENTOLIN HFA) 108 (90 Base) MCG/ACT inhaler [id number] Yes Inhale 2 puffs into the lungs every 6 (six) hours as needed for wheezing or shortness of breath. [name], NP Taking Active   amphetamine-dextroamphetamine (ADDERALL XR) 20 MG 24 hr capsule [id number] Yes Take 20 mg by mouth daily as needed. [provider] Taking Active            Med Note Joelene Millin, Norwood Levo   Wed Jan 13, 2022  3:36 PM)    Cholecalciferol (VITAMIN D3) 125 MCG (5000 UT) CAPS [id number] Yes Take 2 capsules by mouth in the morning and at bedtime. 2 in AM & 2 in PM. (10,000iu /2xday). [provider] Taking Active            Med Note Maple Hudson, Princess Bruins Sep 27, 2022  2:05 PM) Vit D takes every other day.   clonazePAM (KLONOPIN) 1 MG tablet [id number] Yes Take 1 tablet (1 mg total) by mouth 2 (two) times daily as needed for anxiety. [name], NP Taking Active   cyclobenzaprine (FLEXERIL) 5 MG tablet [id number] Yes Take ONE tab PO BID/TID PRN [provider] Taking Active   doxycycline (VIBRAMYCIN) 100 MG capsule [id number] Yes  [provider] Taking Active   DULoxetine (CYMBALTA) 60 MG capsule [id number] Yes Take 1 capsule (60 mg total) by mouth 2 (two) times daily. Keep follow-up appt for future refills [name], NP Taking Active   EPINEPHrine 0.3 mg/0.3 mL IJ SOAJ injection [id number] Yes Inject 0.3 mg into the muscle as needed for anaphylaxis. [name], NP Taking Active   gabapentin (NEURONTIN) 300 MG capsule [id number] Yes TAKE 1 CAPSULE BY MOUTH THREE TIMES A DAY Elijah Birk C, DO Taking Active   hydrOXYzine (VISTARIL) 25 MG capsule [id number] Yes Take 25 mg by mouth 3 (three) times daily as needed. [provider] Taking Active   ibuprofen (ADVIL) 200 MG tablet [id number] Yes Take 400 mg by mouth every 6 (six) hours as needed. [provider] Taking Active   meloxicam (MOBIC) 15 MG tablet [id number] Yes Take 15 mg by mouth daily. [provider] Taking Active   omeprazole (PRILOSEC) 20 MG capsule [id number] Yes Take 20 mg by mouth daily. [provider] Taking Active   ondansetron (ZOFRAN) 4 MG tablet [id number] Yes Take 1 tablet (4  mg total) by mouth daily as needed for nausea or vomiting. Pilar Jarvis, MD Taking Active   progesterone (PROMETRIUM) 200 MG capsule [id number] Yes Take 1 daily for 21 days every month Adline Potter, NP Taking Active   valACYclovir (VALTREX) 500 MG tablet [id number] Yes Take 1 tablet (500 mg total) by mouth 2 (two) times daily.  Patient taking differently: Take 500 mg by mouth as needed.   Lazaro Arms, MD Taking Active   vitamin B-12  (CYANOCOBALAMIN) 500 MCG tablet [id number] Yes Take 500 mcg by mouth daily. [provider] Taking Active             Home Care and Equipment/Supplies: Were Home Health Services Ordered?: NA Any new equipment or medical supplies ordered?: NA  Functional Questionnaire: Do you need assistance with bathing/showering or dressing?: No Do you need assistance with meal preparation?: No Do you need assistance with eating?: No Do you have difficulty maintaining continence: No Do you need assistance with getting out of bed/getting out of a chair/moving?: No Do you have difficulty managing or taking your medications?: No  Follow up appointments reviewed: PCP Follow-up appointment confirmed?: Yes Date of PCP follow-up appointment?: 04/15/23 Follow-up Provider: [name] Specialist [facility] Follow-up appointment confirmed?: No Reason Specialist Follow-Up Not Confirmed: Patient has Specialist Provider Number and will Call for Appointment Do you need transportation to your follow-up appointment?: No Do you understand care options if your condition(s) worsen?: Yes-patient verbalized understanding    SIGNATURE Karena Addison, LPN [facility] Nurse Health Advisor Direct Dial [phone]

## 2023-04-08 ENCOUNTER — Ambulatory Visit: Payer: BLUE CROSS/BLUE SHIELD | Admitting: Podiatry

## 2023-04-08 ENCOUNTER — Ambulatory Visit: Payer: PRIVATE HEALTH INSURANCE

## 2023-04-08 ENCOUNTER — Other Ambulatory Visit: Payer: Self-pay | Admitting: Nurse Practitioner

## 2023-04-08 DIAGNOSIS — G8929 Other chronic pain: Secondary | ICD-10-CM

## 2023-04-08 DIAGNOSIS — F419 Anxiety disorder, unspecified: Secondary | ICD-10-CM

## 2023-04-08 DIAGNOSIS — M5431 Sciatica, right side: Secondary | ICD-10-CM

## 2023-04-08 DIAGNOSIS — M5459 Other low back pain: Secondary | ICD-10-CM | POA: Diagnosis not present

## 2023-04-08 DIAGNOSIS — M25551 Pain in right hip: Secondary | ICD-10-CM

## 2023-04-08 NOTE — Therapy (Signed)
OUTPATIENT PHYSICAL THERAPY THORACOLUMBAR TREATMENT   Patient Name: Diana Giles MRN: [id number] DOB:[birth year], 52 y.o., female Today's Date: 04/08/2023   END OF SESSION:  PT End of Session - 04/08/23 0903     Visit Number 8    Date for PT Re-Evaluation 04/29/23    Authorization Type [place]    Authorization Time Period 03/04/23 to 04/29/23    PT Start Time 0903    PT Stop Time 0945    PT Time Calculation (min) 42 min    Activity Tolerance Patient tolerated treatment well    Behavior During Therapy WFL for tasks assessed/performed               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    Mental disorder    Rheumatoid arthritis (HCC)    Sinus node dysfunction (HCC)    s/p pacemaker   Thrombocytosis    Vaginal Pap smear, abnormal    Past Surgical History:  Procedure Laterality Date   GASTRIC BYPASS  2010   285lbs at highest weight   HYSTEROSCOPY WITH D & C  08/22/2019   OTHER SURGICAL HISTORY  08/2019   Bilateral fallopian tube removal   PACEMAKER PLACEMENT  08/06/2020   REVISION TOTAL KNEE ARTHROPLASTY Right 06/01/2019   SHOULDER ARTHROSCOPY Left 2018   TUBAL LIGATION Bilateral 08/07/2019   Patient Active Problem List   Diagnosis Date Noted   Generalized abdominal pain 03/25/2023   Change in nail appearance 03/25/2023   Diabetes mellitus screening 03/25/2023   Encounter for lipid screening for cardiovascular disease 03/25/2023   Encounter for hepatitis C screening test for low risk patient 03/25/2023   Encounter for screening mammogram for malignant neoplasm of breast 03/25/2023   COVID-19 11/26/2022   Chronic bilateral low back pain with right-sided sciatica 09/15/2022   Arthritis pain 09/15/2022   Fibromyalgia 09/15/2022   Chronic pain syndrome 09/15/2022    Bilateral sacroiliitis (HCC) 09/15/2022   Postmenopause 08/09/2022   Pacemaker 01/07/2021   Fatigue 11/13/2020   DDD (degenerative disc disease), lumbar 11/13/2020   Avascular necrosis of bone of hip, right (HCC) 11/13/2020   Sinus node dysfunction (HCC) 11/13/2020   Thrombocytosis 11/13/2020   Anxiety 11/13/2020   Attention deficit hyperactivity disorder (ADHD) 11/13/2020    PCP: [name], NP    REFERRING PROVIDER: Elijah Birk, DO  REFERRING DIAG: Chronic bilateral LBP with Rt sided sciatica; fibromyalgia; chronic pain syndrome; bilateral sacroiliitis  Rationale for Evaluation and Treatment: Rehabilitation  THERAPY DIAG:  Other low back pain  Pain in right hip  Sciatica, right side  Chronic pain of right knee  ONSET DATE:  SUBJECTIVE:  SUBJECTIVE STATEMENT:  Pt reports she performed aquatic therapy yesterday and is a little sore from performing her first treatment session.  Pt also notes her [place] trip wasn't the best because she had to go to the ED and follow up with gastro.    EVAL: Pt states that she has been dealing with pain for atleast 20 years, but in the past 5 years its gotten worse with falls, injuries, etc. She was started on Gabapentin and feels this has helped her better pinpoint her pain. Pain is sacrum into the Rt buttock and lateral thigh down around the knee. She has numbness in the lateral foot. She has had PT several times in the past. She is interested in aquatic PT because it's the only thing she hasn't done to help the pain.  Pain is often so bad that she can't get out of bed.   PERTINENT HISTORY:  Rt hip avascular necrosis of hip Anxiety  DDD Fibromyalgia  Rheumatoid Arthritis  Lt shoulder arthroplasty Rt total knee replacement and revision 2020 Pace maker    PAIN:  Are you having pain? Yes: NPRS scale: 5/10 Pain location: sacrum and Rt buttock, Rt LE/lateral knee Pain description: constant  Aggravating factors: sitting, supine, prolonged activity/walking  Relieving factors: avoiding prolonged activity   PRECAUTIONS: None  WEIGHT BEARING RESTRICTIONS: No  FALLS:  Has patient fallen in last 6 months? No  LIVING ENVIRONMENT: Lives with: lives with their family Lives in: House/apartment Stairs: No Has following equipment at home: None  OCCUPATION: Phlebotomist   PLOF: Independent  PATIENT GOALS: decrease pain  NEXT MD VISIT: f/u on MRI soon  OBJECTIVE:   DIAGNOSTIC FINDINGS:  MRI ordered   PATIENT SURVEYS:  Need next visit  SCREENING FOR RED FLAGS: Bowel or bladder incontinence: No Spinal tumors: No Cauda equina syndrome: No Compression fracture: No Abdominal aneurysm: No  COGNITION: Overall cognitive status: Within functional limits for tasks assessed     SENSATION: Pt notes numbness   MUSCLE LENGTH: Hamstrings: Right WNL deg; Left WNL deg Maisie Fus test: Right (+) Rt for quad and hip flexor  POSTURE: No Significant postural limitations  PALPATION: Tenderness Rt lateral gastroc/peroneals, Rt lateral quad/ITB, Rt glutes  LUMBAR ROM:   AROM eval  Flexion   Extension   Right lateral flexion   Left lateral flexion   Right rotation   Left rotation    (Blank rows = not tested)  LOWER EXTREMITY ROM:     Active  Right eval Left eval  Hip flexion    Hip extension    Hip abduction    Hip adduction    Hip internal rotation    Hip external rotation    Knee flexion    Knee extension    Ankle dorsiflexion    Ankle plantarflexion    Ankle inversion    Ankle eversion     (Blank rows = not tested)  LOWER EXTREMITY MMT:    MMT Right eval Left eval  Hip flexion 4 4  Hip extension 3 4  Hip abduction 3 4  Hip adduction    Hip internal rotation    Hip external rotation    Knee flexion 5 5  Knee  extension 4 5  Ankle dorsiflexion    Ankle plantarflexion Heel raise unable Heel raise x6  Ankle inversion    Ankle eversion     (Blank rows = not tested)  LUMBAR SPECIAL TESTS:  Next visit  FUNCTIONAL TESTS:  5 times sit to stand: 18  sec, weight shifted Lt  Timed up and go (TUG): 11 sec  GAIT: Distance walked: 76ft  Assistive device utilized: None Level of assistance: Complete Independence Comments: decreased stance on Rt, decreased step length on Lt, decreased push off on Rt    TODAY'S TREATMENT:   Date: 04/08/23  TherEx:  Seated forward/lateral ball roll-outs with blue physioball, 2x10 each direction Seated core activation while marching on clear physioball, 2x10 Hooklying LTR x30  Hooklying bridging with glute squeeze prior to lift off, 2x10 Hooklying bridging with hip adduction into green physioball, 2x10 Hooklying PPT x20  Hooklying hip IR x 20, bilat Hooklying Figure 4 stretch 4x30 sec on R LE Supine ITB stretch in supine position with therapist applied overpressure for maximal stretch, 30 sec holds    Trigger Point Dry Needling (TDN), unbilled, 6 minute  Education performed with patient regarding potential benefit of TDN. Reviewed precautions and risks with patient. Reviewed special precautions/risks over lung fields which include pneumothorax. Reviewed signs and symptoms of pneumothorax and advised pt to go to ER immediately if these symptoms develop advise them of dry needling treatment. Extensive time spent with pt to ensure full understanding of TDN risks. Pt provided verbal consent to treatment.  TDN performed to R glute and piriformis of the R side with 0.30 x 100 single needle placements with local twitch response (LTR). Pistoning technique utilized. Improved pain-free motion following intervention.       PATIENT EDUCATION:  Education details: eval findings/POC; implemented HEP; benefits of aquatic PT; chronic pain response Person educated:  Patient Education method: Explanation and Handouts Education comprehension: verbalized understanding and returned demonstration  HOME EXERCISE PROGRAM: Access Code: [email or web address] URL: [email or web address] Date: 03/04/2023 Prepared by: [facility] - Outpatient Rehab - [facility]  Exercises - Modified Maisie Fus Stretch  - 2 x daily - 7 x weekly - 2 sets - 20 seconds hold - Heel Raises with Counter Support with Brace On  - 3 x daily - 7 x weekly - 10 reps  ASSESSMENT:  CLINICAL IMPRESSION:  Pt continues to improve and has noticeable reduction in tissue restriction with dry needling of the gluteal region and piriformis of the R side.  Pt noticed that it is feeling looser and she is able to move better.  Pt advised to continue with strengthening program in order to improve hip and core strength moving forward.  Will continue to update HEP as necessary.   Pt will continue to benefit from skilled therapy to address remaining deficits in order to improve overall QoL and return to PLOF.         OBJECTIVE IMPAIRMENTS: Abnormal gait, decreased activity tolerance, decreased balance, decreased endurance, decreased strength, impaired flexibility, and pain.   ACTIVITY LIMITATIONS: lifting, bending, sitting, standing, squatting, stairs, locomotion level, and caring for others  PARTICIPATION LIMITATIONS: cleaning, laundry, interpersonal relationship, shopping, community activity, occupation, and yard work  PERSONAL FACTORS: Age, Fitness, Past/current experiences, Time since onset of injury/illness/exacerbation, and 3+ comorbidities: Rt hip avascular necrosis, fibromyalgia, Rt TKA with revision  are also affecting patient's functional outcome.   REHAB POTENTIAL: Good  CLINICAL DECISION MAKING: Evolving/moderate complexity  EVALUATION COMPLEXITY: Moderate   GOALS: Goals reviewed with patient? Yes  SHORT TERM GOALS: Target date: 03/11/23  Pt will be independent with her  initial HEP to improve LE strength and flexibility. Baseline: Goal status: INITIAL   LONG TERM GOALS: Target date: 04/29/23  Pt will report atleast 40% improvement in her activity participation from the start of PT. Baseline:  Goal status: INITIAL  2.  Pt will have improved hip strength to atleast 4/5 MMT which will increase efficiency with daily activity. Baseline:  Goal status: INITIAL  3.  Pt will have improved Rt hip flexibility evident by negative thomas test.  Baseline:  Goal status: INITIAL  4.  Pt will have improved gastroc strength bilaterally, able to complete atleast 15 consecutive single leg heel raises. Baseline:  Goal status: INITIAL  5.  Pt will be independent with an advanced HEP to increase  Baseline:  Goal status: INITIAL   PLAN:  PT FREQUENCY: 2x/week  PT DURATION: 8 weeks  PLANNED INTERVENTIONS: Therapeutic exercises, Therapeutic activity, Neuromuscular re-education, Balance training, Gait training, Patient/Family education, Self Care, Joint mobilization, Aquatic Therapy, Dry Needling, Moist heat, Taping, Manual therapy, and Re-evaluation.  PLAN FOR NEXT SESSION: introduce more core stability exercises and add to HEP.   Nolon Bussing, PT, DPT Physical Therapist - [facility]  04/08/23, 10:21 AM

## 2023-04-12 ENCOUNTER — Encounter (HOSPITAL_BASED_OUTPATIENT_CLINIC_OR_DEPARTMENT_OTHER): Payer: Self-pay | Admitting: Physical Therapy

## 2023-04-12 ENCOUNTER — Ambulatory Visit (HOSPITAL_BASED_OUTPATIENT_CLINIC_OR_DEPARTMENT_OTHER): Payer: PRIVATE HEALTH INSURANCE | Attending: Physical Medicine and Rehabilitation | Admitting: Physical Therapy

## 2023-04-12 DIAGNOSIS — M5459 Other low back pain: Secondary | ICD-10-CM | POA: Diagnosis present

## 2023-04-12 DIAGNOSIS — M5431 Sciatica, right side: Secondary | ICD-10-CM | POA: Diagnosis present

## 2023-04-12 DIAGNOSIS — G8929 Other chronic pain: Secondary | ICD-10-CM | POA: Diagnosis present

## 2023-04-12 DIAGNOSIS — M25561 Pain in right knee: Secondary | ICD-10-CM | POA: Diagnosis present

## 2023-04-12 DIAGNOSIS — M25551 Pain in right hip: Secondary | ICD-10-CM | POA: Diagnosis present

## 2023-04-12 NOTE — Therapy (Signed)
OUTPATIENT PHYSICAL THERAPY THORACOLUMBAR TREATMENT   Patient Name: Diana Giles MRN: [id number] DOB:[birth year], 52 y.o., female Today's Date: 04/12/2023   END OF SESSION:  PT End of Session - 04/12/23 1654     Visit Number 9    Date for PT Re-Evaluation 04/29/23    Authorization [place]    Authorization Time Period 03/04/23 to 04/29/23    PT Start Time 1700    PT Stop Time 1740    PT Time Calculation (min) 40 min    Behavior During Therapy WFL for tasks assessed/performed               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    Mental disorder    Rheumatoid arthritis (HCC)    Sinus node dysfunction (HCC)    s/p pacemaker   Thrombocytosis    Vaginal Pap smear, abnormal    Past Surgical History:  Procedure Laterality Date   GASTRIC BYPASS  2010   285lbs at highest weight   HYSTEROSCOPY WITH D & C  08/22/2019   OTHER SURGICAL HISTORY  08/2019   Bilateral fallopian tube removal   PACEMAKER PLACEMENT  08/06/2020   REVISION TOTAL KNEE ARTHROPLASTY Right 06/01/2019   SHOULDER ARTHROSCOPY Left 2018   TUBAL LIGATION Bilateral 08/07/2019   Patient Active Problem List   Diagnosis Date Noted   Generalized abdominal pain 03/25/2023   Change in nail appearance 03/25/2023   Diabetes mellitus screening 03/25/2023   Encounter for lipid screening for cardiovascular disease 03/25/2023   Encounter for hepatitis C screening test for low risk patient 03/25/2023   Encounter for screening mammogram for malignant neoplasm of breast 03/25/2023   COVID-19 11/26/2022   Chronic bilateral low back pain with right-sided sciatica 09/15/2022   Arthritis pain 09/15/2022   Fibromyalgia 09/15/2022   Chronic pain syndrome 09/15/2022   Bilateral sacroiliitis (HCC) 09/15/2022    Postmenopause 08/09/2022   Pacemaker 01/07/2021   Fatigue 11/13/2020   DDD (degenerative disc disease), lumbar 11/13/2020   Avascular necrosis of bone of hip, right (HCC) 11/13/2020   Sinus node dysfunction (HCC) 11/13/2020   Thrombocytosis 11/13/2020   Anxiety 11/13/2020   Attention deficit hyperactivity disorder (ADHD) 11/13/2020    PCP: [name], NP    REFERRING PROVIDER: Elijah Birk, DO  REFERRING DIAG: Chronic bilateral LBP with Rt sided sciatica; fibromyalgia; chronic pain syndrome; bilateral sacroiliitis  Rationale for Evaluation and Treatment: Rehabilitation  THERAPY DIAG:  Other low back pain  Pain in right hip  Sciatica, right side  Chronic pain of right knee  ONSET DATE:  SUBJECTIVE:  SUBJECTIVE STATEMENT:  "You gave me a heck of a workout".   Pt reports she has bought some noodles to use when she goes to her niece's pool.  Pt reports she was achy after DN, and "it didn't seem as beneficial as the last time".      EVAL: Pt states that she has been dealing with pain for atleast 20 years, but in the past 5 years its gotten worse with falls, injuries, etc. She was started on Gabapentin and feels this has helped her better pinpoint her pain. Pain is sacrum into the Rt buttock and lateral thigh down around the knee. She has numbness in the lateral foot. She has had PT several times in the past. She is interested in aquatic PT because it's the only thing she hasn't done to help the pain.  Pain is often so bad that she can't get out of bed.   PERTINENT HISTORY:  Rt hip avascular necrosis of hip Anxiety  DDD Fibromyalgia  Rheumatoid Arthritis  Lt shoulder arthroplasty Rt total knee replacement and revision 2020 Pace maker   PAIN:  Are you having pain? Yes: NPRS scale:  5/10 Pain location: sacrum and Rt buttock, Rt LE/lateral knee Pain description: constant  Aggravating factors: sitting, supine, prolonged activity/walking  Relieving factors: avoiding prolonged activity   PRECAUTIONS: None  WEIGHT BEARING RESTRICTIONS: No  FALLS:  Has patient fallen in last 6 months? No  LIVING ENVIRONMENT: Lives with: lives with their family Lives in: House/apartment Stairs: No Has following equipment at home: None  OCCUPATION: Phlebotomist   PLOF: Independent  PATIENT GOALS: decrease pain  NEXT MD VISIT: f/u on MRI soon  OBJECTIVE:   DIAGNOSTIC FINDINGS:  MRI ordered   PATIENT SURVEYS:  Need next visit  SCREENING FOR RED FLAGS: Bowel or bladder incontinence: No Spinal tumors: No Cauda equina syndrome: No Compression fracture: No Abdominal aneurysm: No  COGNITION: Overall cognitive status: Within functional limits for tasks assessed     SENSATION: Pt notes numbness   MUSCLE LENGTH: Hamstrings: Right WNL deg; Left WNL deg Maisie Fus test: Right (+) Rt for quad and hip flexor  POSTURE: No Significant postural limitations  PALPATION: Tenderness Rt lateral gastroc/peroneals, Rt lateral quad/ITB, Rt glutes  LUMBAR ROM:   AROM eval  Flexion   Extension   Right lateral flexion   Left lateral flexion   Right rotation   Left rotation    (Blank rows = not tested)  LOWER EXTREMITY ROM:     Active  Right eval Left eval  Hip flexion    Hip extension    Hip abduction    Hip adduction    Hip internal rotation    Hip external rotation    Knee flexion    Knee extension    Ankle dorsiflexion    Ankle plantarflexion    Ankle inversion    Ankle eversion     (Blank rows = not tested)  LOWER EXTREMITY MMT:    MMT Right eval Left eval  Hip flexion 4 4  Hip extension 3 4  Hip abduction 3 4  Hip adduction    Hip internal rotation    Hip external rotation    Knee flexion 5 5  Knee extension 4 5  Ankle dorsiflexion    Ankle  plantarflexion Heel raise unable Heel raise x6  Ankle inversion    Ankle eversion     (Blank rows = not tested)  LUMBAR SPECIAL TESTS:  Next visit  FUNCTIONAL TESTS:  5  times sit to stand: 18 sec, weight shifted Lt  Timed up and go (TUG): 11 sec  GAIT: Distance walked: 89ft  Assistive device utilized: None Level of assistance: Complete Independence Comments: decreased stance on Rt, decreased step length on Lt, decreased push off on Rt   TODAY'S TREATMENT:   04/12/23 Pt seen for aquatic therapy today.  Treatment took place in water 3.5-4.75 ft in depth at the [facility] pool. Temp of water was 91.  Pt entered/exited the pool independently via stairs in step-to pattern with bilat rail.  *Tolerates facing hot tub better than lap pool, with RLE on down slope *   * straddling yellow noodle without UE support:  vertical suspension for decompression of spine x 3 min;   suspended cycling with LEs; cross country ski in place - feet suspended; gentle suspended jumping jack LEs; return to cycling  * without UE support:  walking forward/backward with reciprocal arm swing - side stepping - multiple widths cues for increased step height and to slow speed * holding wall: alternating single leg clams x 5 each , straight LE circles CW/CCW x 5 each;  relaxed squats with vertical trunk x 10;  * return to suspended on noodle with cycling and breast stroke arms  * supported supine float with yellow noodle behind back under arms, noodle under LEs,    Pt requires the buoyancy and hydrostatic pressure of water for support, and to offload joints by unweighting joint load by at least 50 % in navel deep water and by at least 75-80% in chest to neck deep water.  Viscosity of the water is needed for resistance of strengthening. Water current perturbations provides challenge to standing balance requiring increased core activation    PATIENT EDUCATION:  Education details: aquatic therapy exercise  rationale and modifications Person educated: Patient Education method: Chief Technology Officer Education comprehension: verbalized understanding and returned demonstration  HOME EXERCISE PROGRAM: Access Code: [email or web address] URL: [email or web address] Date: 03/04/2023 Prepared by: [facility] - Outpatient Rehab - [facility]  Exercises - Modified Maisie Fus Stretch  - 2 x daily - 7 x weekly - 2 sets - 20 seconds hold - Heel Raises with Counter Support with Brace On  - 3 x daily - 7 x weekly - 10 reps  ASSESSMENT:  CLINICAL IMPRESSION: Started pt in deeper water with vertical decompression and cycling with positive response. Pt reported gradual reduction of pain to minimal.  Pt able to complete exercises with feet on ground in water afterwards with less pain than last session.  Alternated LE exercises in SLS so as not to increase LBP with too much time in stance.   Encouraged pt to visit her niece's pool and walk in water/ ride noodle as performed at session.  Will plan to create HEP in future session.        OBJECTIVE IMPAIRMENTS: Abnormal gait, decreased activity tolerance, decreased balance, decreased endurance, decreased strength, impaired flexibility, and pain.   ACTIVITY LIMITATIONS: lifting, bending, sitting, standing, squatting, stairs, locomotion level, and caring for others  PARTICIPATION LIMITATIONS: cleaning, laundry, interpersonal relationship, shopping, community activity, occupation, and yard work  PERSONAL FACTORS: Age, Fitness, Past/current experiences, Time since onset of injury/illness/exacerbation, and 3+ comorbidities: Rt hip avascular necrosis, fibromyalgia, Rt TKA with revision  are also affecting patient's functional outcome.   REHAB POTENTIAL: Good  CLINICAL DECISION MAKING: Evolving/moderate complexity  EVALUATION COMPLEXITY: Moderate   GOALS: Goals reviewed with patient? Yes  SHORT TERM GOALS: Target date: 03/11/23  Pt will  be  independent with her initial HEP to improve LE strength and flexibility. Baseline: Goal status: INITIAL   LONG TERM GOALS: Target date: 04/29/23  Pt will report atleast 40% improvement in her activity participation from the start of PT. Baseline:  Goal status: INITIAL  2.  Pt will have improved hip strength to atleast 4/5 MMT which will increase efficiency with daily activity. Baseline:  Goal status: INITIAL  3.  Pt will have improved Rt hip flexibility evident by negative thomas test.  Baseline:  Goal status: INITIAL  4.  Pt will have improved gastroc strength bilaterally, able to complete atleast 15 consecutive single leg heel raises. Baseline:  Goal status: INITIAL  5.  Pt will be independent with an advanced HEP to increase  Baseline:  Goal status: INITIAL   PLAN:  PT FREQUENCY: 2x/week  PT DURATION: 8 weeks  PLANNED INTERVENTIONS: Therapeutic exercises, Therapeutic activity, Neuromuscular re-education, Balance training, Gait training, Patient/Family education, Self Care, Joint mobilization, Aquatic Therapy, Dry Needling, Moist heat, Taping, Manual therapy, and Re-evaluation.  PLAN FOR NEXT SESSION: introduce more core stability exercises and add to HEP.  Mayer Camel, PTA 04/12/23 5:50 PM [facility] [address] [place], [place], [address] Phone: [phone]   Fax:  [phone]

## 2023-04-13 ENCOUNTER — Encounter: Payer: No Typology Code available for payment source | Admitting: Cardiology

## 2023-04-14 NOTE — Progress Notes (Signed)
Cardiology Office Note Date:  04/15/2023  Patient ID:  Diana, Giles [birth year], MRN [id number] PCP:  [name], NP  Cardiologist:  None Electrophysiologist: [name], MD    Chief Complaint: 1 year pacemaker follow-up, past due  History of Present Illness: Diana Giles is a 52 y.o. female with PMH notable for SND s/p PPM; seen today for [name], MD for routine electrophysiology followup.  PPM placed in [place] prior to moving to the area.  She saw Dr. [name] 01/2022, was doing well without complaints open pacemaker.   Since last being seen in our clinic the patient reports doing well.  She recently went to the emergency department with gastrointestinal, has appointment with PCP later this afternoon for further workup. 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  She also has anxiety takes as needed for this. She has been referred for a sleep study, but has not been contacted about completing this.  She has daytime sleepiness, states that if she sits still for any length of time she will fall asleep.  Also describes episodes in the middle of the night where she wakes abruptly has to sit up and take deep breaths .  she denies chest pain, dyspnea, PND, orthopnea, nausea, vomiting, dizziness, syncope, edema, weight gain, or early satiety.     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    Mental disorder    Rheumatoid arthritis (HCC)    Sinus node dysfunction (HCC)    s/p pacemaker   Thrombocytosis    Vaginal Pap smear, abnormal     Past Surgical History:  Procedure Laterality Date   GASTRIC BYPASS  2010   285lbs at highest weight    HYSTEROSCOPY WITH D & C  08/22/2019   OTHER SURGICAL HISTORY  08/2019   Bilateral fallopian tube removal   PACEMAKER PLACEMENT  08/06/2020   REVISION TOTAL KNEE ARTHROPLASTY Right 06/01/2019   SHOULDER ARTHROSCOPY Left 2018   TUBAL LIGATION Bilateral 08/07/2019    Current Outpatient Medications  Medication Instructions   acetaminophen (TYLENOL) 500 mg, Oral, Every 6 hours PRN   albuterol (VENTOLIN HFA) 108 (90 Base) MCG/ACT inhaler 2 puffs, Inhalation, Every 6 hours PRN   amphetamine-dextroamphetamine (ADDERALL XR) 20 MG 24 hr capsule 20 mg, Oral, Daily PRN   Cholecalciferol (VITAMIN D3) 125 MCG (5000 UT) CAPS 2 capsules, Oral, 2 times daily, 2 in AM & 2 in PM. (10,000iu /2xday).   clonazePAM (KLONOPIN) 1 mg, Oral, 2 times daily PRN   cyanocobalamin (VITAMIN B12) 500 mcg, Oral, Daily   cyclobenzaprine (FLEXERIL) 5 MG tablet Take ONE tab PO BID/TID PRN   DULoxetine (CYMBALTA) 60 mg, Oral, 2 times daily   EPINEPHrine (EPI-PEN) 0.3 mg, Intramuscular, As needed   gabapentin (NEURONTIN) 300 MG capsule TAKE 1 CAPSULE BY MOUTH THREE TIMES A DAY   hydrOXYzine (VISTARIL) 25 mg, Oral, 3 times daily PRN   ibuprofen (ADVIL) 400 mg, Oral, Every 6 hours PRN   meloxicam (MOBIC) 15 mg, Oral, Daily   omeprazole (PRILOSEC) 20 mg, Oral, Daily   ondansetron (ZOFRAN) 4 mg, Oral, Daily PRN  progesterone (PROMETRIUM) 200 MG capsule Take 1 daily for 21 days every month   valACYclovir (VALTREX) 500 mg, Oral, 2 times daily    Social History:  The patient  reports that she has never smoked. She has been exposed to tobacco smoke. She has never used smokeless tobacco. She reports current alcohol use. She reports that she does not use drugs.   Family History:  The patient's family history includes Anxiety disorder in her brother; Asthma in her brother, brother, and son; COPD in her sister; Colon cancer in her cousin; Diabetes in her brother and father; Heart disease in her father; Hypertension in her brother and  mother; Hyperthyroidism in her sister; Obesity in her brother, brother, and son; Polycystic ovary syndrome in her daughter; Stroke in her mother.  ROS:  Please see the history of present illness. All other systems are reviewed and otherwise negative.   PHYSICAL EXAM:  VS:  BP 100/70 (BP Location: Left Arm, Patient Position: Sitting, Cuff Size: Large)   Pulse 78   Ht 5' (1.524 m)   Wt 202 lb (91.6 kg)   SpO2 98%   BMI 39.45 kg/m  BMI: Body mass index is 39.45 kg/m.  GEN- The patient is well appearing, alert and oriented x 3 today.   Lungs- Clear to ausculation bilaterally, normal work of breathing.  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 Personal review of EKG from today shows: A paced rate 78; low voltage,   Recent Labs: 04/15/2022: TSH 0.33 04/05/2023: ALT 13; BUN 14; Creatinine, Ser 0.67; Hemoglobin 10.9; Platelets 378; Potassium 4.3; Sodium 140  No results found for requested labs within last 365 days.   Estimated Creatinine Clearance: 81.1 mL/min (by C-G formula based on SCr of 0.67 mg/dL).   Wt Readings from Last 3 Encounters:  04/15/23 202 lb (91.6 kg)  04/05/23 199 lb 4.7 oz (90.4 kg)  03/25/23 199 lb 8 oz (90.5 kg)     Additional studies reviewed include: Previous EP, cardiology notes.     ASSESSMENT AND PLAN:  #) SND s/p MDT PPM  Device functioning well, see Paceart for details No further episodes of syncope since device placement  #) SCAF Brief A-fib/A-flutter episodes by device CHA2DS2-VASc = 1 (gender) Long discussion about risk factors including OSA evaluation, physical activity, controlling  hypertension, controlling diabetes Continue to monitor via remote transmissions  #) Disordered sleep #) daytime somnolence STOP-BANG 4 She has appointment later today for further OSA evaluation, will inquire about pursuing sleep study.  I let her know that if she needs me to order I am happy to do so and please contact our office   Current medicines are reviewed at length with the patient today.   The patient does not have concerns regarding her medicines.  The following changes were made today:  none  Labs/ tests ordered today include:  Orders Placed This Encounter  Procedures   EKG 12-Lead     Disposition: Follow up with Dr. [name] or EP APP in 12 months   Signed, Sherie Don, NP  04/15/23  2:03 PM  Electrophysiology [facility]

## 2023-04-15 ENCOUNTER — Encounter: Payer: No Typology Code available for payment source | Admitting: Physical Therapy

## 2023-04-15 ENCOUNTER — Telehealth: Payer: Self-pay | Admitting: Nurse Practitioner

## 2023-04-15 ENCOUNTER — Ambulatory Visit: Payer: PRIVATE HEALTH INSURANCE | Attending: Physical Medicine and Rehabilitation

## 2023-04-15 ENCOUNTER — Ambulatory Visit (INDEPENDENT_AMBULATORY_CARE_PROVIDER_SITE_OTHER): Payer: No Typology Code available for payment source | Admitting: Podiatry

## 2023-04-15 ENCOUNTER — Encounter: Payer: Self-pay | Admitting: Nurse Practitioner

## 2023-04-15 ENCOUNTER — Ambulatory Visit: Payer: BLUE CROSS/BLUE SHIELD | Attending: Cardiology | Admitting: Cardiology

## 2023-04-15 ENCOUNTER — Encounter: Payer: Self-pay | Admitting: Cardiology

## 2023-04-15 ENCOUNTER — Ambulatory Visit: Payer: No Typology Code available for payment source | Admitting: Nurse Practitioner

## 2023-04-15 ENCOUNTER — Encounter: Payer: Self-pay | Admitting: Podiatry

## 2023-04-15 VITALS — BP 100/70 | HR 78 | Ht 60.0 in | Wt 202.0 lb

## 2023-04-15 VITALS — BP 122/74 | HR 84 | Temp 97.6°F | Ht 60.0 in | Wt 202.1 lb

## 2023-04-15 DIAGNOSIS — Z1159 Encounter for screening for other viral diseases: Secondary | ICD-10-CM | POA: Diagnosis not present

## 2023-04-15 DIAGNOSIS — G8929 Other chronic pain: Secondary | ICD-10-CM | POA: Insufficient documentation

## 2023-04-15 DIAGNOSIS — Q6689 Other  specified congenital deformities of feet: Secondary | ICD-10-CM

## 2023-04-15 DIAGNOSIS — M5459 Other low back pain: Secondary | ICD-10-CM | POA: Insufficient documentation

## 2023-04-15 DIAGNOSIS — M797 Fibromyalgia: Secondary | ICD-10-CM

## 2023-04-15 DIAGNOSIS — R7989 Other specified abnormal findings of blood chemistry: Secondary | ICD-10-CM

## 2023-04-15 DIAGNOSIS — Z131 Encounter for screening for diabetes mellitus: Secondary | ICD-10-CM

## 2023-04-15 DIAGNOSIS — Z136 Encounter for screening for cardiovascular disorders: Secondary | ICD-10-CM

## 2023-04-15 DIAGNOSIS — Z13220 Encounter for screening for lipoid disorders: Secondary | ICD-10-CM

## 2023-04-15 DIAGNOSIS — M25561 Pain in right knee: Secondary | ICD-10-CM | POA: Insufficient documentation

## 2023-04-15 DIAGNOSIS — D649 Anemia, unspecified: Secondary | ICD-10-CM

## 2023-04-15 DIAGNOSIS — G479 Sleep disorder, unspecified: Secondary | ICD-10-CM

## 2023-04-15 DIAGNOSIS — M5431 Sciatica, right side: Secondary | ICD-10-CM | POA: Insufficient documentation

## 2023-04-15 DIAGNOSIS — Z950 Presence of cardiac pacemaker: Secondary | ICD-10-CM

## 2023-04-15 DIAGNOSIS — M25551 Pain in right hip: Secondary | ICD-10-CM | POA: Insufficient documentation

## 2023-04-15 DIAGNOSIS — I495 Sick sinus syndrome: Secondary | ICD-10-CM | POA: Diagnosis not present

## 2023-04-15 LAB — CUP PACEART INCLINIC DEVICE CHECK
Date Time Interrogation Session: 20240607141816
Implantable Lead Connection Status: 753985
Implantable Lead Connection Status: 753985
Implantable Lead Implant Date: 20210928
Implantable Lead Implant Date: 20210928
Implantable Lead Location: 753859
Implantable Lead Location: 753860
Implantable Lead Model: 4076
Implantable Lead Model: 4076
Implantable Pulse Generator Implant Date: 20210928

## 2023-04-15 LAB — COMPREHENSIVE METABOLIC PANEL
ALT: 11 U/L (ref 0–35)
AST: 16 U/L (ref 0–37)
Albumin: 4.1 g/dL (ref 3.5–5.2)
Alkaline Phosphatase: 120 U/L — ABNORMAL HIGH (ref 39–117)
BUN: 17 mg/dL (ref 6–23)
CO2: 28 mEq/L (ref 19–32)
Calcium: 8.8 mg/dL (ref 8.4–10.5)
Chloride: 104 mEq/L (ref 96–112)
Creatinine, Ser: 0.78 mg/dL (ref 0.40–1.20)
GFR: 86.33 mL/min (ref >60.00)
Glucose, Bld: 80 mg/dL (ref 70–99)
Potassium: 4.1 mEq/L (ref 3.5–5.1)
Sodium: 139 mEq/L (ref 135–145)
Total Bilirubin: 0.4 mg/dL (ref 0.2–1.2)
Total Protein: 7.3 g/dL (ref 6.0–8.3)

## 2023-04-15 LAB — TSH: TSH: 0.8 u[IU]/mL (ref 0.35–5.50)

## 2023-04-15 LAB — FERRITIN: Ferritin: 4.4 ng/mL — ABNORMAL LOW (ref 10.0–291.0)

## 2023-04-15 LAB — LIPID PANEL
Cholesterol: 160 mg/dL (ref 0–200)
HDL: 87.3 mg/dL (ref >39.00)
LDL Cholesterol: 59 mg/dL (ref 0–99)
NonHDL: 73.06
Total CHOL/HDL Ratio: 2
Triglycerides: 70 mg/dL (ref 0.0–149.0)
VLDL: 14 mg/dL (ref 0.0–40.0)

## 2023-04-15 LAB — VITAMIN B12: Vitamin B-12: 627 pg/mL (ref 211–911)

## 2023-04-15 LAB — HEMOGLOBIN A1C: Hgb A1c MFr Bld: 6 % (ref 4.6–6.5)

## 2023-04-15 LAB — FOLATE: Folate: 19.8 ng/mL (ref >5.9)

## 2023-04-15 NOTE — Telephone Encounter (Signed)
Hi, Diana Giles,  Thanks for reaching out.  It looks like the sleep lab team had tried to get in touch with the patient via MyChart messaging in November and then again via phone call in January.  Please asked patient to call us back or send a MyChart message back.  I had ordered the sleep study back in November when I had first seen her.

## 2023-04-15 NOTE — Assessment & Plan Note (Signed)
Labs ordered, further recommendations may be made based upon his results. 

## 2023-04-15 NOTE — Assessment & Plan Note (Signed)
Chronic Check anemia panel today.  Follow-up with GI as well.  Further recommendations may be made based upon lab results.

## 2023-04-15 NOTE — Progress Notes (Signed)
Established Patient Office Visit  Subjective   Patient ID: Diana Giles, female    DOB: 1971/02/01  Age: 52 y.o. MRN: [id number]  Chief Complaint  Patient presents with   Follow-up    Patient seen in the ED for left flank pain, patient is still having some GI issues.    Patient is here for post ER visit visit.  She was seen in the ED for left flank pain on 04/05/2023. Evaluation did not identify UTI, CT scan completed did not show any abnormalities. Patient has upcoming appointment with GI later this month for further evaluation of abdominal pain.  Patient does report to me today that she has a history of H. pylori and is wondering if this may be causing her discomfort. Of note, she was anemic on lab work completed in ER and per chart review hemoglobin has been trending down over the last 16 months or so.  She reports she does have a history of anemia but she is taking an iron pill.  Denies any visible blood in her stool.  She also tells me that her health insurance covers a free FOBT test which she had completed in May and it came back negative for evidence of blood.  Of note, she had been referred to neurology for evaluation of possible sleep apnea.  There were issues with insurance approval and scheduling so this has not yet been completed.  She did see her cardiologist today who was concerned she may be having sleep apnea as they are noting some abnormal rhythms on heart strip overnight.  Patient continues to endorse history of snoring, nocturia 1 time a night, and daytime sleepiness and which she will fall asleep if she sits still for too long.   She is also due for labs that were ordered previously but were never collected.  She is agreeable to have these collected today.    Review of Systems  Constitutional:  Positive for malaise/fatigue. Negative for fever.  Cardiovascular:  Positive for palpitations (associated with anxiety).  Gastrointestinal:  Positive for abdominal pain and  nausea. Negative for vomiting.      Objective:     BP 122/74   Pulse 84   Temp 97.6 F (36.4 C) (Oral)   Ht 5' (1.524 m)   Wt 202 lb 2 oz (91.7 kg)   SpO2 96%   BMI 39.47 kg/m  BP Readings from Last 3 Encounters:  04/15/23 122/74  04/15/23 100/70  04/05/23 129/83   Wt Readings from Last 3 Encounters:  04/15/23 202 lb 2 oz (91.7 kg)  04/15/23 202 lb (91.6 kg)  04/05/23 199 lb 4.7 oz (90.4 kg)      Physical Exam Vitals reviewed.  Constitutional:      General: She is not in acute distress.    Appearance: Normal appearance.  HENT:     Head: Normocephalic and atraumatic.  Neck:     Vascular: No carotid bruit.  Cardiovascular:     Rate and Rhythm: Normal rate and regular rhythm.     Pulses: Normal pulses.     Heart sounds: Normal heart sounds.  Pulmonary:     Effort: Pulmonary effort is normal.     Breath sounds: Normal breath sounds.  Skin:    General: Skin is warm and dry.  Neurological:     General: No focal deficit present.     Mental Status: She is alert and oriented to person, place, and time.  Psychiatric:  Mood and Affect: Mood normal.        Behavior: Behavior normal.        Judgment: Judgment normal.      Results for orders placed or performed in visit on 04/15/23  CUP PACEART [facility] DEVICE CHECK  Result Value Ref Range   Pulse Generator Manufacturer MERM    Date Time Interrogation Session [id number]    Pulse Gen Model [id number] Jamse Mead DR MRI    Pulse Gen Serial Number [id number]    Clinic Name [facility]    Implantable Pulse Generator Type Implantable Pulse Generator    Implantable Pulse Generator Implant Date [id number]    Implantable Lead Manufacturer [facility]    Implantable Lead Model 4076 CapsureFix Novus    Implantable Lead Serial Number [id number]    Implantable Lead Implant Date [id number]    Implantable Lead Location Detail 1 UNKNOWN    Implantable Lead Location [id number]    Implantable Lead Connection Status [id number]     Implantable Lead Manufacturer [facility]    Implantable Lead Model 4076 CapsureFix Novus    Implantable Lead Serial Number [id number]    Implantable Lead Implant Date 36644034    Implantable Lead Location Detail 1 UNKNOWN    Implantable Lead Location [id number]    Implantable Lead Connection Status [id number]       The 10-year ASCVD risk score (Arnett DK, et al., 2019) is: 1%    Assessment & Plan:   Problem List Items Addressed This Visit       Other   Fibromyalgia   Diabetes mellitus screening    Labs ordered, further recommendations may be made based upon his results       Encounter for lipid screening for cardiovascular disease    Labs ordered, further recommendations may be made based upon his results       Encounter for hepatitis C screening test for low risk patient    Labs ordered, further recommendations may be made based upon his results       Anemia - Primary    Chronic Check anemia panel today.  Follow-up with GI as well.  Further recommendations may be made based upon lab results.      Relevant Orders   Vitamin B12   Folate   Iron and TIBC   Ferritin   Abnormal TSH    Noted last year (suppressed TSH). Labs ordered, further recommendations may be made based upon his results        Return for As scheduled with Maralyn Sago next month.    [name], NP

## 2023-04-15 NOTE — Patient Instructions (Signed)
Medication Instructions:   Your physician recommends that you continue on your current medications as directed. Please refer to the Current Medication list given to you today.  *If you need a refill on your cardiac medications before your next appointment, please call your pharmacy*   Lab Work:  No lab work ordered today.   If you have labs (blood work) drawn today and your tests are completely normal, you will receive your results only by: MyChart Message (if you have MyChart) OR A paper copy in the mail If you have any lab test that is abnormal or we need to change your treatment, we will call you to review the results.   Testing/Procedures:  No testing ordered today.   Follow-Up: At [facility], you and your health needs are our priority.  As part of our continuing mission to provide you with exceptional heart care, we have created designated Provider Care Teams.  These Care Teams include your primary Cardiologist (physician) and Advanced Practice Providers (APPs -  Physician Assistants and Nurse Practitioners) who all work together to provide you with the care you need, when you need it.  We recommend signing up for the patient portal called "MyChart".  Sign up information is provided on this After Visit Summary.  MyChart is used to connect with patients for Virtual Visits (Telemedicine).  Patients are able to view lab/test results, encounter notes, upcoming appointments, etc.  Non-urgent messages can be sent to your provider as well.   To learn more about what you can do with MyChart, go to [email or web address].    Your next appointment:   12 month(s)  Provider:   Cameron Lambert, MD  or Suzann Riddle, NP  

## 2023-04-15 NOTE — Telephone Encounter (Signed)
Good afternoon,  I saw this patient in clinic today and she was wondering about where we were regarding scheduling her for her sleep study? Is she ready to schedule or does she need to do anything else to get approval with insurance? What should her next steps be? Thanks,  Maralyn Sago

## 2023-04-15 NOTE — Progress Notes (Signed)
   Chief Complaint  Patient presents with   Toe Pain    New pt- right foot 2nd toe is dark looking PCP wanted the patient to be seen by podiatry    HPI: 52 y.o. female presenting today as a new patient for evaluation of irritation to the distal tip of the bilateral second toes. Denies history of injury. It has been concerning for the past few months. Presenting for further treatment and evaluation.   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    Mental disorder    Rheumatoid arthritis (HCC)    Sinus node dysfunction (HCC)    s/p pacemaker   Thrombocytosis    Vaginal Pap smear, abnormal     Past Surgical History:  Procedure Laterality Date   GASTRIC BYPASS  2010   285lbs at highest weight   HYSTEROSCOPY WITH D & C  08/22/2019   OTHER SURGICAL HISTORY  08/2019   Bilateral fallopian tube removal   PACEMAKER PLACEMENT  08/06/2020   REVISION TOTAL KNEE ARTHROPLASTY Right 06/01/2019   SHOULDER ARTHROSCOPY Left 2018   TUBAL LIGATION Bilateral 08/07/2019    Allergies  Allergen Reactions   Nickel Other (See Comments)   Penicillins     Other Reaction(s): Not available   Shellfish Allergy    Sulfa Antibiotics Rash     Physical Exam: General: The patient is alert and oriented x3 in no acute distress.  Dermatology: Skin is warm, dry and supple bilateral lower extremities.   Vascular: Palpable pedal pulses bilaterally. Capillary refill within normal limits.  No appreciable edema.  No erythema.  Neurological: Grossly intact via light touch  Musculoskeletal Exam: The second toe extends beyond the normal toe parabola consistent with morton's toe. This is bilateral. 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  Assessment/Plan of Care: 1. Morton's toe second digit  bilateral  - patient evaluated - explained that the toe extends beyond the normal parabola which causes persistent microtrauma to the toe and nail plate. - Recommend good shoes that do not irritate the toes and allow plenty of room in the toe box area - return to clinic PRN     Felecia Shelling, DPM [facility]  Dr. Felecia Shelling, DPM    [address] [place], [place] [address]                Office [phone]  Fax [phone]

## 2023-04-15 NOTE — Assessment & Plan Note (Signed)
Noted last year (suppressed TSH). Labs ordered, further recommendations may be made based upon his results

## 2023-04-16 LAB — IRON AND TIBC
Iron Saturation: 7 % — CL (ref 15–55)
Iron: 29 ug/dL (ref 27–159)
Total Iron Binding Capacity: 428 ug/dL (ref 250–450)
UIBC: 399 ug/dL (ref 131–425)

## 2023-04-17 LAB — HEPATITIS C ANTIBODY: Hepatitis C Ab: NONREACTIVE

## 2023-04-18 ENCOUNTER — Other Ambulatory Visit: Payer: Self-pay | Admitting: Nurse Practitioner

## 2023-04-18 ENCOUNTER — Telehealth: Payer: Self-pay | Admitting: Nurse Practitioner

## 2023-04-18 DIAGNOSIS — D509 Iron deficiency anemia, unspecified: Secondary | ICD-10-CM

## 2023-04-18 NOTE — Telephone Encounter (Signed)
Patient was on a call with a nurse in our office, but accidentally disconnected. She would like a call back to continue the conversation. She did not know the name of who she was speaking with. Best callback is [phone].

## 2023-04-19 ENCOUNTER — Ambulatory Visit (HOSPITAL_BASED_OUTPATIENT_CLINIC_OR_DEPARTMENT_OTHER): Payer: PRIVATE HEALTH INSURANCE | Admitting: Physical Therapy

## 2023-04-19 DIAGNOSIS — M5431 Sciatica, right side: Secondary | ICD-10-CM

## 2023-04-19 DIAGNOSIS — G8929 Other chronic pain: Secondary | ICD-10-CM

## 2023-04-19 DIAGNOSIS — M5459 Other low back pain: Secondary | ICD-10-CM | POA: Diagnosis not present

## 2023-04-19 DIAGNOSIS — M25551 Pain in right hip: Secondary | ICD-10-CM

## 2023-04-19 NOTE — Therapy (Signed)
OUTPATIENT PHYSICAL THERAPY THORACOLUMBAR TREATMENT   Patient Name: Diana Giles MRN: [id number] DOB:1970-11-19, 52 y.o., female Today's Date: 04/19/2023   END OF SESSION:  PT End of Session - 04/19/23 1713     Visit Number 10    Date for PT Re-Evaluation 04/29/23    Authorization Type [employer]    Authorization Time Period 03/04/23 to 04/29/23    PT Start Time 1702    PT Stop Time 1742    PT Time Calculation (min) 40 min    Behavior During Therapy WFL for tasks assessed/performed               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    Mental disorder    Rheumatoid arthritis (HCC)    Sinus node dysfunction (HCC)    s/p pacemaker   Thrombocytosis    Vaginal Pap smear, abnormal    Past Surgical History:  Procedure Laterality Date   GASTRIC BYPASS  2010   285lbs at highest weight   HYSTEROSCOPY WITH D & C  08/22/2019   OTHER SURGICAL HISTORY  08/2019   Bilateral fallopian tube removal   PACEMAKER PLACEMENT  08/06/2020   REVISION TOTAL KNEE ARTHROPLASTY Right 06/01/2019   SHOULDER ARTHROSCOPY Left 2018   TUBAL LIGATION Bilateral 08/07/2019   Patient Active Problem List   Diagnosis Date Noted   Anemia 04/15/2023   Abnormal TSH 04/15/2023   Generalized abdominal pain 03/25/2023   Change in nail appearance 03/25/2023   Diabetes mellitus screening 03/25/2023   Encounter for lipid screening for cardiovascular disease 03/25/2023   Encounter for hepatitis C screening test for low risk patient 03/25/2023   Encounter for screening mammogram for malignant neoplasm of breast 03/25/2023   COVID-19 11/26/2022   Chronic bilateral low back pain with right-sided sciatica 09/15/2022   Arthritis pain 09/15/2022   Fibromyalgia 09/15/2022   Chronic pain syndrome 09/15/2022    Bilateral sacroiliitis (HCC) 09/15/2022   Postmenopause 08/09/2022   Pacemaker 01/07/2021   Fatigue 11/13/2020   DDD (degenerative disc disease), lumbar 11/13/2020   Avascular necrosis of bone of hip, right (HCC) 11/13/2020   Sinus node dysfunction (HCC) 11/13/2020   Thrombocytosis 11/13/2020   Anxiety 11/13/2020   Attention deficit hyperactivity disorder (ADHD) 11/13/2020    PCP: [name], NP    REFERRING PROVIDER: Elijah Birk, DO  REFERRING DIAG: Chronic bilateral LBP with Rt sided sciatica; fibromyalgia; chronic pain syndrome; bilateral sacroiliitis  Rationale for Evaluation and Treatment: Rehabilitation  THERAPY DIAG:  Other low back pain  Pain in right hip  Sciatica, right side  Chronic pain of right knee  ONSET DATE:  SUBJECTIVE:  SUBJECTIVE STATEMENT:  Pt reports she went to her GP and is having tests run.  She reports it was a busy day at work.  She was achy after last aquatic session, "but Ok".      EVAL: Pt states that she has been dealing with pain for atleast 20 years, but in the past 5 years its gotten worse with falls, injuries, etc. She was started on Gabapentin and feels this has helped her better pinpoint her pain. Pain is sacrum into the Rt buttock and lateral thigh down around the knee. She has numbness in the lateral foot. She has had PT several times in the past. She is interested in aquatic PT because it's the only thing she hasn't done to help the pain.  Pain is often so bad that she can't get out of bed.   PERTINENT HISTORY:  Rt hip avascular necrosis of hip Anxiety  DDD Fibromyalgia  Rheumatoid Arthritis  Lt shoulder arthroplasty Rt total knee replacement and revision 2020 Pace maker   PAIN:  Are you having pain? Yes: NPRS scale: 6/10 Pain location:  sacrum and Rt buttock, Rt LE/lateral knee Pain description: constant  Aggravating factors: sitting, supine, prolonged activity/walking  Relieving factors: avoiding prolonged activity   PRECAUTIONS: None  WEIGHT BEARING RESTRICTIONS: No  FALLS:  Has patient fallen in last 6 months? No  LIVING ENVIRONMENT: Lives with: lives with their family Lives in: House/apartment Stairs: No Has following equipment at home: None  OCCUPATION: Phlebotomist   PLOF: Independent  PATIENT GOALS: decrease pain  NEXT MD VISIT: f/u on MRI soon  OBJECTIVE:   DIAGNOSTIC FINDINGS:  MRI ordered   PATIENT SURVEYS:  Need next visit  SCREENING FOR RED FLAGS: Bowel or bladder incontinence: No Spinal tumors: No Cauda equina syndrome: No Compression fracture: No Abdominal aneurysm: No  COGNITION: Overall cognitive status: Within functional limits for tasks assessed     SENSATION: Pt notes numbness   MUSCLE LENGTH: Hamstrings: Right WNL deg; Left WNL deg Maisie Fus test: Right (+) Rt for quad and hip flexor  POSTURE: No Significant postural limitations  PALPATION: Tenderness Rt lateral gastroc/peroneals, Rt lateral quad/ITB, Rt glutes  LUMBAR ROM:   AROM eval  Flexion   Extension   Right lateral flexion   Left lateral flexion   Right rotation   Left rotation    (Blank rows = not tested)  LOWER EXTREMITY ROM:     Active  Right eval Left eval  Hip flexion    Hip extension    Hip abduction    Hip adduction    Hip internal rotation    Hip external rotation    Knee flexion    Knee extension    Ankle dorsiflexion    Ankle plantarflexion    Ankle inversion    Ankle eversion     (Blank rows = not tested)  LOWER EXTREMITY MMT:    MMT Right eval Left eval  Hip flexion 4 4  Hip extension 3 4  Hip abduction 3 4  Hip adduction    Hip internal rotation    Hip external rotation    Knee flexion 5 5  Knee extension 4 5  Ankle dorsiflexion    Ankle plantarflexion Heel raise  unable Heel raise x6  Ankle inversion    Ankle eversion     (Blank rows = not tested)  LUMBAR SPECIAL TESTS:  Next visit  FUNCTIONAL TESTS:  5 times sit to stand: 18 sec, weight shifted Lt  Timed up  and go (TUG): 11 sec  GAIT: Distance walked: 62ft  Assistive device utilized: None Level of assistance: Complete Independence Comments: decreased stance on Rt, decreased step length on Lt, decreased push off on Rt   TODAY'S TREATMENT:   04/19/23 Pt seen for aquatic therapy today.  Treatment took place in water 3.5-4.75 ft in depth at the [facility] pool. Temp of water was 91.  Pt entered/exited the pool independently via stairs in step-to pattern with bilat rail.  *Tolerates facing hot tub better than lap pool, with RLE on down slope *  * unsupported back float for decompression * straddling yellow noodle without UE support: suspended cycling with LEs; cross country ski in place; gentle suspended jumping jack LEs; return to cycling  * without UE support:  walking forward/backward with reciprocal arm swing * - side stepping with arm addct with rainbow hand floats * walking forward / backward holding single solid noodle under the water * holding wall: alternating single leg clams x 5 each ,  * holding noodle:  straight LE circles CW/CCW x 5 each;  relaxed squats with vertical trunk x 10; Heel / toe raises x 10; 3 way toe touch x 5 each LE * back against wall:  3 way LE stretch, each LE     Pt requires the buoyancy and hydrostatic pressure of water for support, and to offload joints by unweighting joint load by at least 50 % in navel deep water and by at least 75-80% in chest to neck deep water.  Viscosity of the water is needed for resistance of strengthening. Water current perturbations provides challenge to standing balance requiring increased core activation    PATIENT EDUCATION:  Education details: aquatic therapy exercise rationale and modifications Person educated:  Patient Education method: Chief Technology Officer Education comprehension: verbalized understanding and returned demonstration  HOME EXERCISE PROGRAM: Access Code: [email or web address] URL: [email or web address] Date: 03/04/2023 Prepared by: [facility] - Outpatient Rehab - [facility]  Exercises - Modified Maisie Fus Stretch  - 2 x daily - 7 x weekly - 2 sets - 20 seconds hold - Heel Raises with Counter Support with Brace On  - 3 x daily - 7 x weekly - 10 reps  AQUATIC Access Code: [email or web address] URL: [email or web address] Date: 04/19/2023 Prepared by: [facility] - Outpatient Rehab - [address] [address] This aquatic home exercise program from MedBridge utilizes pictures from land based exercises, but has been adapted prior to lamination and issuance.     ASSESSMENT:  CLINICAL IMPRESSION: Started pt in deeper water with vertical decompression and cycling with positive response. Pt reported pain remained the same, but she had greater ease of movement while submerged in water.  Issued pt laminated HEP to begin using at local pool.  Will progress as able at next pool appt, plan to add single leg heel raises in deeper water.  Pt is progressing gradually towards LTGs.     OBJECTIVE IMPAIRMENTS: Abnormal gait, decreased activity tolerance, decreased balance, decreased endurance, decreased strength, impaired flexibility, and pain.   ACTIVITY LIMITATIONS: lifting, bending, sitting, standing, squatting, stairs, locomotion level, and caring for others  PARTICIPATION LIMITATIONS: cleaning, laundry, interpersonal relationship, shopping, community activity, occupation, and yard work  PERSONAL FACTORS: Age, Fitness, Past/current experiences, Time since onset of injury/illness/exacerbation, and 3+ comorbidities: Rt hip avascular necrosis, fibromyalgia, Rt TKA with revision  are also affecting patient's functional outcome.   REHAB POTENTIAL: Good  CLINICAL DECISION MAKING:  Evolving/moderate complexity  EVALUATION COMPLEXITY: Moderate   GOALS: Goals reviewed with patient?  Yes  SHORT TERM GOALS: Target date: 03/11/23  Pt will be independent with her initial HEP to improve LE strength and flexibility. Baseline: Goal status: INITIAL   LONG TERM GOALS: Target date: 04/29/23  Pt will report atleast 40% improvement in her activity participation from the start of PT. Baseline:  Goal status: INITIAL  2.  Pt will have improved hip strength to atleast 4/5 MMT which will increase efficiency with daily activity. Baseline:  Goal status: INITIAL  3.  Pt will have improved Rt hip flexibility evident by negative thomas test.  Baseline:  Goal status: INITIAL  4.  Pt will have improved gastroc strength bilaterally, able to complete atleast 15 consecutive single leg heel raises. Baseline:  Goal status: INITIAL  5.  Pt will be independent with an advanced HEP to increase  Baseline:  Goal status: INITIAL   PLAN:  PT FREQUENCY: 2x/week  PT DURATION: 8 weeks  PLANNED INTERVENTIONS: Therapeutic exercises, Therapeutic activity, Neuromuscular re-education, Balance training, Gait training, Patient/Family education, Self Care, Joint mobilization, Aquatic Therapy, Dry Needling, Moist heat, Taping, Manual therapy, and Re-evaluation.  PLAN FOR NEXT SESSION: introduce more core stability exercises and add to HEP.  Assess goals.   Mayer Camel, PTA 04/19/23 6:09 PM [facility] [address] [place], [place], [address] Phone: [phone]   Fax:  [phone]

## 2023-04-21 NOTE — Telephone Encounter (Signed)
[employer] no auth req for either NPSG/HST spoke with Ellin Saba ref # [id number].

## 2023-04-22 ENCOUNTER — Ambulatory Visit: Payer: PRIVATE HEALTH INSURANCE

## 2023-04-22 ENCOUNTER — Encounter: Payer: No Typology Code available for payment source | Admitting: Physical Therapy

## 2023-04-22 DIAGNOSIS — M5431 Sciatica, right side: Secondary | ICD-10-CM | POA: Diagnosis present

## 2023-04-22 DIAGNOSIS — M25551 Pain in right hip: Secondary | ICD-10-CM

## 2023-04-22 DIAGNOSIS — M5459 Other low back pain: Secondary | ICD-10-CM

## 2023-04-22 DIAGNOSIS — M25561 Pain in right knee: Secondary | ICD-10-CM | POA: Diagnosis present

## 2023-04-22 DIAGNOSIS — G8929 Other chronic pain: Secondary | ICD-10-CM

## 2023-04-22 NOTE — Therapy (Signed)
OUTPATIENT PHYSICAL THERAPY THORACOLUMBAR TREATMENT/PROGRESS NOTE  Dates of reporting period: 03/04/23 - 04/22/23    Patient Name: Diana Giles MRN: [id number] DOB:Nov 22, 1970, 52 y.o., female Today's Date: 04/22/2023   END OF SESSION:  PT End of Session - 04/22/23 0903     Visit Number 11    Date for PT Re-Evaluation 04/29/23    Authorization Type [employer]    Authorization Time Period 03/04/23 to 04/29/23    PT Start Time 0902    PT Stop Time 0945    PT Time Calculation (min) 43 min    Activity Tolerance Patient tolerated treatment well    Behavior During Therapy WFL for tasks assessed/performed               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    Mental disorder    Rheumatoid arthritis (HCC)    Sinus node dysfunction (HCC)    s/p pacemaker   Thrombocytosis    Vaginal Pap smear, abnormal    Past Surgical History:  Procedure Laterality Date   GASTRIC BYPASS  2010   285lbs at highest weight   HYSTEROSCOPY WITH D & C  08/22/2019   OTHER SURGICAL HISTORY  08/2019   Bilateral fallopian tube removal   PACEMAKER PLACEMENT  08/06/2020   REVISION TOTAL KNEE ARTHROPLASTY Right 06/01/2019   SHOULDER ARTHROSCOPY Left 2018   TUBAL LIGATION Bilateral 08/07/2019   Patient Active Problem List   Diagnosis Date Noted   Anemia 04/15/2023   Abnormal TSH 04/15/2023   Generalized abdominal pain 03/25/2023   Change in nail appearance 03/25/2023   Diabetes mellitus screening 03/25/2023   Encounter for lipid screening for cardiovascular disease 03/25/2023   Encounter for hepatitis C screening test for low risk patient 03/25/2023   Encounter for screening mammogram for malignant neoplasm of breast 03/25/2023   COVID-19 11/26/2022   Chronic bilateral low back pain with right-sided  sciatica 09/15/2022   Arthritis pain 09/15/2022   Fibromyalgia 09/15/2022   Chronic pain syndrome 09/15/2022   Bilateral sacroiliitis (HCC) 09/15/2022   Postmenopause 08/09/2022   Pacemaker 01/07/2021   Fatigue 11/13/2020   DDD (degenerative disc disease), lumbar 11/13/2020   Avascular necrosis of bone of hip, right (HCC) 11/13/2020   Sinus node dysfunction (HCC) 11/13/2020   Thrombocytosis 11/13/2020   Anxiety 11/13/2020   Attention deficit hyperactivity disorder (ADHD) 11/13/2020    PCP: [name], NP    REFERRING PROVIDER: Elijah Birk, DO  REFERRING DIAG: Chronic bilateral LBP with Rt sided sciatica; fibromyalgia; chronic pain syndrome; bilateral sacroiliitis  Rationale for Evaluation and Treatment: Rehabilitation  THERAPY DIAG:  Other low back pain  Pain in right hip  Sciatica, right side  Chronic pain of right knee  ONSET DATE:  SUBJECTIVE:  SUBJECTIVE STATEMENT:  Pt reports pain in R lower back and leg is 7/10 NPS. Reports aquatic therapy is going well and has been very sore.    EVAL: Pt states that she has been dealing with pain for atleast 20 years, but in the past 5 years its gotten worse with falls, injuries, etc. She was started on Gabapentin and feels this has helped her better pinpoint her pain. Pain is sacrum into the Rt buttock and lateral thigh down around the knee. She has numbness in the lateral foot. She has had PT several times in the past. She is interested in aquatic PT because it's the only thing she hasn't done to help the pain.  Pain is often so bad that she can't get out of bed.   PERTINENT HISTORY:  Rt hip avascular necrosis of hip Anxiety  DDD Fibromyalgia  Rheumatoid Arthritis  Lt shoulder arthroplasty Rt total knee replacement and revision 2020 Pace  maker   PAIN:  Are you having pain? Yes: NPRS scale: 7/10 Pain location: sacrum and Rt buttock, Rt LE/lateral knee Pain description: constant  Aggravating factors: sitting, supine, prolonged activity/walking  Relieving factors: avoiding prolonged activity   PRECAUTIONS: None  WEIGHT BEARING RESTRICTIONS: No  FALLS:  Has patient fallen in last 6 months? No  LIVING ENVIRONMENT: Lives with: lives with their family Lives in: House/apartment Stairs: No Has following equipment at home: None  OCCUPATION: Phlebotomist   PLOF: Independent  PATIENT GOALS: decrease pain  NEXT MD VISIT: f/u on MRI soon  OBJECTIVE:   DIAGNOSTIC FINDINGS:  MRI ordered   PATIENT SURVEYS:  Need next visit  SCREENING FOR RED FLAGS: Bowel or bladder incontinence: No Spinal tumors: No Cauda equina syndrome: No Compression fracture: No Abdominal aneurysm: No  COGNITION: Overall cognitive status: Within functional limits for tasks assessed     SENSATION: Pt notes numbness   MUSCLE LENGTH: Hamstrings: Right WNL deg; Left WNL deg Thomas test: Right (+) Rt for quad and hip flexor  POSTURE: No Significant postural limitations  PALPATION: Tenderness Rt lateral gastroc/peroneals, Rt lateral quad/ITB, Rt glutes  LUMBAR ROM:   AROM eval  Flexion   Extension   Right lateral flexion   Left lateral flexion   Right rotation   Left rotation    (Blank rows = not tested)  LOWER EXTREMITY ROM:     Active  Right eval Left eval  Hip flexion    Hip extension    Hip abduction    Hip adduction    Hip internal rotation    Hip external rotation    Knee flexion    Knee extension    Ankle dorsiflexion    Ankle plantarflexion    Ankle inversion    Ankle eversion     (Blank rows = not tested)  LOWER EXTREMITY MMT:    MMT Right eval Left eval Right  04/22/23 Left  04/22/23  Hip flexion 4 4    Hip extension 3 4 3+   Hip abduction 3 4 4    Hip adduction      Hip internal rotation       Hip external rotation      Knee flexion 5 5    Knee extension 4 5    Ankle dorsiflexion      Ankle plantarflexion Heel raise unable Heel raise x6 Heel raise x6 Heel raise x7  Ankle inversion      Ankle eversion       (Blank rows = not tested)  LUMBAR SPECIAL TESTS:  Next visit  FUNCTIONAL TESTS:  5 times sit to stand: 18 sec, weight shifted Lt  Timed up and go (TUG): 11 sec  GAIT: Distance walked: 74ft  Assistive device utilized: None Level of assistance: Complete Independence Comments: decreased stance on Rt, decreased step length on Lt, decreased push off on Rt    TODAY'S TREATMENT:   Date: 04/22/23  There.Ex:  Beginning of session spent reviewing eval goals. See goals section for details.  Seated core activation while marching on clear physioball, x10  Hooklying LTR 2x15/side   Hooklying bridging with glute squeeze prior to lift off, 2x8. VC's for engaging   Hook lying posterior pelvic tilts: x20   Hook lying posterior pelvic tilts + alt marches: 2x8/LE  Updated HEP.  PATIENT EDUCATION:  Education details: eval findings/POC; implemented HEP; benefits of aquatic PT; chronic pain response Person educated: Patient Education method: Explanation and Handouts Education comprehension: verbalized understanding and returned demonstration  HOME EXERCISE PROGRAM: Access Code: [email or web address] URL: [email or web address] Date: 04/22/2023 Prepared by: Ronnie Derby  Exercises - Modified Erby Pian  - 2 x daily - 7 x weekly - 2 sets - 20 seconds hold - Heel Raises with Counter Support with Brace On  - 3 x daily - 7 x weekly - 10 reps - Supine Bridge  - 1 x daily - 7 x weekly - 3 sets - 8 reps - Supine Posterior Pelvic Tilt  - 1 x daily - 7 x weekly - 2 sets - 20 reps - Supine March with Posterior Pelvic Tilt  - 1 x daily - 7 x weekly - 2 sets - 8 reps   Access Code: [email or web address] URL: [email or web address] Date: 03/04/2023 Prepared by: [facility] -  Outpatient Rehab - [facility]  Exercises - Modified Maisie Fus Stretch  - 2 x daily - 7 x weekly - 2 sets - 20 seconds hold - Heel Raises with Counter Support with Brace On  - 3 x daily - 7 x weekly - 10 reps  ASSESSMENT:  CLINICAL IMPRESSION: Pt on 11th visit warranting progress note. Pt making good progress towards her long term goals with LLE strength. Pt remains most limited with hip extension strength as evident with STS and MMT. Pain remains persistent for pt however but she reports being more active due to trying to improve her health and due to feeling stronger. Updated HEP to continue to make functional gains. Patient's condition has the potential to improve in response to therapy. Maximum improvement is yet to be obtained. The anticipated improvement is attainable and reasonable in a generally predictable time. Pt will continue to benefit from skilled therapy to address remaining deficits in order to improve overall QoL and return to PLOF.         OBJECTIVE IMPAIRMENTS: Abnormal gait, decreased activity tolerance, decreased balance, decreased endurance, decreased strength, impaired flexibility, and pain.   ACTIVITY LIMITATIONS: lifting, bending, sitting, standing, squatting, stairs, locomotion level, and caring for others  PARTICIPATION LIMITATIONS: cleaning, laundry, interpersonal relationship, shopping, community activity, occupation, and yard work  PERSONAL FACTORS: Age, Fitness, Past/current experiences, Time since onset of injury/illness/exacerbation, and 3+ comorbidities: Rt hip avascular necrosis, fibromyalgia, Rt TKA with revision  are also affecting patient's functional outcome.   REHAB POTENTIAL: Good  CLINICAL DECISION MAKING: Evolving/moderate complexity  EVALUATION COMPLEXITY: Moderate   GOALS: Goals reviewed with patient? Yes  SHORT TERM GOALS: Target date: 03/11/23  Pt will be independent with her initial HEP to improve LE strength and  flexibility. Baseline: ; 04/22/23: 75-85% compliance Goal status: In progress    LONG TERM GOALS: Target date: 04/29/23  Pt will report atleast 40% improvement in her activity participation from the start of PT. Baseline:  ; 04/22/23: 20% improvement in activity for exercise (walking, playing with niece's kids) Goal status: In progress  2.  Pt will have improved hip strength to atleast 4/5 MMT which will increase efficiency with daily activity. Baseline: ; 04/22/23: hip extension 3+ Goal status: In progress   3.  Pt will have improved Rt hip flexibility evident by negative thomas test.  Baseline: ; 04/22/23: negative Goal status: MET  4.  Pt will have improved gastroc strength bilaterally, able to complete atleast 15 consecutive single leg heel raises. Baseline: 04/22/23: RLE: 6, LLE: 7  Goal status: In progress  5.  Pt will be independent with an advanced HEP to increase  Baseline: ;Deferred; incomplete written goal from eval paperwork Goal status: INITIAL   PLAN:  PT FREQUENCY: 2x/week  PT DURATION: 8 weeks  PLANNED INTERVENTIONS: Therapeutic exercises, Therapeutic activity, Neuromuscular re-education, Balance training, Gait training, Patient/Family education, Self Care, Joint mobilization, Aquatic Therapy, Dry Needling, Moist heat, Taping, Manual therapy, and Re-evaluation.  PLAN FOR NEXT SESSION: introduce more core stability exercises and add to HEP.   Delphia Grates. Fairly IV, PT, DPT Physical Therapist- [place]  [facility]  04/22/23, 9:52 AM

## 2023-04-26 ENCOUNTER — Ambulatory Visit (HOSPITAL_BASED_OUTPATIENT_CLINIC_OR_DEPARTMENT_OTHER): Payer: No Typology Code available for payment source | Admitting: Physical Therapy

## 2023-04-28 ENCOUNTER — Other Ambulatory Visit: Payer: Self-pay | Admitting: Nurse Practitioner

## 2023-04-28 DIAGNOSIS — F419 Anxiety disorder, unspecified: Secondary | ICD-10-CM

## 2023-04-28 MED ORDER — CLONAZEPAM 1 MG PO TABS
1.0000 mg | ORAL_TABLET | Freq: Two times a day (BID) | ORAL | 0 refills | Status: DC | PRN
Start: 2023-04-28 — End: 2023-06-03

## 2023-04-29 ENCOUNTER — Encounter: Payer: No Typology Code available for payment source | Admitting: Physical Therapy

## 2023-04-29 ENCOUNTER — Ambulatory Visit: Payer: PRIVATE HEALTH INSURANCE

## 2023-04-29 ENCOUNTER — Inpatient Hospital Stay: Payer: BLUE CROSS/BLUE SHIELD | Attending: Oncology | Admitting: Oncology

## 2023-04-29 ENCOUNTER — Encounter: Payer: Self-pay | Admitting: Oncology

## 2023-04-29 ENCOUNTER — Telehealth: Payer: Self-pay | Admitting: Nurse Practitioner

## 2023-04-29 ENCOUNTER — Inpatient Hospital Stay: Payer: No Typology Code available for payment source

## 2023-04-29 VITALS — BP 108/58 | HR 75 | Temp 97.5°F | Resp 18 | Ht 60.0 in | Wt 202.0 lb

## 2023-04-29 DIAGNOSIS — M069 Rheumatoid arthritis, unspecified: Secondary | ICD-10-CM | POA: Insufficient documentation

## 2023-04-29 DIAGNOSIS — D509 Iron deficiency anemia, unspecified: Secondary | ICD-10-CM | POA: Diagnosis present

## 2023-04-29 DIAGNOSIS — R109 Unspecified abdominal pain: Secondary | ICD-10-CM | POA: Diagnosis not present

## 2023-04-29 DIAGNOSIS — K219 Gastro-esophageal reflux disease without esophagitis: Secondary | ICD-10-CM | POA: Diagnosis not present

## 2023-04-29 DIAGNOSIS — M797 Fibromyalgia: Secondary | ICD-10-CM | POA: Diagnosis not present

## 2023-04-29 DIAGNOSIS — Z79624 Long term (current) use of inhibitors of nucleotide synthesis: Secondary | ICD-10-CM | POA: Insufficient documentation

## 2023-04-29 DIAGNOSIS — Z791 Long term (current) use of non-steroidal anti-inflammatories (NSAID): Secondary | ICD-10-CM | POA: Insufficient documentation

## 2023-04-29 DIAGNOSIS — D649 Anemia, unspecified: Secondary | ICD-10-CM | POA: Insufficient documentation

## 2023-04-29 DIAGNOSIS — Z79890 Hormone replacement therapy (postmenopausal): Secondary | ICD-10-CM | POA: Diagnosis not present

## 2023-04-29 DIAGNOSIS — M47816 Spondylosis without myelopathy or radiculopathy, lumbar region: Secondary | ICD-10-CM | POA: Insufficient documentation

## 2023-04-29 DIAGNOSIS — M5459 Other low back pain: Secondary | ICD-10-CM | POA: Diagnosis not present

## 2023-04-29 DIAGNOSIS — M5431 Sciatica, right side: Secondary | ICD-10-CM

## 2023-04-29 DIAGNOSIS — Z8616 Personal history of COVID-19: Secondary | ICD-10-CM | POA: Diagnosis not present

## 2023-04-29 DIAGNOSIS — Z9884 Bariatric surgery status: Secondary | ICD-10-CM | POA: Diagnosis not present

## 2023-04-29 DIAGNOSIS — M25551 Pain in right hip: Secondary | ICD-10-CM

## 2023-04-29 DIAGNOSIS — M47817 Spondylosis without myelopathy or radiculopathy, lumbosacral region: Secondary | ICD-10-CM | POA: Diagnosis not present

## 2023-04-29 DIAGNOSIS — Z79899 Other long term (current) drug therapy: Secondary | ICD-10-CM | POA: Insufficient documentation

## 2023-04-29 DIAGNOSIS — G8929 Other chronic pain: Secondary | ICD-10-CM

## 2023-04-29 MED FILL — Iron Sucrose Inj 20 MG/ML (Fe Equiv): INTRAVENOUS | Qty: 10 | Status: AC

## 2023-04-29 NOTE — Telephone Encounter (Signed)
Patient dropped off document FMLA, to be filled out by provider. Patient requested to send it via Call Patient to pick up within 7-days. Document is located in providers tray at front office.Please advise at [facility] [phone]

## 2023-04-29 NOTE — Therapy (Signed)
OUTPATIENT PHYSICAL THERAPY THORACOLUMBAR TREATMENT/RECERT     Patient Name: Diana Giles MRN: [id number] DOB:1971/02/11, 52 y.o., female Today's Date: 04/29/2023   END OF SESSION:  PT End of Session - 04/29/23 0929     Visit Number 12    Date for PT Re-Evaluation 06/10/23    Authorization Type [employer]    Authorization Time Period --    PT Start Time 0856    PT Stop Time 0940    PT Time Calculation (min) 44 min    Activity Tolerance Patient tolerated treatment well    Behavior During Therapy WFL for tasks assessed/performed                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    Mental disorder    Rheumatoid arthritis (HCC)    Sinus node dysfunction (HCC)    s/p pacemaker   Thrombocytosis    Vaginal Pap smear, abnormal    Past Surgical History:  Procedure Laterality Date   GASTRIC BYPASS  2010   285lbs at highest weight   HYSTEROSCOPY WITH D & C  08/22/2019   OTHER SURGICAL HISTORY  08/2019   Bilateral fallopian tube removal   PACEMAKER PLACEMENT  08/06/2020   REVISION TOTAL KNEE ARTHROPLASTY Right 06/01/2019   SHOULDER ARTHROSCOPY Left 2018   TUBAL LIGATION Bilateral 08/07/2019   Patient Active Problem List   Diagnosis Date Noted   Anemia 04/15/2023   Abnormal TSH 04/15/2023   Generalized abdominal pain 03/25/2023   Change in nail appearance 03/25/2023   Diabetes mellitus screening 03/25/2023   Encounter for lipid screening for cardiovascular disease 03/25/2023   Encounter for hepatitis C screening test for low risk patient 03/25/2023   Encounter for screening mammogram for malignant neoplasm of breast 03/25/2023   COVID-19 11/26/2022   Chronic bilateral low back pain with right-sided sciatica 09/15/2022   Arthritis pain 09/15/2022   Fibromyalgia  09/15/2022   Chronic pain syndrome 09/15/2022   Bilateral sacroiliitis (HCC) 09/15/2022   Postmenopause 08/09/2022   Pacemaker 01/07/2021   Fatigue 11/13/2020   DDD (degenerative disc disease), lumbar 11/13/2020   Avascular necrosis of bone of hip, right (HCC) 11/13/2020   Sinus node dysfunction (HCC) 11/13/2020   Thrombocytosis 11/13/2020   Anxiety 11/13/2020   Attention deficit hyperactivity disorder (ADHD) 11/13/2020    PCP: [name], NP    REFERRING PROVIDER: Elijah Birk, DO  REFERRING DIAG: Chronic bilateral LBP with Rt sided sciatica; fibromyalgia; chronic pain syndrome; bilateral sacroiliitis  Rationale for Evaluation and Treatment: Rehabilitation  THERAPY DIAG:  Other low back pain  Pain in right hip  Sciatica, right side  Chronic pain of right knee  ONSET DATE:  SUBJECTIVE:  SUBJECTIVE STATEMENT:  Pt reports pain in R lower back and leg is 7/10 NPS. Reports having some A-fib issues at night and MD's concerned she may have sleep apnea. Pt reports  some GI pain and low iron.    EVAL: Pt states that she has been dealing with pain for atleast 20 years, but in the past 5 years its gotten worse with falls, injuries, etc. She was started on Gabapentin and feels this has helped her better pinpoint her pain. Pain is sacrum into the Rt buttock and lateral thigh down around the knee. She has numbness in the lateral foot. She has had PT several times in the past. She is interested in aquatic PT because it's the only thing she hasn't done to help the pain.  Pain is often so bad that she can't get out of bed.   PERTINENT HISTORY:  Rt hip avascular necrosis of hip Anxiety  DDD Fibromyalgia  Rheumatoid Arthritis  Lt shoulder arthroplasty Rt total knee replacement and revision 2020 Pace  maker   PAIN:  Are you having pain? Yes: NPRS scale: 7/10 Pain location: sacrum and Rt buttock, Rt LE/lateral knee Pain description: constant  Aggravating factors: sitting, supine, prolonged activity/walking  Relieving factors: avoiding prolonged activity   PRECAUTIONS: None  WEIGHT BEARING RESTRICTIONS: No  FALLS:  Has patient fallen in last 6 months? No  LIVING ENVIRONMENT: Lives with: lives with their family Lives in: House/apartment Stairs: No Has following equipment at home: None  OCCUPATION: Phlebotomist   PLOF: Independent  PATIENT GOALS: decrease pain  NEXT MD VISIT: f/u on MRI soon  OBJECTIVE:   DIAGNOSTIC FINDINGS:  MRI ordered   PATIENT SURVEYS:  Need next visit  SCREENING FOR RED FLAGS: Bowel or bladder incontinence: No Spinal tumors: No Cauda equina syndrome: No Compression fracture: No Abdominal aneurysm: No  COGNITION: Overall cognitive status: Within functional limits for tasks assessed     SENSATION: Pt notes numbness   MUSCLE LENGTH: Hamstrings: Right WNL deg; Left WNL deg Maisie Fus test: Right (+) Rt for quad and hip flexor  POSTURE: No Significant postural limitations  PALPATION: Tenderness Rt lateral gastroc/peroneals, Rt lateral quad/ITB, Rt glutes  LUMBAR ROM:   AROM eval  Flexion   Extension   Right lateral flexion   Left lateral flexion   Right rotation   Left rotation    (Blank rows = not tested)  LOWER EXTREMITY ROM:     Active  Right 04/29/23 Left 04/29/23  Hip flexion 110* 110  Hip extension    Hip abduction    Hip adduction    Hip internal rotation 41* 52  Hip external rotation 25 45  Knee flexion    Knee extension    Ankle dorsiflexion    Ankle plantarflexion    Ankle inversion    Ankle eversion     (Blank rows = not tested)  LOWER EXTREMITY MMT:    MMT Right eval Left eval Right  04/22/23 Left  04/22/23  Hip flexion 4 4    Hip extension 3 4 3+   Hip abduction 3 4 4    Hip adduction      Hip  internal rotation      Hip external rotation      Knee flexion 5 5    Knee extension 4 5    Ankle dorsiflexion      Ankle plantarflexion Heel raise unable Heel raise x6 Heel raise x6 Heel raise x7  Ankle inversion      Ankle  eversion       (Blank rows = not tested)  LUMBAR SPECIAL TESTS:  Next visit  FUNCTIONAL TESTS:  5 times sit to stand: 18 sec, weight shifted Lt  Timed up and go (TUG): 11 sec 6 MWT: 1,046'  GAIT: Distance walked: 69ft  Assistive device utilized: None Level of assistance: Complete Independence Comments: decreased stance on Rt, decreased step length on Lt, decreased push off on Rt    TODAY'S TREATMENT:   Date: 04/29/23  There.Ex: Nu-Step L3 for 5 min for warm up and interval history taking.   Beginning of session spent with further MSK testing. See above for B hip AROM measurements   6 MWT: 1,046'.   3x10, 6# Med ball bridges.  Hooklying LTR 2x15/side   Hook lying posterior pelvic tilts + alt marches: 2x8/LE  Side stepping for glut med strength: 4x10 meters  Seated clamshells with RTB   PATIENT EDUCATION:  Education details: eval findings/POC; implemented HEP; benefits of aquatic PT; chronic pain response Person educated: Patient Education method: Chief Technology Officer Education comprehension: verbalized understanding and returned demonstration  HOME EXERCISE PROGRAM: Access Code: [email or web address] URL: [email or web address] Date: 04/22/2023 Prepared by: Ronnie Derby  Exercises - Modified Erby Pian  - 2 x daily - 7 x weekly - 2 sets - 20 seconds hold - Heel Raises with Counter Support with Brace On  - 3 x daily - 7 x weekly - 10 reps - Supine Bridge  - 1 x daily - 7 x weekly - 3 sets - 8 reps - Supine Posterior Pelvic Tilt  - 1 x daily - 7 x weekly - 2 sets - 20 reps - Supine March with Posterior Pelvic Tilt  - 1 x daily - 7 x weekly - 2 sets - 8 reps   Access Code: [email or web address] URL:  [email or web address] Date: 03/04/2023 Prepared by: [facility]  Exercises - Modified Maisie Fus Stretch  - 2 x daily - 7 x weekly - 2 sets - 20 seconds hold - Heel Raises with Counter Support with Brace On  - 3 x daily - 7 x weekly - 10 reps  ASSESSMENT:  CLINICAL IMPRESSION: Pt at end of POC warranting recert. See last visit for data towards previous goals as progress note was performed. Pt demonstrating improve RLE strength compared to beginning of POC. Pt with some mild R sided limitations in R hip AROM in flexion, ER and IR with concordant LBP indicative of hx of R hip avascular necrosis is contributing to her symptoms. Pt with limitations in community distance as evident by 6 MWT  completing 1,046'. Adding 6 MWT goal in POC to further track pt's strength, endurance, and tolerance for work walking tasks and community distances. Encouraged pt to maintain aquatic PT and will plan for land based PT 1x/week for additional 6 weeks to maximize functional mobility towards goals. Pt will continue to benefit from skilled therapy to address remaining deficits in order to improve overall QoL and return to PLOF.       OBJECTIVE IMPAIRMENTS: Abnormal gait, decreased activity tolerance, decreased balance, decreased endurance, decreased strength, impaired flexibility, and pain.   ACTIVITY LIMITATIONS: lifting, bending, sitting, standing, squatting, stairs, locomotion level, and caring for others  PARTICIPATION LIMITATIONS: cleaning, laundry, interpersonal relationship, shopping, community activity, occupation, and yard work  PERSONAL FACTORS: Age, Fitness, Past/current experiences, Time since onset of injury/illness/exacerbation, and 3+ comorbidities: Rt hip avascular necrosis, fibromyalgia, Rt TKA with revision  are also affecting patient's  functional outcome.   REHAB POTENTIAL: Good  CLINICAL DECISION MAKING: Evolving/moderate  complexity  EVALUATION COMPLEXITY: Moderate   GOALS: Goals reviewed with patient? Yes  SHORT TERM GOALS: Target date: 03/11/23  Pt will be independent with her initial HEP to improve LE strength and flexibility. Baseline: ; 04/22/23: 75-85% compliance Goal status: In progress    LONG TERM GOALS: Target date: 06/10/23  Pt will report atleast 40% improvement in her activity participation from the start of PT. Baseline:  ; 04/22/23: 20% improvement in activity for exercise (walking, playing with niece's kids) Goal status: In progress  2.  Pt will have improved hip strength to atleast 4/5 MMT which will increase efficiency with daily activity. Baseline: ; 04/22/23: hip extension 3+ Goal status: In progress   3.  Pt will have improved Rt hip flexibility evident by negative thomas test.  Baseline: ; 04/22/23: negative Goal status: MET  4.  Pt will have improved gastroc strength bilaterally, able to complete atleast 15 consecutive single leg heel raises. Baseline: 04/22/23: RLE: 6, LLE: 7  Goal status: In progress  5.  Pt will be independent with an advanced HEP to increase  Baseline: ;Deferred; incomplete written goal from eval paperwork Goal status: INITIAL  6. Pt will improve 6 MWT by at least 164' indicating clinically significant improvement in community ambulation and work distances.  Baseline: 04/29/23: 1,046' Goal Status: INITIAL   PLAN:  PT FREQUENCY: 2x/week  PT DURATION: 6 weeks  PLANNED INTERVENTIONS: Therapeutic exercises, Therapeutic activity, Neuromuscular re-education, Balance training, Gait training, Patient/Family education, Self Care, Joint mobilization, Aquatic Therapy, Dry Needling, Moist heat, Taping, Manual therapy, and Re-evaluation.  PLAN FOR NEXT SESSION: introduce more core stability exercises and add to HEP.   Delphia Grates. Fairly IV, PT, DPT Physical Therapist- [place]  [facility]  04/29/23, 10:06 AM

## 2023-04-29 NOTE — Progress Notes (Signed)
[facility]  [phone] [phone]  ID: Diana Giles OB: 23-Jun-1971  MR#: [id number]  QIO#:[id number]  Patient Care Team: [name], NP as PCP - General (Nurse Practitioner) [name], MD as PCP - Electrophysiology (Cardiology) Lanelle Bal, DO as Consulting Physician (Internal Medicine) Pollyann Savoy, MD as Consulting Physician (Rheumatology)  CHIEF COMPLAINT: Iron deficiency anemia.  INTERVAL HISTORY: Patient is a 52 year old female with history of gastric bypass surgery and chronic anemia.  Recently, lab work noted her hemoglobin and iron stores trending down.  She is referred for consideration of IV iron.  She currently feels well and is at her baseline.  She does not complain of any weakness or fatigue.  She has no neurologic complaints.  She denies any recent fevers or illnesses.  She has a good appetite and denies weight loss.  She has no chest pain, shortness of breath, cough, or hemoptysis.  She denies any nausea, vomiting, constipation, or diarrhea.  She has no melena or hematochezia.  She has no urinary complaints.  Patient offers no further specific complaints today.  REVIEW OF SYSTEMS:   Review of Systems  Constitutional:  Negative for fever, malaise/fatigue and weight loss.  Respiratory: Negative.  Negative for cough, hemoptysis and shortness of breath.   Cardiovascular: Negative.  Negative for chest pain and leg swelling.  Gastrointestinal: Negative.  Negative for abdominal pain, blood in stool and melena.  Genitourinary: Negative.  Negative for hematuria.  Musculoskeletal: Negative.  Negative for back pain.  Skin: Negative.  Negative for rash.  Neurological: Negative.  Negative for dizziness, focal weakness, weakness and headaches.  Psychiatric/Behavioral: Negative.  The patient is not nervous/anxious.     As per HPI. Otherwise, a complete review of systems is negative.  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    Mental disorder    Rheumatoid arthritis (HCC)    Sinus node dysfunction (HCC)    s/p pacemaker   Thrombocytosis    Vaginal Pap smear, abnormal     PAST SURGICAL HISTORY: Past Surgical History:  Procedure Laterality Date   CESAREAN SECTION  06/02/94   GASTRIC BYPASS  2010   285lbs at highest weight   HYSTEROSCOPY WITH D & C  08/22/2019   JOINT REPLACEMENT  05/2019   Revision Right knee   OTHER SURGICAL HISTORY  08/2019   Bilateral fallopian tube removal   PACEMAKER PLACEMENT  08/06/2020   REVISION TOTAL KNEE ARTHROPLASTY Right 06/01/2019   SHOULDER ARTHROSCOPY Left 2018   TUBAL LIGATION Bilateral 08/07/2019    FAMILY HISTORY: Family History  Problem Relation Age of Onset   Hypertension Mother    Stroke Mother    Asthma Mother    COPD Mother    Early death Mother    Diabetes Father    Heart disease Father    Early death Father    Kidney disease Father    Hyperthyroidism Sister    COPD Sister    Obesity Brother    Hypertension Brother    Asthma Brother    Anxiety disorder  Brother    Asthma Brother    Obesity Brother    Diabetes Brother    Asthma Brother    Asthma Brother    Polycystic ovary syndrome Daughter    Asthma Son    Obesity Son    ADD / ADHD Son    Colon cancer Cousin    Asthma Sister    COPD Sister    Miscarriages / [place] Sister     ADVANCED DIRECTIVES (Y/N):  N  HEALTH MAINTENANCE: Social History   Tobacco Use   Smoking status: Never    Passive exposure: Past   Smokeless tobacco: Never  Vaping Use   Vaping Use: Never used  Substance Use Topics   Alcohol use: Not Currently     Comment: Occasionally   Drug use: Never     Colonoscopy:  PAP:  Bone density:  Lipid panel:  Allergies  Allergen Reactions   Nickel Other (See Comments)   Penicillins     Other Reaction(s): Not available   Shellfish Allergy    Sulfa Antibiotics Rash    Current Outpatient Medications  Medication Sig Dispense Refill   acetaminophen (TYLENOL) 500 MG tablet Take 500 mg by mouth every 6 (six) hours as needed.     albuterol (VENTOLIN HFA) 108 (90 Base) MCG/ACT inhaler Inhale 2 puffs into the lungs every 6 (six) hours as needed for wheezing or shortness of breath. 8 g 0   amphetamine-dextroamphetamine (ADDERALL XR) 20 MG 24 hr capsule Take 20 mg by mouth daily as needed.     Cholecalciferol (VITAMIN D3) 125 MCG (5000 UT) CAPS Take 2 capsules by mouth in the morning and at bedtime. 2 in AM & 2 in PM. (10,000iu /2xday).     clonazePAM (KLONOPIN) 1 MG tablet Take 1 tablet (1 mg total) by mouth 2 (two) times daily as needed for anxiety. 60 tablet 0   cyclobenzaprine (FLEXERIL) 5 MG tablet Take ONE tab PO BID/TID PRN     DULoxetine (CYMBALTA) 60 MG capsule Take 1 capsule (60 mg total) by mouth 2 (two) times daily. 180 capsule 0   EPINEPHrine 0.3 mg/0.3 mL IJ SOAJ injection Inject 0.3 mg into the muscle as needed for anaphylaxis. 1 each 2   ferrous sulfate 325 (65 FE) MG tablet Take 325 mg by mouth 2 (two) times daily with a meal.     gabapentin (NEURONTIN) 300 MG capsule TAKE 1 CAPSULE BY MOUTH THREE TIMES A DAY 90 capsule 2   hydrOXYzine (VISTARIL) 25 MG capsule Take 25 mg by mouth 3 (three) times daily as needed.     ibuprofen (ADVIL) 200 MG tablet Take 400 mg by mouth every 6 (six) hours as needed.     meloxicam (MOBIC) 15 MG tablet Take 15 mg by mouth daily.     omeprazole (PRILOSEC) 20 MG capsule Take 20 mg by mouth daily.     ondansetron (ZOFRAN) 4 MG tablet Take 1 tablet (4 mg total) by mouth daily as needed for nausea or vomiting. 30 tablet 1   progesterone (PROMETRIUM) 200 MG capsule  Take 1 daily for 21 days every month 21 capsule 12   valACYclovir (VALTREX) 500 MG tablet Take 1 tablet (500 mg total) by mouth 2 (two) times daily. (Patient taking differently: Take 500 mg by mouth as needed.) 60 tablet 11   vitamin B-12 (CYANOCOBALAMIN) 500 MCG tablet Take 500 mcg by mouth daily.     No current facility-administered medications for this visit.    OBJECTIVE: Vitals:  04/29/23 1148  BP: (!) 108/58  Pulse: 75  Resp: 18  Temp: (!) 97.5 F (36.4 C)  SpO2: 98%     Body mass index is 39.45 kg/m.    ECOG FS:0 - Asymptomatic screening  General: Well-developed, well-nourished, no acute distress. Eyes: Pink conjunctiva, anicteric sclera. HEENT: Normocephalic, moist mucous membranes. Lungs: No audible wheezing or coughing. Heart: Regular rate and rhythm. Abdomen: Soft, nontender, no obvious distention. Musculoskeletal: No edema, cyanosis, or clubbing. Neuro: Alert, answering all questions appropriately. Cranial nerves grossly intact. Skin: No rashes or petechiae noted. Psych: Normal affect. Lymphatics: No cervical, calvicular, axillary or inguinal LAD.   LAB RESULTS:  Lab Results  Component Value Date   NA 139 04/15/2023   K 4.1 04/15/2023   CL 104 04/15/2023   CO2 28 04/15/2023   GLUCOSE 80 04/15/2023   BUN 17 04/15/2023   CREATININE 0.78 04/15/2023   CALCIUM 8.8 04/15/2023   PROT 7.3 04/15/2023   ALBUMIN 4.1 04/15/2023   AST 16 04/15/2023   ALT 11 04/15/2023   ALKPHOS 120 (H) 04/15/2023   BILITOT 0.4 04/15/2023   GFRNONAA >60 04/05/2023   GFRAA 121 04/09/2021    Lab Results  Component Value Date   WBC 6.7 04/05/2023   NEUTROABS 7.5 02/03/2022   HGB 10.9 (L) 04/05/2023   HCT 35.0 (L) 04/05/2023   MCV 90.9 04/05/2023   PLT 378 04/05/2023   Lab Results  Component Value Date   IRON 29 04/15/2023   TIBC 428 04/15/2023   IRONPCTSAT 7 (LL) 04/15/2023   Lab Results  Component Value Date   FERRITIN 4.4 (L) 04/15/2023     STUDIES: CUP  PACEART INCLINIC DEVICE CHECK  Result Date: 04/15/2023 Pacemaker check in clinic. Normal device function. Thresholds, sensing, impedances consistent with previous measurements. Device programmed to maximize longevity. Rare, brief AT/AF episodes noted. Device programmed at appropriate safety margins. Histogram distribution appropriate for patient activity level. Device programmed to optimize intrinsic conduction. Estimated longevity _12.6 years__. Patient enrolled in remote follow-up. Patient education completed.  CT Renal Stone Study  Result Date: 04/05/2023 CLINICAL DATA:  Abdominal/flank pain. EXAM: CT ABDOMEN AND PELVIS WITHOUT CONTRAST TECHNIQUE: Multidetector CT imaging of the abdomen and pelvis was performed following the standard protocol without IV contrast. RADIATION DOSE REDUCTION: This exam was performed according to the departmental dose-optimization program which includes automated exposure control, adjustment of the mA and/or kV according to patient size and/or use of iterative reconstruction technique. COMPARISON:  CT abdomen dated 11/10/2022. FINDINGS: Lower chest: No acute abnormality. Hepatobiliary: No focal liver abnormality is seen. Gallbladder is unremarkable. No bile duct dilatation. Pancreas: Unremarkable. No pancreatic ductal dilatation or surrounding inflammatory changes. Spleen: Normal in size without focal abnormality. Adrenals/Urinary Tract: Adrenal glands appear normal. Kidneys are unremarkable without stone or hydronephrosis. No ureteral or bladder calculi are identified. Bowel is decompressed, limiting characterization. Stomach/Bowel: No dilated large or small bowel loops are appreciated. No evidence of bowel wall inflammation. Appendix appears normal. Surgical changes compatible with previous gastric bypass procedure. No obstruction or other complicating feature is identified. Vascular/Lymphatic: No abdominal aortic aneurysm. No enlarged lymph nodes are identified. Reproductive:  Uterus and bilateral adnexa are unremarkable. Other: No free fluid or abscess collection is seen. No free intraperitoneal air. Musculoskeletal: Degenerative spondylosis at the L4-5 and L5-S1 levels, mild to moderate in degree with associated disc space narrowings and mild osseous spurring. No acute-appearing osseous abnormality. IMPRESSION: 1. No acute findings within the abdomen or pelvis. No renal or ureteral calculi are identified.  No bowel obstruction or evidence of bowel wall inflammation. Appendix appears normal. 2. Surgical changes compatible with previous gastric bypass procedure. No obstruction or other complicating feature is identified. 3. Degenerative spondylosis at the L4-5 and L5-S1 levels, mild to moderate in degree. Electronically Signed   By: Bary Richard M.D.   On: 04/05/2023 14:20    ASSESSMENT: Iron deficiency anemia.  PLAN:    Iron deficiency anemia: Likely secondary to poor absorption from history of gastric bypass.  Patient reports an appointment with GI in the near future for consideration of EGD and colonoscopy.  Patient has a decreased hemoglobin and iron stores and will benefit from IV Venofer.  Return to clinic 5 times over the next 2 to 3 weeks to receive 200 mg IV Venofer.  Patient will then return to clinic in 4 months with repeat laboratory work, further evaluation, and continuation of treatment if needed.  I spent a total of 45 minutes reviewing chart data, face-to-face evaluation with the patient, counseling and coordination of care as detailed above.   Patient expressed understanding and was in agreement with this plan. She also understands that She can call clinic at any time with any questions, concerns, or complaints.    Jeralyn Ruths, MD   04/29/2023 1:01 PM

## 2023-05-01 ENCOUNTER — Ambulatory Visit: Payer: No Typology Code available for payment source | Admitting: Neurology

## 2023-05-01 DIAGNOSIS — G4733 Obstructive sleep apnea (adult) (pediatric): Secondary | ICD-10-CM | POA: Diagnosis not present

## 2023-05-01 DIAGNOSIS — G4719 Other hypersomnia: Secondary | ICD-10-CM

## 2023-05-01 DIAGNOSIS — Z820 Family history of epilepsy and other diseases of the nervous system: Secondary | ICD-10-CM

## 2023-05-01 DIAGNOSIS — R351 Nocturia: Secondary | ICD-10-CM

## 2023-05-01 DIAGNOSIS — E669 Obesity, unspecified: Secondary | ICD-10-CM

## 2023-05-01 DIAGNOSIS — Z9884 Bariatric surgery status: Secondary | ICD-10-CM

## 2023-05-01 DIAGNOSIS — R519 Headache, unspecified: Secondary | ICD-10-CM

## 2023-05-01 DIAGNOSIS — R0683 Snoring: Secondary | ICD-10-CM

## 2023-05-01 DIAGNOSIS — G4720 Circadian rhythm sleep disorder, unspecified type: Secondary | ICD-10-CM

## 2023-05-02 ENCOUNTER — Ambulatory Visit (INDEPENDENT_AMBULATORY_CARE_PROVIDER_SITE_OTHER): Payer: No Typology Code available for payment source | Admitting: Internal Medicine

## 2023-05-02 ENCOUNTER — Inpatient Hospital Stay: Payer: BLUE CROSS/BLUE SHIELD

## 2023-05-02 ENCOUNTER — Encounter: Payer: Self-pay | Admitting: Internal Medicine

## 2023-05-02 VITALS — BP 126/70 | HR 78 | Ht 60.0 in | Wt 199.6 lb

## 2023-05-02 DIAGNOSIS — R143 Flatulence: Secondary | ICD-10-CM | POA: Diagnosis not present

## 2023-05-02 DIAGNOSIS — D509 Iron deficiency anemia, unspecified: Secondary | ICD-10-CM

## 2023-05-02 DIAGNOSIS — R194 Change in bowel habit: Secondary | ICD-10-CM | POA: Diagnosis not present

## 2023-05-02 DIAGNOSIS — Z9884 Bariatric surgery status: Secondary | ICD-10-CM

## 2023-05-02 MED ORDER — NA SULFATE-K SULFATE-MG SULF 17.5-3.13-1.6 GM/177ML PO SOLN: 1.0000 | Freq: Once | ORAL | 0 refills | Status: AC

## 2023-05-02 NOTE — Patient Instructions (Signed)
You have been scheduled for an endoscopy and colonoscopy. Please follow the written instructions given to you at your visit today. Please pick up your prep supplies at the pharmacy within the next 1-3 days. If you use inhalers (even only as needed), please bring them with you on the day of your procedure.   _______________________________________________________  If your blood pressure at your visit was 140/90 or greater, please contact your primary care physician to follow up on this.  _______________________________________________________  If you are age 47 or older, your body mass index should be between 23-30. Your Body mass index is 38.98 kg/m. If this is out of the aforementioned range listed, please consider follow up with your Primary Care Provider.  If you are age 59 or younger, your body mass index should be between 19-25. Your Body mass index is 38.98 kg/m. If this is out of the aformentioned range listed, please consider follow up with your Primary Care Provider.   ________________________________________________________  The [facility] providers would like to encourage you to use [facility] to communicate with providers for non-urgent requests or questions.  Due to long hold times on the telephone, sending your provider a message by [facility] may be a faster and more efficient way to get a response.  Please allow 48 business hours for a response.  Please remember that this is for non-urgent requests.  _______________________________________________________

## 2023-05-02 NOTE — Progress Notes (Signed)
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  The patient is status post gastric bypass surgery remotely.  Successful but not ideal weight reduction.  Recently found to have iron deficiency anemia.  Scheduled for IV iron replacement.  Describes alternating bowel habits.  Also describes abdominal pain and foul-smelling gas.  Last colonoscopy performed in [place] September 01, 2015.  The terminal ileum was normal.  She was noted to have left-sided diverticulosis.  As well, hemorrhoids.  Follow-up in 10 years recommended.  Patient also reports some nausea for which she was given Zofran.  She describes a sensation that after eating things "just do not sit right".  She has had some weight gain.  She had been on meloxicam, but no longer.  Review of blood work from April 15, 2023 shows unremarkable comprehensive metabolic panel.  Iron studies confirm iron deficiency with ferritin of 4.4 and iron saturation of 7%.  Hemoglobin from Apr 05, 2023 was 10.9.  She was seen in the emergency room for flank pain.  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    Mental disorder    Rheumatoid arthritis (HCC)    Sinus node dysfunction (HCC)    s/p pacemaker   Thrombocytosis     Vaginal Pap smear, abnormal     Past Surgical History:  Procedure Laterality Date   CESAREAN SECTION  06/02/94   GASTRIC BYPASS  2010   285lbs at highest weight   HYSTEROSCOPY WITH D & C  08/22/2019   JOINT REPLACEMENT  05/2019   Revision Right knee   OTHER SURGICAL HISTORY  08/2019   Bilateral fallopian tube removal   PACEMAKER PLACEMENT  08/06/2020   REVISION TOTAL KNEE ARTHROPLASTY Right 06/01/2019   SHOULDER ARTHROSCOPY Left 2018   TUBAL LIGATION Bilateral 08/07/2019    Social History Diana Giles  reports that she has never smoked. She has been exposed to tobacco smoke. She has never used smokeless tobacco. She reports that she does not currently use alcohol. She reports that she does not use drugs.  family history includes ADD / ADHD in her son; Anxiety disorder in her brother; Asthma in her brother, brother, brother, brother, mother, sister, and son; COPD in her mother, sister, and sister; Colon cancer in her cousin; Diabetes in her brother and father; Early death in her father and mother; Heart disease in her father; Hypertension in her brother and mother; Hyperthyroidism in her sister; Kidney disease in her father; Miscarriages / Stillbirths in her sister; Obesity in her brother, brother, and son; Polycystic ovary syndrome in her daughter; Stroke in her mother.  Allergies  Allergen Reactions   Nickel Other (See Comments)   Penicillins  Other Reaction(s): Not available   Shellfish Allergy    Sulfa Antibiotics Rash       PHYSICAL EXAMINATION: Vital signs: BP 126/70   Pulse 78   Ht 5' (1.524 m)   Wt 199 lb 9.6 oz (90.5 kg)   SpO2 96%   BMI 38.98 kg/m   Constitutional: generally well-appearing, no acute distress Psychiatric: alert and oriented x3, cooperative Eyes: extraocular movements intact, anicteric, conjunctiva pink Mouth: oral pharynx moist, no lesions Neck: supple no lymphadenopathy Cardiovascular: heart regular rate and rhythm, no murmur Lungs:  clear to auscultation bilaterally Abdomen: soft, nontender, nondistended, no obvious ascites, no peritoneal signs, normal bowel sounds, no organomegaly Rectal: Deferred until colonoscopy Extremities: no clubbing, cyanosis, or lower extremity edema bilaterally Skin: no lesions on visible extremities Neuro: No focal deficits.  Cranial nerves intact  ASSESSMENT:  1.  Postprandial abdominal discomfort, bloating, increased intestinal gas.  On omeprazole 20 mg daily. 2.  History of gastric bypass.  Rule out bacterial overgrowth 3.  Change in bowel habits with alternating pattern 4.  Iron deficiency anemia likely due to poor iron absorption post gastric bypass.  Rule out GI mucosal lesion 5.  Colonoscopy in  with left-sided diverticulosis   PLAN:  1.  Schedule upper endoscopy to evaluate upper GI complaints and iron deficiency anemia.The nature of the procedure, as well as the risks, benefits, and alternatives were carefully and thoroughly reviewed with the patient. Ample time for discussion and questions allowed. The patient understood, was satisfied, and agreed to proceed. 2.  Schedule colonoscopy to evaluate lower GI complaints and iron deficiency anemia.The nature of the procedure, as well as the risks, benefits, and alternatives were carefully and thoroughly reviewed with the patient. Ample time for discussion and questions allowed. The patient understood, was satisfied, and agreed to proceed. 3.  Continue iron replacement therapy with supervising physician 4.  Consider empiric trial of metronidazole for possible bacterial overgrowth in this postoperative patient A total time of 60 minutes was spent preparing to see the patient, reviewing a myriad of data and outside evaluations, obtaining comprehensive history, performing medically appropriate physical exam, counseling and educating the patient regarding the above listed issues, ordering multiple endoscopic procedures, and  documenting clinical information in the health record

## 2023-05-02 NOTE — Telephone Encounter (Signed)
Forms faxed and voice mail left for pt to drop by the office to be pick up forms

## 2023-05-02 NOTE — Telephone Encounter (Signed)
Patient has picked up forms.

## 2023-05-02 NOTE — Procedures (Signed)
Physician Interpretation:     [facility] POLYSOMNOGRAPHY  INTERPRETATION REPORT   STUDY DATE:  05/01/2023     PATIENT NAME:  Diana Giles         DATE OF BIRTH:  December 01, 1970  PATIENT ID:  [id number]    TYPE OF STUDY:  PSG  READING PHYSICIAN: Huston Foley, MD, PhD   SCORING TECHNICIAN: Domingo Cocking, RPSGT   Referred by: [name], NP  ? History and Indication for Testing: 52 year old female with an underlying medical history of rheumatoid arthritis, degenerative lumbar disc disease, anxiety, avascular necrosis of the hip, fibromyalgia, sinus node dysfunction with status post pacemaker placement, thrombocytosis, and obesity with status post bariatric surgery, who reports snoring and excessive daytime somnolence as well as nocturia. Her Epworth sleepiness score is 19/24, fatigue severity score is 50 out of 63.  Height: 60 in Weight: 192 lb (BMI 37) Neck Size: 14 in   MEDICATIONS: Tylenol, Adderall XR, Vitamin D3, Klonopin, Cymbalta, EpiPen, Neurontin, Vistaril, Advil, Mobic, Zofran, Prometrium, Valtrex, Vitamin B12   TECHNICAL DESCRIPTION: A registered sleep technologist was in attendance for the duration of the recording.  Data collection, scoring, video monitoring, and reporting were performed in compliance with the AASM Manual for the Scoring of Sleep and Associated Events; (Hypopnea is scored based on the criteria listed in Section VIII D. 1b in the AASM Manual V2.6 using a 4% oxygen desaturation rule or Hypopnea is scored based on the criteria listed in Section VIII D. 1a in the AASM Manual V2.6 using 3% oxygen desaturation and /or arousal rule).   SLEEP CONTINUITY AND SLEEP ARCHITECTURE:  Lights-out was at 21:59: and lights-on at  05:00:, with a total recording time of 7 hours, 0.5 min. Total sleep time ( TST) was 355.0 minutes with a decreased sleep efficiency at 84.4%. There was  4.2% REM sleep.    BODY POSITION:  TST was divided  between the  following sleep positions: 32.8% supine;  67.2% lateral;  0% prone. Duration of total sleep and percent of total sleep in their respective position is as follows: supine 116 minutes (33%), non-supine 239 minutes (67%); right 161 minutes (45%), left 77 minutes (22%), and prone 00 minutes (0%).  Total supine REM sleep time was 00 minutes (0% of total REM sleep).  Sleep latency was increased at 49.0 minutes.  REM sleep latency was markedly increased at 262.0 minutes. Of the total sleep time, the percentage of stage N1 sleep was 5.5%, stage N2 sleep was 89%, which is markedly increased, stage N3 sleep was near absent at 1.5%, and REM sleep was 4.2%, which is markedly reduced. Wake after sleep onset (WASO) time accounted for 16.5 min with minimal to mild sleep fragmentation noted.   RESPIRATORY MONITORING:  Based on CMS criteria (using a 4% oxygen desaturation rule for scoring hypopneas), there were 11 apneas (11 obstructive; 0 central; 0 mixed), and 51 hypopneas.  Apnea index was 1.9. Hypopnea index was 8.6. The apnea-hypopnea index was 10.5 overall (9.3 supine, 0 non-supine; 0.0 REM, 0.0 supine REM). There were 0 respiratory effort-related arousals (RERAs).  The RERA index was 0 events/h. Total respiratory disturbance index (RDI) was 10.5 events/h. RDI results showed: supine RDI  9.3 /h; non-supine RDI 11.1 /h; REM RDI 0.0 /h, supine REM RDI 0.0 /h.   Based on AASM criteria (using a 3% oxygen desaturation and /or arousal rule for scoring hypopneas), there were 11 apneas (11 obstructive; 0 central; 0 mixed), and 60 hypopneas. Apnea  index was 1.9. Hypopnea index was 10.1. The apnea-hypopnea index was 12.0 overall (9.3 supine, 0 non-supine; 0.0 REM, 0.0 supine REM).  There were 0 respiratory effort-related arousals (RERAs).  The RERA index was 0 events/h. Total respiratory disturbance index (RDI) was 12.0 events/h. RDI results showed: supine RDI  9.3 /h; non-supine RDI 13.3 /h; REM RDI 0.0 /h, supine REM RDI 0.0  /h.    OXIMETRY: Oxyhemoglobin Saturation Nadir during sleep was at  77% from a mean of 93%.  Of the Total sleep time (TST)   hypoxemia (=<88%) was present for  12.3 minutes, or 3.5% of total sleep time.   LIMB MOVEMENTS: There were 0 periodic limb movements of sleep (0.0/hr), of which 0 (0.0/hr) were associated with an arousal.   AROUSAL: There were 44 arousals in total, for an arousal index of 7 arousals/hour.  Of these, 12 were identified as respiratory-related arousals (2 /h), 0 were PLM-related arousals (0 /h), and 42 were non-specific arousals (7 /h).   EEG: Review of the EEG showed no abnormal electrical discharges and symmetrical bihemispheric findings.    EKG: The EKG revealed normal sinus rhythm (NSR). The average heart rate during sleep was 65 bpm.   AUDIO/VIDEO REVIEW: The audio and video review did not show any abnormal or unusual behaviors, movements, phonations or vocalizations. The patient took no restroom breaks. Snoring was noted, and was intermittent in the mild to moderate range.  POST-STUDY QUESTIONNAIRE: Post study, the patient indicated, that sleep was the same as usual.   IMPRESSION:   1. Obstructive Sleep Apnea (OSA) 2. Dysfunctions associated with sleep stages or arousal from sleep   RECOMMENDATIONS:   1. This study demonstrates overall mild obstructive sleep apnea, with a total AHI of 12/hour, and O2 nadir of 77%. Given the patient's medical history and sleep related complaints, treatment with positive airway pressure is recommended; this can be achieved in the form of autoPAP. Alternatively, a full-night CPAP titration study would allow optimization of therapy if needed.  This study was somewhat limited secondary to a reduced percentage of REM sleep and absence of supine REM sleep. Other treatment options may include avoidance of supine sleep position along with weight loss, or the use of an oral appliance in selected patients. Please note, that untreated  obstructive sleep apnea may carry additional perioperative morbidity. Patients with significant obstructive sleep apnea should receive perioperative PAP therapy and the surgeons and particularly the anesthesiologist should be informed of the diagnosis and the severity of the sleep disordered breathing. 2. This study shows significant sleep fragmentation and abnormal sleep stage percentages; these are nonspecific findings and per se do not signify an intrinsic sleep disorder or a cause for the patient's sleep-related symptoms. Causes include (but are not limited to) the first night effect of the sleep study, circadian rhythm disturbances, medication effect or an underlying mood disorder or medical problem.  3. The patient should be cautioned not to drive, work at heights, or operate dangerous or heavy equipment when tired or sleepy. Review and reiteration of good sleep hygiene measures should be pursued with any patient. 4. The patient will be seen in follow-up by Dr. Frances Furbish at [facility] for discussion of the test results and further management strategies. The referring provider will be notified of the test results.   I certify that I have reviewed the entire raw data recording prior to the issuance of this report in accordance with the Standards of Accreditation of the American Academy of Sleep Medicine (AASM).  Huston Foley, MD, PhD Medical Director, [facility]) Diplomat, ABPN (Neurology and Sleep)               Technical Report:   General Information  Name: Diana Giles, Diana Giles BMI: 37.50 Physician: Huston Foley, MD  ID: [id number] Height: 60.0 in Technician: Domingo Cocking, RPSGT  Sex: Female Weight: 192.0 lb Record: [id number]  Age: 12 [10-28-1971] Date: 05/01/2023    Medical & Medication History    Ms. Kinlaw is a 52 year old female with an underlying medical history of rheumatoid arthritis, degenerative lumbar disc disease, anxiety, avascular  necrosis of the hip, fibromyalgia, sinus node dysfunction with status post pacemaker placement, thrombocytosis, and obesity with status post bariatric surgery, who reports snoring and excessive daytime somnolence as well as nocturia. I reviewed your office note from 08/05/2022. Her Epworth sleepiness score is 24, fatigue severity score is 50 out of 63.  Tylenol, Adderall XR, Vitamin D3, Klonopin, Cymbalta, EpiPen, Neurontin, Vistaril, Advil, Mobic, Zofran, Prometrium, Valtrex, Vitamin B12   Sleep Disorder      Comments   Patient arrived for a diagnostic polysomnogram. Procedure explained and all questions answered. Standard paste setup without complications. Patient slept supine, left, and right. Mild to moderate intermittent snoring heard. Respiratory events observed. Cardiac arrhythmias observed, patient has a known cardiac history, having a pacemaker. No significant PLMS observed. No restroom visit.     Lights out: 09:59:42 PM Lights on: 05:00:27 AM   Time Total Supine Side Prone Upright  Recording (TRT) 7h 0.74m 2h 38.5m 4h 22.8m 0h 0.71m 0h 0.60m  Sleep (TST) 5h 55.84m 1h 56.17m 3h 58.10m 0h 0.57m 0h 0.73m   Latency N1 N2 N3 REM Onset Per. Slp. Eff.  Actual 0h 0.1m 0h 3.82m 0h 47.26m 4h 22.32m 0h 49.82m 0h 52.68m 84.42%   Stg Dur Wake N1 N2 N3 REM  Total 65.5 19.5 315.0 5.5 15.0  Supine 42.0 1.0 115.5 0.0 0.0  Side 23.5 18.5 199.5 5.5 15.0  Prone 0.0 0.0 0.0 0.0 0.0  Upright 0.0 0.0 0.0 0.0 0.0   Stg % Wake N1 N2 N3 REM  Total 15.6 5.5 88.7 1.5 4.2  Supine 10.0 0.3 32.5 0.0 0.0  Side 5.6 5.2 56.2 1.5 4.2  Prone 0.0 0.0 0.0 0.0 0.0  Upright 0.0 0.0 0.0 0.0 0.0     Apnea Summary Sub Supine Side Prone Upright  Total 11 Total 11 8 3  0 0    REM 0 0 0 0 0    NREM 11 8 3  0 0  Obs 11 REM 0 0 0 0 0    NREM 11 8 3  0 0  Mix 0 REM 0 0 0 0 0    NREM 0 0 0 0 0  Cen 0 REM 0 0 0 0 0    NREM 0 0 0 0 0   Rera Summary Sub Supine Side Prone Upright  Total 0 Total 0 0 0 0 0    REM 0 0 0 0 0    NREM 0 0  0 0 0   Hypopnea Summary Sub Supine Side Prone Upright  Total 60 Total 60 10 50 0 0    REM 0 0 0 0 0    NREM 60 10 50 0 0   4% Hypopnea Summary Sub Supine Side Prone Upright  Total (4%) 51 Total 51 10 41 0 0    REM 0 0 0 0 0    NREM 51 10 41 0 0  AHI Total Obs Mix Cen  12.00 Apnea 1.86 1.86 0.00 0.00   Hypopnea 10.14 -- -- --  10.48 Hypopnea (4%) 8.62 -- -- --    Total Supine Side Prone Upright  Position AHI 12.00 9.27 13.33 0.00 0.00  REM AHI 0.00   NREM AHI 12.53   Position RDI 12.00 9.27 13.33 0.00 0.00  REM RDI 0.00   NREM RDI 12.53    4% Hypopnea Total Supine Side Prone Upright  Position AHI (4%) 10.48 9.27 11.07 0.00 0.00  REM AHI (4%) 0.00   NREM AHI (4%) 10.94   Position RDI (4%) 10.48 9.27 11.07 0.00 0.00  REM RDI (4%) 0.00   NREM RDI (4%) 10.94    Desaturation Information Threshold: 2% <100% <90% <80% <70% <60% <50% <40%  Supine 115.0 49.0 1.0 0.0 0.0 0.0 0.0  Side 177.0 43.0 0.0 0.0 0.0 0.0 0.0  Prone 0.0 0.0 0.0 0.0 0.0 0.0 0.0  Upright 0.0 0.0 0.0 0.0 0.0 0.0 0.0  Total 292.0 92.0 1.0 0.0 0.0 0.0 0.0  Index 47.2 14.9 0.2 0.0 0.0 0.0 0.0   Threshold: 3% <100% <90% <80% <70% <60% <50% <40%  Supine 83.0 45.0 1.0 0.0 0.0 0.0 0.0  Side 134.0 43.0 0.0 0.0 0.0 0.0 0.0  Prone 0.0 0.0 0.0 0.0 0.0 0.0 0.0  Upright 0.0 0.0 0.0 0.0 0.0 0.0 0.0  Total 217.0 88.0 1.0 0.0 0.0 0.0 0.0  Index 35.0 14.2 0.2 0.0 0.0 0.0 0.0   Threshold: 4% <100% <90% <80% <70% <60% <50% <40%  Supine 64.0 35.0 1.0 0.0 0.0 0.0 0.0  Side 111.0 42.0 0.0 0.0 0.0 0.0 0.0  Prone 0.0 0.0 0.0 0.0 0.0 0.0 0.0  Upright 0.0 0.0 0.0 0.0 0.0 0.0 0.0  Total 175.0 77.0 1.0 0.0 0.0 0.0 0.0  Index 28.3 12.4 0.2 0.0 0.0 0.0 0.0   Threshold: 3% <100% <90% <80% <70% <60% <50% <40%  Supine 83 45 1 0 0 0 0  Side 134 43 0 0 0 0 0  Prone 0 0 0 0 0 0 0  Upright 0 0 0 0 0 0 0  Total 217 88 1 0 0 0 0   Awakening/Arousal Information # of Awakenings 15  Wake after sleep onset 16.17m  Wake after  persistent sleep 15.70m   Arousal Assoc. Arousals Index  Apneas 5 0.8  Hypopneas 7 1.2  Leg Movements 1 0.2  Snore 0 0.0  PTT Arousals 0 0.0  Spontaneous 42 7.1  Total 55 9.3  Leg Movement Information PLMS LMs Index  Total LMs during PLMS 0 0.0  LMs w/ Microarousals 0 0.0   LM LMs Index  w/ Microarousal 1 0.2  w/ Awakening 1 0.2  w/ Resp Event 1 0.2  Spontaneous 4 0.7  Total 6 1.0     Desaturation threshold setting: 3% Minimum desaturation setting: 10 seconds SaO2 nadir: 59% The longest event was a 24 sec obstructive Apnea with a minimum SaO2 of 81%. The lowest SaO2 was 81% associated with a 24 sec obstructive Apnea. EKG Rates EKG Avg Max Min  Awake 64 92 54  Asleep 65 88 55  EKG Events: N/A

## 2023-05-02 NOTE — Addendum Note (Signed)
Addended by: Huston Foley on: 05/02/2023 07:09 PM   Modules accepted: Orders

## 2023-05-03 ENCOUNTER — Telehealth: Payer: Self-pay

## 2023-05-03 MED FILL — Iron Sucrose Inj 20 MG/ML (Fe Equiv): INTRAVENOUS | Qty: 10 | Status: AC

## 2023-05-03 NOTE — Telephone Encounter (Signed)
-----   Message from Huston Foley, MD sent at 05/02/2023  7:09 PM EDT ----- Patient referred by PCP, seen by me on 09/27/22, diagnostic PSG on 05/01/23.    Please call and notify the patient that the recent sleep study showed overall mild sleep apnea, but worth treating to see if she feels better after treatment (esp with regards to her sleepiness during the day). To that end I recommend treatment for this in the form of autoPAP, which means, that we don't have to bring her back for a second sleep study with CPAP, but will let him try an autoPAP machine at home, through a DME company (of her choice, or as per insurance requirement). The DME representative will educate her on how to use the machine, how to put the mask on, etc. I have placed an order in the chart. Please send referral, talk to patient, send report to referring MD. We will need a FU in sleep clinic for 10 weeks post-PAP set up, please arrange that with me or one of our NPs. Thanks,   Huston Foley, MD, PhD [facility])

## 2023-05-03 NOTE — Telephone Encounter (Signed)
I left a voicemail for the patient requesting a return call to review results.

## 2023-05-03 NOTE — Telephone Encounter (Signed)
I called Diana Giles. I advised Diana Giles that Dr. Frances Furbish reviewed their sleep study results and found that Diana Giles has mild sleep apnea. Dr. Frances Furbish recommends that Diana Giles start autoPAP. I reviewed PAP compliance expectations with the Diana Giles. Diana Giles is agreeable to starting a CPAP. I advised Diana Giles that an order will be sent to a DME, [employer], and [employer] will call the Diana Giles within about one week after they file with the Diana Giles's insurance. [employer] will show the Diana Giles how to use the machine, fit for masks, and troubleshoot the CPAP if needed. A follow up appt was made for insurance purposes with Dr. Frances Furbish on 07/14/23. Diana Giles verbalized understanding to arrive 15 minutes early and bring their CPAP. Diana Giles verbalized understanding of results. Diana Giles had no questions at this time but was encouraged to call back if questions arise. I have sent the order to [employer].

## 2023-05-04 ENCOUNTER — Inpatient Hospital Stay: Payer: BLUE CROSS/BLUE SHIELD

## 2023-05-04 VITALS — BP 145/74 | HR 99 | Temp 97.9°F | Resp 18

## 2023-05-04 DIAGNOSIS — D509 Iron deficiency anemia, unspecified: Secondary | ICD-10-CM

## 2023-05-04 MED ORDER — SODIUM CHLORIDE 0.9 % IV SOLN
Freq: Once | INTRAVENOUS | Status: AC
  Filled 2023-05-04: qty 250

## 2023-05-04 MED ORDER — SODIUM CHLORIDE 0.9 % IV SOLN
200.0000 mg | Freq: Once | INTRAVENOUS | Status: AC
  Administered 2023-05-04: 200 mg via INTRAVENOUS
  Filled 2023-05-04: qty 200

## 2023-05-06 ENCOUNTER — Inpatient Hospital Stay: Payer: BLUE CROSS/BLUE SHIELD

## 2023-05-06 ENCOUNTER — Ambulatory Visit: Payer: PRIVATE HEALTH INSURANCE

## 2023-05-06 ENCOUNTER — Ambulatory Visit (INDEPENDENT_AMBULATORY_CARE_PROVIDER_SITE_OTHER): Payer: No Typology Code available for payment source

## 2023-05-06 VITALS — BP 90/67 | HR 81 | Temp 98.3°F | Resp 18

## 2023-05-06 DIAGNOSIS — D509 Iron deficiency anemia, unspecified: Secondary | ICD-10-CM

## 2023-05-06 DIAGNOSIS — I495 Sick sinus syndrome: Secondary | ICD-10-CM | POA: Diagnosis not present

## 2023-05-06 LAB — CUP PACEART REMOTE DEVICE CHECK
Battery Remaining Longevity: 151 mo
Battery Voltage: 3.03 V
Brady Statistic AP VP Percent: 0.02 %
Brady Statistic AP VS Percent: 6.38 %
Brady Statistic AS VP Percent: 0.03 %
Brady Statistic AS VS Percent: 93.57 %
Brady Statistic RA Percent Paced: 6.42 %
Brady Statistic RV Percent Paced: 0.05 %
Date Time Interrogation Session: 20240628005936
Implantable Lead Connection Status: 753985
Implantable Lead Connection Status: 753985
Implantable Lead Implant Date: 20210928
Implantable Lead Implant Date: 20210928
Implantable Lead Location: 753859
Implantable Lead Location: 753860
Implantable Lead Model: 4076
Implantable Lead Model: 4076
Implantable Pulse Generator Implant Date: 20210928
Lead Channel Impedance Value: 285 Ohm
Lead Channel Impedance Value: 361 Ohm
Lead Channel Impedance Value: 437 Ohm
Lead Channel Impedance Value: 456 Ohm
Lead Channel Pacing Threshold Amplitude: 0.5 V
Lead Channel Pacing Threshold Amplitude: 0.75 V
Lead Channel Pacing Threshold Pulse Width: 0.4 ms
Lead Channel Pacing Threshold Pulse Width: 0.4 ms
Lead Channel Sensing Intrinsic Amplitude: 0.875 mV
Lead Channel Sensing Intrinsic Amplitude: 0.875 mV
Lead Channel Sensing Intrinsic Amplitude: 6.125 mV
Lead Channel Sensing Intrinsic Amplitude: 6.125 mV
Lead Channel Setting Pacing Amplitude: 1.5 V
Lead Channel Setting Pacing Amplitude: 2 V
Lead Channel Setting Pacing Pulse Width: 0.4 ms
Lead Channel Setting Sensing Sensitivity: 0.9 mV
Zone Setting Status: 755011

## 2023-05-06 MED ORDER — SODIUM CHLORIDE 0.9 % IV SOLN
200.0000 mg | Freq: Once | INTRAVENOUS | Status: AC
  Administered 2023-05-06: 200 mg via INTRAVENOUS
  Filled 2023-05-06: qty 200

## 2023-05-06 MED ORDER — SODIUM CHLORIDE 0.9 % IV SOLN
Freq: Once | INTRAVENOUS | Status: AC
  Filled 2023-05-06: qty 250

## 2023-05-06 NOTE — Progress Notes (Signed)
Pt has been educated and understands. Pt declined to stay 30 mins after iron infusion. VSS.  

## 2023-05-09 ENCOUNTER — Inpatient Hospital Stay: Payer: BLUE CROSS/BLUE SHIELD | Attending: Oncology

## 2023-05-09 VITALS — BP 106/73 | HR 77 | Temp 97.2°F | Resp 18

## 2023-05-09 DIAGNOSIS — D509 Iron deficiency anemia, unspecified: Secondary | ICD-10-CM | POA: Diagnosis not present

## 2023-05-09 MED ORDER — SODIUM CHLORIDE 0.9 % IV SOLN
200.0000 mg | Freq: Once | INTRAVENOUS | Status: AC
  Administered 2023-05-09: 200 mg via INTRAVENOUS
  Filled 2023-05-09: qty 200

## 2023-05-09 MED ORDER — SODIUM CHLORIDE 0.9 % IV SOLN
Freq: Once | INTRAVENOUS | Status: AC
  Filled 2023-05-09: qty 250

## 2023-05-09 NOTE — Patient Instructions (Signed)

## 2023-05-13 ENCOUNTER — Other Ambulatory Visit: Payer: Self-pay | Admitting: Physical Medicine and Rehabilitation

## 2023-05-13 ENCOUNTER — Ambulatory Visit: Payer: PRIVATE HEALTH INSURANCE | Attending: Physical Medicine and Rehabilitation

## 2023-05-13 ENCOUNTER — Inpatient Hospital Stay: Payer: BLUE CROSS/BLUE SHIELD

## 2023-05-13 VITALS — BP 112/66 | HR 92 | Temp 99.0°F | Resp 16

## 2023-05-13 DIAGNOSIS — M25551 Pain in right hip: Secondary | ICD-10-CM | POA: Diagnosis present

## 2023-05-13 DIAGNOSIS — M25561 Pain in right knee: Secondary | ICD-10-CM | POA: Insufficient documentation

## 2023-05-13 DIAGNOSIS — M5459 Other low back pain: Secondary | ICD-10-CM | POA: Insufficient documentation

## 2023-05-13 DIAGNOSIS — D509 Iron deficiency anemia, unspecified: Secondary | ICD-10-CM

## 2023-05-13 DIAGNOSIS — M5431 Sciatica, right side: Secondary | ICD-10-CM | POA: Insufficient documentation

## 2023-05-13 DIAGNOSIS — G8929 Other chronic pain: Secondary | ICD-10-CM | POA: Insufficient documentation

## 2023-05-13 MED ORDER — SODIUM CHLORIDE 0.9 % IV SOLN
200.0000 mg | Freq: Once | INTRAVENOUS | Status: AC
  Administered 2023-05-13: 200 mg via INTRAVENOUS
  Filled 2023-05-13: qty 200

## 2023-05-13 MED ORDER — SODIUM CHLORIDE 0.9 % IV SOLN
Freq: Once | INTRAVENOUS | Status: AC
  Filled 2023-05-13: qty 250

## 2023-05-13 NOTE — Therapy (Signed)
OUTPATIENT PHYSICAL THERAPY THORACOLUMBAR TREATMENT/RECERT     Patient Name: Diana Giles MRN: [id number] DOB:27-May-1971, 52 y.o., female Today's Date: 05/13/2023   END OF SESSION:  PT End of Session - 05/13/23 0950     Visit Number 13    Number of Visits 17    Date for PT Re-Evaluation 06/10/23    Authorization Type [employer]    PT Start Time 0945    PT Stop Time 1028    PT Time Calculation (min) 43 min    Activity Tolerance Patient tolerated treatment well    Behavior During Therapy WFL for tasks assessed/performed                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    Mental disorder    Rheumatoid arthritis (HCC)    Sinus node dysfunction (HCC)    s/p pacemaker   Thrombocytosis    Vaginal Pap smear, abnormal    Past Surgical History:  Procedure Laterality Date   CESAREAN SECTION  06/02/94   GASTRIC BYPASS  2010   285lbs at highest weight   HYSTEROSCOPY WITH D & C  08/22/2019   JOINT REPLACEMENT  05/2019   Revision Right knee   OTHER SURGICAL HISTORY  08/2019   Bilateral fallopian tube removal   PACEMAKER PLACEMENT  08/06/2020   REVISION TOTAL KNEE ARTHROPLASTY Right 06/01/2019   SHOULDER ARTHROSCOPY Left 2018   TUBAL LIGATION Bilateral 08/07/2019   Patient Active Problem List   Diagnosis Date Noted   Iron deficiency anemia, unspecified 04/29/2023   Anemia 04/15/2023   Abnormal TSH 04/15/2023   Generalized abdominal pain 03/25/2023   Change in nail appearance 03/25/2023   Diabetes mellitus screening 03/25/2023   Encounter for lipid screening for cardiovascular disease 03/25/2023   Encounter for hepatitis C screening test for low risk  patient 03/25/2023   Encounter for screening mammogram for malignant neoplasm of breast 03/25/2023   COVID-19 11/26/2022   Chronic bilateral low back pain with right-sided sciatica 09/15/2022   Arthritis pain 09/15/2022   Fibromyalgia 09/15/2022   Chronic pain syndrome 09/15/2022   Bilateral sacroiliitis (HCC) 09/15/2022   Postmenopause 08/09/2022   Pacemaker 01/07/2021   Fatigue 11/13/2020   DDD (degenerative disc disease), lumbar 11/13/2020   Avascular necrosis of bone of hip, right (HCC) 11/13/2020   Sinus node dysfunction (HCC) 11/13/2020   Thrombocytosis 11/13/2020   Anxiety 11/13/2020   Attention deficit hyperactivity disorder (ADHD) 11/13/2020    PCP: [name], NP    REFERRING PROVIDER: Elijah Birk, DO  REFERRING DIAG: Chronic bilateral LBP with Rt sided sciatica; fibromyalgia; chronic pain syndrome; bilateral sacroiliitis  Rationale for Evaluation and Treatment: Rehabilitation  THERAPY DIAG:  Other low back pain  Pain in right hip  Sciatica, right side  Chronic pain of right knee  ONSET DATE:  SUBJECTIVE:  SUBJECTIVE STATEMENT: Pt reports pain in R lower back and leg is 4-5/10 NPS. Has been busy with Iron infusions, GI consults, and sleep study. Reports pain has slowly improved since last session.    EVAL: Pt states that she has been dealing with pain for atleast 20 years, but in the past 5 years its gotten worse with falls, injuries, etc. She was started on Gabapentin and feels this has helped her better pinpoint her pain. Pain is sacrum into the Rt buttock and lateral thigh down around the knee. She has numbness in the lateral foot. She has had PT several times in the past. She is interested in aquatic PT because it's the only thing she hasn't done to help the pain.  Pain is  often so bad that she can't get out of bed.   PERTINENT HISTORY:  Rt hip avascular necrosis of hip Anxiety  DDD Fibromyalgia  Rheumatoid Arthritis  Lt shoulder arthroplasty Rt total knee replacement and revision 2020 Pace maker   PAIN:  Are you having pain? Yes: NPRS scale: 4-5/10 Pain location: sacrum and Rt buttock, Rt LE/lateral knee Pain description: constant  Aggravating factors: sitting, supine, prolonged activity/walking  Relieving factors: avoiding prolonged activity   PRECAUTIONS: None  WEIGHT BEARING RESTRICTIONS: No  FALLS:  Has patient fallen in last 6 months? No  LIVING ENVIRONMENT: Lives with: lives with their family Lives in: House/apartment Stairs: No Has following equipment at home: None  OCCUPATION: Phlebotomist   PLOF: Independent  PATIENT GOALS: decrease pain  NEXT MD VISIT: f/u on MRI soon  OBJECTIVE:   DIAGNOSTIC FINDINGS:  MRI ordered   PATIENT SURVEYS:  Need next visit  SCREENING FOR RED FLAGS: Bowel or bladder incontinence: No Spinal tumors: No Cauda equina syndrome: No Compression fracture: No Abdominal aneurysm: No  COGNITION: Overall cognitive status: Within functional limits for tasks assessed     SENSATION: Pt notes numbness   MUSCLE LENGTH: Hamstrings: Right WNL deg; Left WNL deg Maisie Fus test: Right (+) Rt for quad and hip flexor  POSTURE: No Significant postural limitations  PALPATION: Tenderness Rt lateral gastroc/peroneals, Rt lateral quad/ITB, Rt glutes  LUMBAR ROM:   AROM eval  Flexion   Extension   Right lateral flexion   Left lateral flexion   Right rotation   Left rotation    (Blank rows = not tested)  LOWER EXTREMITY ROM:     Active  Right 04/29/23 Left 04/29/23  Hip flexion 110* 110  Hip extension    Hip abduction    Hip adduction    Hip internal rotation 41* 52  Hip external rotation 25 45  Knee flexion    Knee extension    Ankle dorsiflexion    Ankle plantarflexion    Ankle  inversion    Ankle eversion     (Blank rows = not tested)  LOWER EXTREMITY MMT:    MMT Right eval Left eval Right  04/22/23 Left  04/22/23  Hip flexion 4 4    Hip extension 3 4 3+   Hip abduction 3 4 4    Hip adduction      Hip internal rotation      Hip external rotation      Knee flexion 5 5    Knee extension 4 5    Ankle dorsiflexion      Ankle plantarflexion Heel raise unable Heel raise x6 Heel raise x6 Heel raise x7  Ankle inversion      Ankle eversion       (  Blank rows = not tested)  LUMBAR SPECIAL TESTS:  Next visit  FUNCTIONAL TESTS:  5 times sit to stand: 18 sec, weight shifted Lt  Timed up and go (TUG): 11 sec 6 MWT: 1,046'  GAIT: Distance walked: 66ft  Assistive device utilized: None Level of assistance: Complete Independence Comments: decreased stance on Rt, decreased step length on Lt, decreased push off on Rt    TODAY'S TREATMENT:   Date: 05/13/23  There.Ex: Nu-Step L3 for 5 min for warm up and interval history taking  3x10, 6# Med ball bridges  3x8, RTB hip ER isometric with glut bridge  Hook lying posterior pelvic tilts + alt marches: 3x8/LE  Side stepping for glut med strength: 6x10 meters  Standing bird dog with red physioball on abdomen: 2x8/side  Alternating 6" step ups with contralateral hip flexion to 90 deg: 3x8/side   PATIENT EDUCATION:  Education details: eval findings/POC; implemented HEP; benefits of aquatic PT; chronic pain response Person educated: Patient Education method: Explanation and Handouts Education comprehension: verbalized understanding and returned demonstration  HOME EXERCISE PROGRAM: Access Code: [email or web address] URL: [email or web address] Date: 04/22/2023 Prepared by: Ronnie Derby  Exercises - Modified Erby Pian  - 2 x daily - 7 x weekly - 2 sets - 20 seconds hold - Heel Raises with Counter Support with Brace On  - 3 x daily - 7 x weekly - 10 reps - Supine Bridge  - 1 x daily - 7 x weekly -  3 sets - 8 reps - Supine Posterior Pelvic Tilt  - 1 x daily - 7 x weekly - 2 sets - 20 reps - Supine March with Posterior Pelvic Tilt  - 1 x daily - 7 x weekly - 2 sets - 8 reps   Access Code: [email or web address] URL: [email or web address] Date: 03/04/2023 Prepared by: [facility]  Exercises - Modified Maisie Fus Stretch  - 2 x daily - 7 x weekly - 2 sets - 20 seconds hold - Heel Raises with Counter Support with Brace On  - 3 x daily - 7 x weekly - 10 reps  ASSESSMENT:  CLINICAL IMPRESSION: Pt arriving to PT with reduced pain levels. Continuing to tolerate progression in core/hip stability and standing exercises. Pt does demo difficulty with standing core stability exercises when spine is unsupported. Encouraged pt to continue aquatic PT along with land based. Pt appears overall with better energy levels likely due to recent iron infusions leading to improved tolerance for activity. Will continue to progress with standing core and LE therex to progress standing and gait tolerances for job tasks. Pt will continue to benefit from skilled therapy to address remaining deficits in order to improve overall QoL and return to PLOF.    OBJECTIVE IMPAIRMENTS: Abnormal gait, decreased activity tolerance, decreased balance, decreased endurance, decreased strength, impaired flexibility, and pain.   ACTIVITY LIMITATIONS: lifting, bending, sitting, standing, squatting, stairs, locomotion level, and caring for others  PARTICIPATION LIMITATIONS: cleaning, laundry, interpersonal relationship, shopping, community activity, occupation, and yard work  PERSONAL FACTORS: Age, Fitness, Past/current experiences, Time since onset of injury/illness/exacerbation, and 3+ comorbidities: Rt hip avascular necrosis, fibromyalgia, Rt TKA with revision  are also affecting patient's functional outcome.   REHAB POTENTIAL: Good  CLINICAL DECISION MAKING: Evolving/moderate  complexity  EVALUATION COMPLEXITY: Moderate   GOALS: Goals reviewed with patient? Yes  SHORT TERM GOALS: Target date: 03/11/23  Pt will be independent with her initial HEP to improve LE strength and flexibility. Baseline: ; 04/22/23: 75-85% compliance  Goal status: In progress    LONG TERM GOALS: Target date: 06/10/23  Pt will report atleast 40% improvement in her activity participation from the start of PT. Baseline:  ; 04/22/23: 20% improvement in activity for exercise (walking, playing with niece's kids) Goal status: In progress  2.  Pt will have improved hip strength to atleast 4/5 MMT which will increase efficiency with daily activity. Baseline: ; 04/22/23: hip extension 3+ Goal status: In progress   3.  Pt will have improved Rt hip flexibility evident by negative thomas test.  Baseline: ; 04/22/23: negative Goal status: MET  4.  Pt will have improved gastroc strength bilaterally, able to complete atleast 15 consecutive single leg heel raises. Baseline: 04/22/23: RLE: 6, LLE: 7  Goal status: In progress  5.  Pt will be independent with an advanced HEP to increase  Baseline: ;Deferred; incomplete written goal from eval paperwork Goal status: INITIAL  6. Pt will improve 6 MWT by at least 164' indicating clinically significant improvement in community ambulation and work distances.  Baseline: 04/29/23: 1,046' Goal Status: INITIAL   PLAN:  PT FREQUENCY: 2x/week  PT DURATION: 6 weeks  PLANNED INTERVENTIONS: Therapeutic exercises, Therapeutic activity, Neuromuscular re-education, Balance training, Gait training, Patient/Family education, Self Care, Joint mobilization, Aquatic Therapy, Dry Needling, Moist heat, Taping, Manual therapy, and Re-evaluation.  PLAN FOR NEXT SESSION: introduce more core stability exercises and add to HEP.   Delphia Grates. Fairly IV, PT, DPT Physical Therapist- [place]  [facility]  05/13/23, 11:03 AM

## 2023-05-19 ENCOUNTER — Telehealth: Payer: Self-pay | Admitting: *Deleted

## 2023-05-19 NOTE — Telephone Encounter (Signed)
Patient called back and said that part C of this form was not completed.  Where it says incapacitated time:  This was blank - this needs to be filled out because she is having an endoscopy and colonoscopy is going to be done this will need to say that  the patient will need a day or 2 off each week until November.  Please call patient with any questions.

## 2023-05-19 NOTE — Progress Notes (Signed)
Remote pacemaker transmission.   

## 2023-05-19 NOTE — Telephone Encounter (Signed)
Patient contacted the office and left message requesting a call back.   Returned call to patient and she states it is time to renew her intermittent FMLA forms. Patient would like to know if she can drop the forms off on Friday. I advised patient the office is open from 8:00 am to 12:00 pm on Friday. I advised patient to allow 7-10 business days to complete her forms and we would reach out to her once they were completed. Patient expressed understanding.

## 2023-05-20 ENCOUNTER — Inpatient Hospital Stay: Payer: BLUE CROSS/BLUE SHIELD

## 2023-05-20 VITALS — BP 110/52 | HR 92 | Temp 97.5°F | Resp 16

## 2023-05-20 DIAGNOSIS — D509 Iron deficiency anemia, unspecified: Secondary | ICD-10-CM

## 2023-05-20 MED ORDER — SODIUM CHLORIDE 0.9 % IV SOLN
200.0000 mg | Freq: Once | INTRAVENOUS | Status: AC
  Administered 2023-05-20: 200 mg via INTRAVENOUS
  Filled 2023-05-20: qty 200

## 2023-05-20 MED ORDER — SODIUM CHLORIDE 0.9 % IV SOLN
Freq: Once | INTRAVENOUS | Status: AC
  Filled 2023-05-20: qty 250

## 2023-05-24 NOTE — Progress Notes (Deleted)
Subjective:    Patient ID: Diana Giles, female    DOB: [birth year], 52 y.o.   MRN: [id number]  HPI   Pain Inventory Average Pain {NUMBERS; 0-10:5044} Pain Right Now {NUMBERS; 0-10:5044} My pain is {PAIN DESCRIPTION:21022940}  In the last 24 hours, has pain interfered with the following? General activity {NUMBERS; 0-10:5044} Relation with others {NUMBERS; 0-10:5044} Enjoyment of life {NUMBERS; 0-10:5044} What TIME of day is your pain at its worst? {time of day:24191} Sleep (in general) {BHH GOOD/FAIR/POOR:22877}  Pain is worse with: {ACTIVITIES:21022942} Pain improves with: {PAIN IMPROVES WUJW:11914782} Relief from Meds: {NUMBERS; 0-10:5044}  Family History  Problem Relation Age of Onset   Hypertension Mother    Stroke Mother    Asthma Mother    COPD Mother    Early death Mother    Diabetes Father    Heart disease Father    Early death Father    Kidney disease Father    Hyperthyroidism Sister    COPD Sister    Obesity Brother    Hypertension Brother    Asthma Brother    Anxiety disorder Brother    Asthma Brother    Obesity Brother    Diabetes Brother    Asthma Brother    Asthma Brother    Polycystic ovary syndrome Daughter    Asthma Son    Obesity Son    ADD / ADHD Son    Colon cancer Cousin    Asthma Sister    COPD Sister    Miscarriages / [place] Sister    Social History   Socioeconomic History   Marital status: Legally Separated    Spouse name: Not on file   Number of children: 2   Years of education: Not on file   Highest education level: Some college, no degree  Occupational History   Not on file  Tobacco Use   Smoking status: Never    Passive exposure: Past   Smokeless tobacco: Never  Vaping Use   Vaping status: Never Used  Substance and Sexual Activity   Alcohol use: Not Currently    Comment: Occasionally   Drug use: Never   Sexual activity: Not Currently    Birth control/protection: Post-menopausal, Surgical    Comment:  tubal  Other Topics Concern   Not on file  Social History Narrative   Separated for 12 years,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   Caffiene coffee 2 cups.  Cokes 2 daily.   Social Determinants of Health   Financial Resource Strain: High Risk (03/22/2023)   Overall Financial Resource Strain (CARDIA)    Difficulty of Paying Living Expenses: Hard  Food Insecurity: No Food Insecurity (04/29/2023)   Hunger Vital Sign    Worried About Running Out of Food in the Last Year: Never true    Ran Out of Food in the Last Year: Never true  Recent Concern: Food Insecurity - Food Insecurity Present (03/22/2023)   Hunger Vital Sign    Worried About Running Out of Food in the Last Year: Never true    Ran Out of Food in the Last Year: Sometimes true  Transportation Needs: No Transportation Needs (04/29/2023)   PRAPARE - Administrator, Civil Service (Medical): No    Lack of Transportation (Non-Medical): No  Physical Activity: Unknown (03/22/2023)   Exercise Vital Sign    Days of Exercise per Week: 0 days    Minutes of Exercise per Session: Not on file  Stress: Stress  Concern Present (03/22/2023)   [employer] of Occupational Health - Occupational Stress Questionnaire    Feeling of Stress : Rather much  Social Connections: Unknown (03/22/2023)   Social Connection and Isolation Panel [NHANES]    Frequency of Communication with Friends and Family: More than three times a week    Frequency of Social Gatherings with Friends and Family: Once a week    Attends Religious Services: Patient declined    Database administrator or Organizations: No    Attends Engineer, structural: Not on file    Marital Status: Separated   Past Surgical History:  Procedure Laterality Date   CESAREAN SECTION  06/02/94   GASTRIC BYPASS  2010   285lbs at highest weight   HYSTEROSCOPY WITH D & C  08/22/2019   JOINT REPLACEMENT  05/2019   Revision Right knee   OTHER  SURGICAL HISTORY  08/2019   Bilateral fallopian tube removal   PACEMAKER PLACEMENT  08/06/2020   REVISION TOTAL KNEE ARTHROPLASTY Right 06/01/2019   SHOULDER ARTHROSCOPY Left 2018   TUBAL LIGATION Bilateral 08/07/2019   Past Surgical History:  Procedure Laterality Date   CESAREAN SECTION  06/02/94   GASTRIC BYPASS  2010   285lbs at highest weight   HYSTEROSCOPY WITH D & C  08/22/2019   JOINT REPLACEMENT  05/2019   Revision Right knee   OTHER SURGICAL HISTORY  08/2019   Bilateral fallopian tube removal   PACEMAKER PLACEMENT  08/06/2020   REVISION TOTAL KNEE ARTHROPLASTY Right 06/01/2019   SHOULDER ARTHROSCOPY Left 2018   TUBAL LIGATION Bilateral 08/07/2019   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  Opioid Risk Score:   Fall Risk Score:  `1  Depression screen [facility] 2/9     04/29/2023    2:07 PM 04/15/2023    3:47 PM 03/25/2023    3:49 PM 12/31/2022    2:22 PM 11/26/2022    2:46 PM 09/15/2022    1:19 PM 08/05/2022    4:33 PM  Depression screen PHQ 2/9  Decreased Interest 1 0 0 0 0 2 3  Down, Depressed, Hopeless 1 0 0 0 0 1 0  PHQ - 2 Score 2 0 0 0 0 3 3  Altered sleeping     0 3 1  Tired, decreased energy     0 3 0  Change in appetite     0 3 0  Feeling bad or failure about yourself      0 0 0  Trouble concentrating     0 1 0  Moving slowly or fidgety/restless     0 1 0  Suicidal thoughts     0 0 0  PHQ-9 Score     0 14 4  Difficult doing work/chores     Not difficult at  all Very  difficult Somewhat difficult    Review of Systems     Objective:   Physical Exam        Assessment & Plan:

## 2023-05-25 ENCOUNTER — Encounter: Payer: BLUE CROSS/BLUE SHIELD | Admitting: Physical Medicine and Rehabilitation

## 2023-05-26 ENCOUNTER — Encounter: Payer: Self-pay | Admitting: Nurse Practitioner

## 2023-05-27 ENCOUNTER — Ambulatory Visit: Payer: No Typology Code available for payment source | Admitting: Nurse Practitioner

## 2023-05-27 ENCOUNTER — Ambulatory Visit: Payer: PRIVATE HEALTH INSURANCE

## 2023-05-30 ENCOUNTER — Other Ambulatory Visit: Payer: Self-pay | Admitting: Nurse Practitioner

## 2023-05-30 DIAGNOSIS — F419 Anxiety disorder, unspecified: Secondary | ICD-10-CM

## 2023-05-31 NOTE — Telephone Encounter (Signed)
Maralyn Sago is out of the office today. Forwarding to DOD pls advise on refill.Marland KitchenRaechel Chute

## 2023-06-02 ENCOUNTER — Other Ambulatory Visit: Payer: Self-pay | Admitting: Nurse Practitioner

## 2023-06-02 DIAGNOSIS — F419 Anxiety disorder, unspecified: Secondary | ICD-10-CM

## 2023-06-02 NOTE — Telephone Encounter (Signed)
Maralyn Sago is out of the office until 06/10/23. Forwarding to DOD.Marland KitchenRaechel Chute

## 2023-06-03 ENCOUNTER — Encounter: Payer: Self-pay | Admitting: Internal Medicine

## 2023-06-03 ENCOUNTER — Ambulatory Visit: Payer: PRIVATE HEALTH INSURANCE

## 2023-06-03 ENCOUNTER — Ambulatory Visit (AMBULATORY_SURGERY_CENTER): Payer: BLUE CROSS/BLUE SHIELD | Admitting: Internal Medicine

## 2023-06-03 VITALS — BP 112/62 | HR 53 | Temp 97.1°F | Resp 12 | Ht 60.0 in | Wt 199.0 lb

## 2023-06-03 DIAGNOSIS — Z9884 Bariatric surgery status: Secondary | ICD-10-CM

## 2023-06-03 DIAGNOSIS — D509 Iron deficiency anemia, unspecified: Secondary | ICD-10-CM | POA: Diagnosis present

## 2023-06-03 DIAGNOSIS — R194 Change in bowel habit: Secondary | ICD-10-CM

## 2023-06-03 MED ORDER — SODIUM CHLORIDE 0.9 % IV SOLN: 500.0000 mL | INTRAVENOUS | Status: DC

## 2023-06-03 NOTE — Op Note (Signed)
[facility] Patient Name: Diana Giles Procedure Date: 06/03/2023 2:23 PM MRN: [id number] Endoscopist: Wilhemina Bonito. Marina Goodell , MD, [id number] Age: 52 Referring MD:  Date of Birth: 01/31/1971 Gender: Female Account #: [id number] Procedure:                Colonoscopy Indications:              Iron deficiency anemia Medicines:                Monitored Anesthesia Care Procedure:                Pre-Anesthesia Assessment:                           - Prior to the procedure, a History and Physical                            was performed, and patient medications and                            allergies were reviewed. The patient's tolerance of                            previous anesthesia was also reviewed. The risks                            and benefits of the procedure and the sedation                            options and risks were discussed with the patient.                            All questions were answered, and informed consent                            was obtained. Prior Anticoagulants: The patient has                            taken no anticoagulant or antiplatelet agents. ASA                            Grade Assessment: II - A patient with mild systemic                            disease. After reviewing the risks and benefits,                            the patient was deemed in satisfactory condition to                            undergo the procedure.                           After obtaining informed consent, the colonoscope  was passed under direct vision. Throughout the                            procedure, the patient's blood pressure, pulse, and                            oxygen saturations were monitored continuously. The                            Olympus CF-HQ190L [id number]) Colonoscope was                            introduced through the anus and advanced to the the                            cecum, identified by appendiceal  orifice and                            ileocecal valve. The ileocecal valve, appendiceal                            orifice, and rectum were photographed. The quality                            of the bowel preparation was excellent. The                            colonoscopy was performed without difficulty. The                            patient tolerated the procedure well. The bowel                            preparation used was SUPREP via split dose                            instruction. Scope In: 2:34:11 PM Scope Out: 2:49:08 PM Scope Withdrawal Time: 0 hours 9 minutes 1 second  Total Procedure Duration: 0 hours 14 minutes 57 seconds  Findings:                 The entire examined colon appeared normal on direct                            and retroflexion views. Complications:            No immediate complications. Estimated blood loss:                            None. Estimated Blood Loss:     Estimated blood loss: none. Impression:               - The entire examined colon is normal on direct and                            retroflexion views.                           -  No specimens collected. Recommendation:           - Repeat colonoscopy in 10 years for screening                            purposes.                           - Patient has a contact number available for                            emergencies. The signs and symptoms of potential                            delayed complications were discussed with the                            patient. Return to normal activities tomorrow.                            Written discharge instructions were provided to the                            patient.                           - Resume previous diet.                           - Continue present medications.                           - Await pathology results. Wilhemina Bonito. Marina Goodell, MD 06/03/2023 2:54:02 PM This report has been signed electronically.

## 2023-06-03 NOTE — Progress Notes (Signed)
HISTORY OF PRESENT ILLNESS:   Diana Giles is a 52 y.o. 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  The patient is status post gastric bypass surgery remotely.  Successful but not ideal weight reduction.  Recently found to have iron deficiency anemia.  Scheduled for IV iron replacement.  Describes alternating bowel habits.  Also describes abdominal pain and foul-smelling gas.  Last colonoscopy performed in [place] September 01, 2015.  The terminal ileum was normal.  She was noted to have left-sided diverticulosis.  As well, hemorrhoids.  Follow-up in 10 years recommended.  Patient also reports some nausea for which she was given Zofran.  She describes a sensation that after eating things "just do not sit right".  She has had some weight gain.  She had been on meloxicam, but no longer.   Review of blood work from April 15, 2023 shows unremarkable comprehensive metabolic panel.  Iron studies confirm iron deficiency with ferritin of 4.4 and iron saturation of 7%.  Hemoglobin from Apr 05, 2023 was 10.9.  She was seen in the emergency room for flank pain.  Negative renal stone study   REVIEW OF SYSTEMS:   All non-GI ROS negative as otherwise stated in the HPI except for anxiety, fatigue, arthritis, back pain, headaches, muscle cramps       Past Medical History:  Diagnosis Date   Allergy     Anemia     Anxiety     Avascular necrosis of hip (HCC)     Blood transfusion without reported diagnosis     Bunion of left foot     COVID-19     DDD (degenerative disc disease), lumbar     Fibromyalgia     GERD (gastroesophageal reflux disease)     Herpes simplex virus (HSV) infection     HPV in female     Mental disorder     Rheumatoid arthritis (HCC)     Sinus node dysfunction (HCC)      s/p pacemaker    Thrombocytosis     Vaginal Pap smear, abnormal                 Past Surgical History:  Procedure Laterality Date   CESAREAN SECTION   06/02/94   GASTRIC BYPASS   2010    285lbs at highest weight   HYSTEROSCOPY WITH D & C   08/22/2019   JOINT REPLACEMENT   05/2019    Revision Right knee   OTHER SURGICAL HISTORY   08/2019    Bilateral fallopian tube removal   PACEMAKER PLACEMENT   08/06/2020   REVISION TOTAL KNEE ARTHROPLASTY Right 06/01/2019   SHOULDER ARTHROSCOPY Left 2018   TUBAL LIGATION Bilateral 08/07/2019          Social History Juniper Jipson  reports that she has never smoked. She has been exposed to tobacco smoke. She has never used smokeless tobacco. She reports that she does not currently use alcohol. She reports that she does not use drugs.   family history includes ADD / ADHD in her son; Anxiety disorder in her brother; Asthma in her brother, brother, brother, brother, mother, sister, and son; COPD in her mother, sister, and sister; Colon cancer in her cousin; Diabetes in her brother and father; Early death in her father and mother; Heart disease in her father; Hypertension in  her brother and mother; Hyperthyroidism in her sister; Kidney disease in her father; Miscarriages / [place] in her sister; Obesity in her brother, brother, and son; Polycystic ovary syndrome in her daughter; Stroke in her mother.   Allergies       Allergies  Allergen Reactions   Nickel Other (See Comments)   Penicillins        Other Reaction(s): Not available   Shellfish Allergy     Sulfa Antibiotics Rash            PHYSICAL EXAMINATION: Vital signs: BP 126/70   Pulse 78   Ht 5' (1.524 m)   Wt 199 lb 9.6 oz (90.5 kg)   SpO2 96%   BMI 38.98 kg/m   Constitutional: generally well-appearing, no acute distress Psychiatric: alert and oriented x3, cooperative Eyes: extraocular movements intact, anicteric, conjunctiva pink Mouth: oral pharynx moist, no lesions Neck: supple no  lymphadenopathy Cardiovascular: heart regular rate and rhythm, no murmur Lungs: clear to auscultation bilaterally Abdomen: soft, nontender, nondistended, no obvious ascites, no peritoneal signs, normal bowel sounds, no organomegaly Rectal: Deferred until colonoscopy Extremities: no clubbing, cyanosis, or lower extremity edema bilaterally Skin: no lesions on visible extremities Neuro: No focal deficits.  Cranial nerves intact   ASSESSMENT:   1.  Postprandial abdominal discomfort, bloating, increased intestinal gas.  On omeprazole 20 mg daily. 2.  History of gastric bypass.  Rule out bacterial overgrowth 3.  Change in bowel habits with alternating pattern 4.  Iron deficiency anemia likely due to poor iron absorption post gastric bypass.  Rule out GI mucosal lesion 5.  Colonoscopy in [place] with left-sided diverticulosis     PLAN:   1.  Schedule upper endoscopy to evaluate upper GI complaints and iron deficiency anemia.The nature of the procedure, as well as the risks, benefits, and alternatives were carefully and thoroughly reviewed with the patient. Ample time for discussion and questions allowed. The patient understood, was satisfied, and agreed to proceed. 2.  Schedule colonoscopy to evaluate lower GI complaints and iron deficiency anemia.The nature of the procedure, as well as the risks, benefits, and alternatives were carefully and thoroughly reviewed with the patient. Ample time for discussion and questions allowed. The patient understood, was satisfied, and agreed to proceed. 3.  Continue iron replacement therapy with supervising physician 4.  Consider empiric trial of metronidazole for possible bacterial overgrowth in this postoperative patient

## 2023-06-03 NOTE — Patient Instructions (Signed)
Discharge instructions given. Normal exams. Resume previous medications. YOU HAD AN ENDOSCOPIC PROCEDURE TODAY AT THE [facility]:   Refer to the procedure report that was given to you for any specific questions about what was found during the examination.  If the procedure report does not answer your questions, please call your gastroenterologist to clarify.  If you requested that your care partner not be given the details of your procedure findings, then the procedure report has been included in a sealed envelope for you to review at your convenience later.  YOU SHOULD EXPECT: Some feelings of bloating in the abdomen. Passage of more gas than usual.  Walking can help get rid of the air that was put into your GI tract during the procedure and reduce the bloating. If you had a lower endoscopy (such as a colonoscopy or flexible sigmoidoscopy) you may notice spotting of blood in your stool or on the toilet paper. If you underwent a bowel prep for your procedure, you may not have a normal bowel movement for a few days.  Please Note:  You might notice some irritation and congestion in your nose or some drainage.  This is from the oxygen used during your procedure.  There is no need for concern and it should clear up in a day or so.  SYMPTOMS TO REPORT IMMEDIATELY:  Following lower endoscopy (colonoscopy or flexible sigmoidoscopy):  Excessive amounts of blood in the stool  Significant tenderness or worsening of abdominal pains  Swelling of the abdomen that is new, acute  Fever of 100F or higher  Following upper endoscopy (EGD)  Vomiting of blood or coffee ground material  New chest pain or pain under the shoulder blades  Painful or persistently difficult swallowing  New shortness of breath  Fever of 100F or higher  Black, tarry-looking stools  For urgent or emergent issues, a gastroenterologist can be reached at any hour by calling [phone]. Do not use MyChart messaging for  urgent concerns.    DIET:  We do recommend a small meal at first, but then you may proceed to your regular diet.  Drink plenty of fluids but you should avoid alcoholic beverages for 24 hours.  ACTIVITY:  You should plan to take it easy for the rest of today and you should NOT DRIVE or use heavy machinery until tomorrow (because of the sedation medicines used during the test).    FOLLOW UP: Our staff will call the number listed on your records the next business day following your procedure.  We will call around 7:15- 8:00 am to check on you and address any questions or concerns that you may have regarding the information given to you following your procedure. If we do not reach you, we will leave a message.     If any biopsies were taken you will be contacted by phone or by letter within the next 1-3 weeks.  Please call us at [phone] if you have not heard about the biopsies in 3 weeks.    SIGNATURES/CONFIDENTIALITY: You and/or your care partner have signed paperwork which will be entered into your electronic medical record.  These signatures attest to the fact that that the information above on your After Visit Summary has been reviewed and is understood.  Full responsibility of the confidentiality of this discharge information lies with you and/or your care-partner.

## 2023-06-03 NOTE — Progress Notes (Signed)
Pt's states no medical or surgical changes since previsit or office visit. 

## 2023-06-03 NOTE — Op Note (Signed)
[facility] Patient Name: Diana Giles Procedure Date: 06/03/2023 2:23 PM MRN: [id number] Endoscopist: Wilhemina Bonito. Marina Goodell , MD, [id number] Age: 52 Referring MD:  Date of Birth: 1971/01/22 Gender: Female Account #: [id number] Procedure:                Upper GI endoscopy Indications:              Iron deficiency anemia Medicines:                Monitored Anesthesia Care Procedure:                Pre-Anesthesia Assessment:                           - Prior to the procedure, a History and Physical                            was performed, and patient medications and                            allergies were reviewed. The patient's tolerance of                            previous anesthesia was also reviewed. The risks                            and benefits of the procedure and the sedation                            options and risks were discussed with the patient.                            All questions were answered, and informed consent                            was obtained. Prior Anticoagulants: The patient has                            taken no anticoagulant or antiplatelet agents. ASA                            Grade Assessment: II - A patient with mild systemic                            disease. After reviewing the risks and benefits,                            the patient was deemed in satisfactory condition to                            undergo the procedure.                           After obtaining informed consent, the endoscope was  passed under direct vision. Throughout the                            procedure, the patient's blood pressure, pulse, and                            oxygen saturations were monitored continuously. The                            Olympus Scope [id number] was introduced through the                            mouth, and advanced to the second part of duodenum.                            The upper GI endoscopy  was accomplished without                            difficulty. The patient tolerated the procedure                            well. Scope In: Scope Out: Findings:                 The esophagus was normal.                           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                           The gastric and jejunal mucosa was normal                            throughout.                           The anastomosis was widely patent. Complications:            No immediate complications. Estimated Blood Loss:     Estimated blood loss: none. Impression:               1. Status post Roux-en-Y gastric bypass anatomy.                           2. No mucosal abnormalities                           3. Iron deficiency anemia secondary to poor iron                            absorption from altered gastric anatomy. Recommendation:           - Patient has a contact number available for  emergencies. The signs and symptoms of potential                            delayed complications were discussed with the                            patient. Return to normal activities tomorrow.                            Written discharge instructions were provided to the                            patient.                           - Resume previous diet.                           - Continue present medications.                           - Iron replacement and monitoring of blood counts                            per hematology                           - Return to the care of your primary providers Wilhemina Bonito. Marina Goodell, MD 06/03/2023 3:03:09 PM This report has been signed electronically.

## 2023-06-03 NOTE — Progress Notes (Signed)
Vss nad trans to pacu 

## 2023-06-06 ENCOUNTER — Telehealth: Payer: Self-pay | Admitting: *Deleted

## 2023-06-06 ENCOUNTER — Other Ambulatory Visit: Payer: Self-pay | Admitting: Obstetrics & Gynecology

## 2023-06-06 NOTE — Telephone Encounter (Signed)
  Follow up Call-     06/03/2023    2:07 PM  Call back number  Post procedure Call Back phone  # [phone]  Permission to leave phone message Yes     Patient questions:  Do you have a fever, pain , or abdominal swelling? No. Pain Score  0 *  Have you tolerated food without any problems? Yes.    Have you been able to return to your normal activities? Yes.    Do you have any questions about your discharge instructions: Diet   No. Medications  No. Follow up visit  No.  Do you have questions or concerns about your Care? No.  Actions: * If pain score is 4 or above: No action needed, pain <4.

## 2023-06-10 ENCOUNTER — Ambulatory Visit: Payer: BLUE CROSS/BLUE SHIELD | Attending: Physical Medicine and Rehabilitation

## 2023-06-10 DIAGNOSIS — G8929 Other chronic pain: Secondary | ICD-10-CM | POA: Diagnosis present

## 2023-06-10 DIAGNOSIS — M25561 Pain in right knee: Secondary | ICD-10-CM | POA: Insufficient documentation

## 2023-06-10 DIAGNOSIS — M25551 Pain in right hip: Secondary | ICD-10-CM

## 2023-06-10 DIAGNOSIS — M5459 Other low back pain: Secondary | ICD-10-CM

## 2023-06-10 DIAGNOSIS — M5431 Sciatica, right side: Secondary | ICD-10-CM | POA: Diagnosis present

## 2023-06-10 NOTE — Therapy (Signed)
OUTPATIENT PHYSICAL THERAPY THORACOLUMBAR TREATMENT/RECERT    Patient Name: Kerrington Sova MRN: [id number] DOB:1971-05-14, 52 y.o., female Today's Date: 06/10/2023   END OF SESSION:  PT End of Session - 06/10/23 0906     Visit Number 14    Number of Visits 26    Date for PT Re-Evaluation 07/22/23    Authorization Type [employer]    PT Start Time 0904    PT Stop Time 0945    PT Time Calculation (min) 41 min    Activity Tolerance Patient tolerated treatment well    Behavior During Therapy WFL for tasks assessed/performed                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    Mental disorder    Rheumatoid arthritis (HCC)    Sinus node dysfunction (HCC)    s/p pacemaker   Thrombocytosis    Vaginal Pap smear, abnormal    Past Surgical History:  Procedure Laterality Date   CESAREAN SECTION  06/02/94   GASTRIC BYPASS  2010   285lbs at highest weight   HYSTEROSCOPY WITH D & C  08/22/2019   JOINT REPLACEMENT  05/2019   Revision Right knee   OTHER SURGICAL HISTORY  08/2019   Bilateral fallopian tube removal   PACEMAKER PLACEMENT  08/06/2020   REVISION TOTAL KNEE ARTHROPLASTY Right 06/01/2019   SHOULDER ARTHROSCOPY Left 2018   TUBAL LIGATION Bilateral 08/07/2019   Patient Active Problem List   Diagnosis Date Noted   Iron deficiency anemia, unspecified 04/29/2023   Anemia 04/15/2023   Abnormal TSH 04/15/2023   Generalized abdominal pain 03/25/2023   Change in nail appearance 03/25/2023   Diabetes mellitus screening 03/25/2023   Encounter for lipid screening for cardiovascular disease 03/25/2023   Encounter for hepatitis C screening test for low risk  patient 03/25/2023   Encounter for screening mammogram for malignant neoplasm of breast 03/25/2023   COVID-19 11/26/2022   Chronic bilateral low back pain with right-sided sciatica 09/15/2022   Arthritis pain 09/15/2022   Fibromyalgia 09/15/2022   Chronic pain syndrome 09/15/2022   Bilateral sacroiliitis (HCC) 09/15/2022   Postmenopause 08/09/2022   Pacemaker 01/07/2021   Fatigue 11/13/2020   DDD (degenerative disc disease), lumbar 11/13/2020   Avascular necrosis of bone of hip, right (HCC) 11/13/2020   Sinus node dysfunction (HCC) 11/13/2020   Thrombocytosis 11/13/2020   Anxiety 11/13/2020   Attention deficit hyperactivity disorder (ADHD) 11/13/2020    PCP: [name], NP    REFERRING PROVIDER: Elijah Birk, DO  REFERRING DIAG: Chronic bilateral LBP with Rt sided sciatica; fibromyalgia; chronic pain syndrome; bilateral sacroiliitis  Rationale for Evaluation and Treatment: Rehabilitation  THERAPY DIAG:  Other low back pain  Pain in right hip  Sciatica, right side  Chronic pain of right knee  ONSET DATE:  SUBJECTIVE:  SUBJECTIVE STATEMENT: Pt reports pain in R lower back and leg is 7/10 NPS. Has had difficulty getting to PT due to other MD appointments and FMLA issues with work.   EVAL: Pt states that she has been dealing with pain for atleast 20 years, but in the past 5 years its gotten worse with falls, injuries, etc. She was started on Gabapentin and feels this has helped her better pinpoint her pain. Pain is sacrum into the Rt buttock and lateral thigh down around the knee. She has numbness in the lateral foot. She has had PT several times in the past. She is interested in aquatic PT because it's the only thing she hasn't done to help the pain.  Pain is often so bad that she can't get out  of bed.   PERTINENT HISTORY:  Rt hip avascular necrosis of hip Anxiety  DDD Fibromyalgia  Rheumatoid Arthritis  Lt shoulder arthroplasty Rt total knee replacement and revision 2020 Pace maker   PAIN:  Are you having pain? Yes: NPRS scale: 7/10 Pain location: sacrum and Rt buttock, Rt LE/lateral knee Pain description: constant  Aggravating factors: sitting, supine, prolonged activity/walking  Relieving factors: avoiding prolonged activity   PRECAUTIONS: None  WEIGHT BEARING RESTRICTIONS: No  FALLS:  Has patient fallen in last 6 months? No  LIVING ENVIRONMENT: Lives with: lives with their family Lives in: House/apartment Stairs: No Has following equipment at home: None  OCCUPATION: Phlebotomist   PLOF: Independent  PATIENT GOALS: decrease pain  NEXT MD VISIT: f/u on MRI soon  OBJECTIVE:   DIAGNOSTIC FINDINGS:  MRI ordered   PATIENT SURVEYS:  Need next visit  SCREENING FOR RED FLAGS: Bowel or bladder incontinence: No Spinal tumors: No Cauda equina syndrome: No Compression fracture: No Abdominal aneurysm: No  COGNITION: Overall cognitive status: Within functional limits for tasks assessed     SENSATION: Pt notes numbness   MUSCLE LENGTH: Hamstrings: Right WNL deg; Left WNL deg Maisie Fus test: Right (+) Rt for quad and hip flexor  POSTURE: No Significant postural limitations  PALPATION: Tenderness Rt lateral gastroc/peroneals, Rt lateral quad/ITB, Rt glutes  LUMBAR ROM:   AROM eval  Flexion   Extension   Right lateral flexion   Left lateral flexion   Right rotation   Left rotation    (Blank rows = not tested)  LOWER EXTREMITY ROM:     Active  Right 04/29/23 Left 04/29/23  Hip flexion 110* 110  Hip extension    Hip abduction    Hip adduction    Hip internal rotation 41* 52  Hip external rotation 25 45  Knee flexion    Knee extension    Ankle dorsiflexion    Ankle plantarflexion    Ankle inversion    Ankle eversion     (Blank rows  = not tested)  LOWER EXTREMITY MMT:    MMT Right eval Left eval Right  04/22/23 Left  04/22/23 Right  06/10/23 Left 06/10/23  Hip flexion 4 4      Hip extension 3 4 3+  4 4  Hip abduction 3 4 4      Hip adduction        Hip internal rotation        Hip external rotation        Knee flexion 5 5      Knee extension 4 5      Ankle dorsiflexion        Ankle plantarflexion Heel raise unable Heel raise x6 Heel raise  x6 Heel raise x7 x7 x9  Ankle inversion        Ankle eversion         (Blank rows = not tested)  LUMBAR SPECIAL TESTS:  Next visit  FUNCTIONAL TESTS:  5 times sit to stand: 18 sec, weight shifted Lt  Timed up and go (TUG): 11 sec 6 MWT: 1,046'  GAIT: Distance walked: 57ft  Assistive device utilized: None Level of assistance: Complete Independence Comments: decreased stance on Rt, decreased step length on Lt, decreased push off on Rt    TODAY'S TREATMENT:   Date: 06/10/23  There.Ex:  Reviewing goals to assess progress towards POC.  3x10, 6# Med ball bridges  3x8, GTB hip ER isometric with glut bridge  Standing bird dog with red physioball on abdomen: 2x8/side  PATIENT EDUCATION:  Education details: eval findings/POC; implemented HEP; benefits of aquatic PT; chronic pain response Person educated: Patient Education method: Explanation and Handouts Education comprehension: verbalized understanding and returned demonstration  HOME EXERCISE PROGRAM: Access Code: [email or web address] URL: [email or web address] Date: 04/22/2023 Prepared by: Ronnie Derby  Exercises - Modified Erby Pian  - 2 x daily - 7 x weekly - 2 sets - 20 seconds hold - Heel Raises with Counter Support with Brace On  - 3 x daily - 7 x weekly - 10 reps - Supine Bridge  - 1 x daily - 7 x weekly - 3 sets - 8 reps - Supine Posterior Pelvic Tilt  - 1 x daily - 7 x weekly - 2 sets - 20 reps - Supine March with Posterior Pelvic Tilt  - 1 x daily - 7 x weekly - 2 sets - 8  reps   Access Code: [email or web address] URL: [email or web address] Date: 03/04/2023 Prepared by: [facility] - [facility]patient Rehab - [facility]  Exercises - Modified Maisie Fus Stretch  - 2 x daily - 7 x weekly - 2 sets - 20 seconds hold - Heel Raises with Counter Support with Brace On  - 3 x daily - 7 x weekly - 10 reps  ASSESSMENT:  CLINICAL IMPRESSION: Pt arriving to PT with limited compliance in last POC due to other medical appointments and work related issues. Pt has made progress in her glut strength and 6 MWT but remains with RLE functional weakness due to severe R sided hip and lower back pain with limitations in R heel raises and remains only 20% improved with activity participation. PT educating pt on importance of PT participation to optimize functional prognosis. Pt understanding. Pt will continue to benefit from skilled therapy to address remaining deficits in order to improve overall QoL and return to PLOF.   OBJECTIVE IMPAIRMENTS: Abnormal gait, decreased activity tolerance, decreased balance, decreased endurance, decreased strength, impaired flexibility, and pain.   ACTIVITY LIMITATIONS: lifting, bending, sitting, standing, squatting, stairs, locomotion level, and caring for others  PARTICIPATION LIMITATIONS: cleaning, laundry, interpersonal relationship, shopping, community activity, occupation, and yard work  PERSONAL FACTORS: Age, Fitness, Past/current experiences, Time since onset of injury/illness/exacerbation, and 3+ comorbidities: Rt hip avascular necrosis, fibromyalgia, Rt TKA with revision  are also affecting patient's functional outcome.   REHAB POTENTIAL: Good  CLINICAL DECISION MAKING: Evolving/moderate complexity  EVALUATION COMPLEXITY: Moderate   GOALS: Goals reviewed with patient? Yes  SHORT TERM GOALS: Target date: 03/11/23  Pt will be independent with her initial HEP to improve LE strength and flexibility. Baseline: ; 04/22/23: 75-85%  compliance Goal status: In progress    LONG TERM GOALS: Target date: 06/10/23  Pt will report  atleast 40% improvement in her activity participation from the start of PT. Baseline:  ; 04/22/23: 20% improvement in activity for exercise (walking, playing with niece's kids); 06/10/23: 20% Goal status: In progress  2.  Pt will have improved hip strength to atleast 4/5 MMT which will increase efficiency with daily activity. Baseline: ; 04/22/23: hip extension 3+; 06/10/23: 4/5 MMT Goal status: MET  3.  Pt will have improved Rt hip flexibility evident by negative thomas test.  Baseline: ; 04/22/23: negative Goal status: MET  4.  Pt will have improved gastroc strength bilaterally, able to complete atleast 15 consecutive single leg heel raises. Baseline: 04/22/23: RLE: 6, LLE: 7; 06/10/23: LLE: 9, RLE: 7  Goal status: In progress  5.  Pt will be independent with an advanced HEP to increase  Baseline: ;Deferred; incomplete written goal from eval paperwork Goal status: Deferred  6. Pt will improve 6 MWT by at least 164' indicating clinically significant improvement in community ambulation and work distances.  Baseline: 04/29/23: 1,046'; 06/10/23: 1,200' Goal Status: In progress  PLAN:  PT FREQUENCY: 2x/week  PT DURATION: 6 weeks  PLANNED INTERVENTIONS: Therapeutic exercises, Therapeutic activity, Neuromuscular re-education, Balance training, Gait training, Patient/Family education, Self Care, Joint mobilization, Aquatic Therapy, Dry Needling, Moist heat, Taping, Manual therapy, and Re-evaluation.  PLAN FOR NEXT SESSION: introduce more core stability exercises and add to HEP.   Delphia Grates. Fairly IV, PT, DPT Physical Therapist- [place]  [facility]  06/10/23, 11:30 AM

## 2023-06-17 ENCOUNTER — Ambulatory Visit: Payer: BLUE CROSS/BLUE SHIELD

## 2023-06-17 DIAGNOSIS — M5459 Other low back pain: Secondary | ICD-10-CM

## 2023-06-17 DIAGNOSIS — M25551 Pain in right hip: Secondary | ICD-10-CM

## 2023-06-17 DIAGNOSIS — M5431 Sciatica, right side: Secondary | ICD-10-CM

## 2023-06-17 NOTE — Therapy (Addendum)
OUTPATIENT PHYSICAL THERAPY THORACOLUMBAR TREATMENT    Patient Name: Diana Giles MRN: [id number] DOB:1971-02-23, 52 y.o., female Today's Date: 06/17/2023   END OF SESSION:  PT End of Session - 06/17/23 0858     Visit Number 15    Number of Visits 26    Date for PT Re-Evaluation 07/22/23    Authorization Type [employer]    PT Start Time 0900    PT Stop Time 0944    PT Time Calculation (min) 44 min    Activity Tolerance Patient tolerated treatment well    Behavior During Therapy WFL for tasks assessed/performed                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    Mental disorder    Rheumatoid arthritis (HCC)    Sinus node dysfunction (HCC)    s/p pacemaker   Thrombocytosis    Vaginal Pap smear, abnormal    Past Surgical History:  Procedure Laterality Date   CESAREAN SECTION  06/02/94   GASTRIC BYPASS  2010   285lbs at highest weight   HYSTEROSCOPY WITH D & C  08/22/2019   JOINT REPLACEMENT  05/2019   Revision Right knee   OTHER SURGICAL HISTORY  08/2019   Bilateral fallopian tube removal   PACEMAKER PLACEMENT  08/06/2020   REVISION TOTAL KNEE ARTHROPLASTY Right 06/01/2019   SHOULDER ARTHROSCOPY Left 2018   TUBAL LIGATION Bilateral 08/07/2019   Patient Active Problem List   Diagnosis Date Noted   Iron deficiency anemia, unspecified 04/29/2023   Anemia 04/15/2023   Abnormal TSH 04/15/2023   Generalized abdominal pain 03/25/2023   Change in nail appearance 03/25/2023   Diabetes mellitus screening 03/25/2023   Encounter for lipid screening for cardiovascular disease 03/25/2023   Encounter for hepatitis C screening test for low risk patient  03/25/2023   Encounter for screening mammogram for malignant neoplasm of breast 03/25/2023   COVID-19 11/26/2022   Chronic bilateral low back pain with right-sided sciatica 09/15/2022   Arthritis pain 09/15/2022   Fibromyalgia 09/15/2022   Chronic pain syndrome 09/15/2022   Bilateral sacroiliitis (HCC) 09/15/2022   Postmenopause 08/09/2022   Pacemaker 01/07/2021   Fatigue 11/13/2020   DDD (degenerative disc disease), lumbar 11/13/2020   Avascular necrosis of bone of hip, right (HCC) 11/13/2020   Sinus node dysfunction (HCC) 11/13/2020   Thrombocytosis 11/13/2020   Anxiety 11/13/2020   Attention deficit hyperactivity disorder (ADHD) 11/13/2020    PCP: [name], NP    REFERRING PROVIDER: Elijah Birk, DO  REFERRING DIAG: Chronic bilateral LBP with Rt sided sciatica; fibromyalgia; chronic pain syndrome; bilateral sacroiliitis  Rationale for Evaluation and Treatment: Rehabilitation  THERAPY DIAG:  Other low back pain  Pain in right hip  Sciatica, right side  ONSET DATE:  SUBJECTIVE:  SUBJECTIVE STATEMENT: Pt reports pain in R lower back and leg is 8/10 NPS. Pt states that [place], w/o a significant increase in pain. Pt also reports stiffness in the back and leg that she attributes to the rainy weather this week.  EVAL: Pt states that she has been dealing with pain for atleast 20 years, but in the past 5 years its gotten worse with falls, injuries, etc. She was started on Gabapentin and feels this has helped her better pinpoint her pain. Pain is sacrum into the Rt buttock and lateral thigh down around the knee. She has numbness in the lateral foot. She has had PT several times in the past. She is interested in aquatic PT because it's the only thing she  hasn't done to help the pain.  Pain is often so bad that she can't get out of bed.   PERTINENT HISTORY:  Rt hip avascular necrosis of hip Anxiety  DDD Fibromyalgia  Rheumatoid Arthritis  Lt shoulder arthroplasty Rt total knee replacement and revision 2020 Pace maker   PAIN:  Are you having pain? Yes: NPRS scale: 7/10 Pain location: sacrum and Rt buttock, Rt LE/lateral knee Pain description: constant  Aggravating factors: sitting, supine, prolonged activity/walking  Relieving factors: avoiding prolonged activity   PRECAUTIONS: None  WEIGHT BEARING RESTRICTIONS: No  FALLS:  Has patient fallen in last 6 months? No  LIVING ENVIRONMENT: Lives with: lives with their family Lives in: House/apartment Stairs: No Has following equipment at home: None  OCCUPATION: Phlebotomist   PLOF: Independent  PATIENT GOALS: decrease pain  NEXT MD VISIT: f/u on MRI soon  OBJECTIVE:   DIAGNOSTIC FINDINGS:  MRI ordered   PATIENT SURVEYS:  Need next visit  SCREENING FOR RED FLAGS: Bowel or bladder incontinence: No Spinal tumors: No Cauda equina syndrome: No Compression fracture: No Abdominal aneurysm: No  COGNITION: Overall cognitive status: Within functional limits for tasks assessed     SENSATION: Pt notes numbness   MUSCLE LENGTH: Hamstrings: Right WNL deg; Left WNL deg Maisie Fus test: Right (+) Rt for quad and hip flexor  POSTURE: No Significant postural limitations  PALPATION: Tenderness Rt lateral gastroc/peroneals, Rt lateral quad/ITB, Rt glutes  LUMBAR ROM:   AROM eval  Flexion   Extension   Right lateral flexion   Left lateral flexion   Right rotation   Left rotation    (Blank rows = not tested)  LOWER EXTREMITY ROM:     Active  Right 04/29/23 Left 04/29/23  Hip flexion 110* 110  Hip extension    Hip abduction    Hip adduction    Hip internal rotation 41* 52  Hip external rotation 25 45  Knee flexion    Knee extension    Ankle dorsiflexion     Ankle plantarflexion    Ankle inversion    Ankle eversion     (Blank rows = not tested)  LOWER EXTREMITY MMT:    MMT Right eval Left eval Right  04/22/23 Left  04/22/23 Right  06/10/23 Left 06/10/23  Hip flexion 4 4      Hip extension 3 4 3+  4 4  Hip abduction 3 4 4      Hip adduction        Hip internal rotation        Hip external rotation        Knee flexion 5 5      Knee extension 4 5  Ankle dorsiflexion        Ankle plantarflexion Heel raise unable Heel raise x6 Heel raise x6 Heel raise x7 x7 x9  Ankle inversion        Ankle eversion         (Blank rows = not tested)  LUMBAR SPECIAL TESTS:  Next visit  FUNCTIONAL TESTS:  5 times sit to stand: 18 sec, weight shifted Lt  Timed up and go (TUG): 11 sec 6 MWT: 1,046'  GAIT: Distance walked: 53ft  Assistive device utilized: None Level of assistance: Complete Independence Comments: decreased stance on Rt, decreased step length on Lt, decreased push off on Rt    TODAY'S TREATMENT:   Date: 06/17/23  There.Ex:  Nustep L3 x to increase lumbar and LE mobility  TRX straps squats 2 x8, vc'ing needed to maintain proper form, mild L sided lean w/ squat. Corrects after VC's  Side stepping w/ 6# med ball 6x10'  Bird dogs on wall 2x10 w/ red theraball, v/c to maintain LE extension while completing activity.   Alternating lunges on 6" step w/ 4# weight in bilat UE 's 2 x10  Supine glute bridges w/ RTB 3 x10 w/ 3 sec hold.   Supine "windshield wipers" lumbar trunk rotation x 10/side to increase lumbar mobility.   Seated lumbar 3 direction ball rollout stretch x 8/ in each direction.   PATIENT EDUCATION:  Education details: eval findings/POC; implemented HEP; benefits of aquatic PT; chronic pain response Person educated: Patient Education method: Explanation and Handouts Education comprehension: verbalized understanding and returned demonstration  HOME EXERCISE PROGRAM: Access Code: [email or web address] URL:  [email or web address] Date: 04/22/2023 Prepared by: Ronnie Derby  Exercises - Modified Erby Pian  - 2 x daily - 7 x weekly - 2 sets - 20 seconds hold - Heel Raises with Counter Support with Brace On  - 3 x daily - 7 x weekly - 10 reps - Supine Bridge  - 1 x daily - 7 x weekly - 3 sets - 8 reps - Supine Posterior Pelvic Tilt  - 1 x daily - 7 x weekly - 2 sets - 20 reps - Supine March with Posterior Pelvic Tilt  - 1 x daily - 7 x weekly - 2 sets - 8 reps   Access Code: [email or web address] URL: [email or web address] Date: 03/04/2023 Prepared by: [facility] - [facility]patient Rehab - [facility]  Exercises - Modified Maisie Fus Stretch  - 2 x daily - 7 x weekly - 2 sets - 20 seconds hold - Heel Raises with Counter Support with Brace On  - 3 x daily - 7 x weekly - 10 reps  ASSESSMENT:  CLINICAL IMPRESSION: Session focused on LE strengthening and improving lumbar mobility. Pt displays progression in RLE strength displayed with increased activity tolerance with exercise at today's session progressing to standing BLE and SLE strength targeted exercises. Pt also showed improvement in lumbar pain as session progressed, and improved lumbar flexibility at the end of the session noted w/ there ex performed. Pt continues to have focal points of pain and tenderness at the R knee and R lumbar area, limiting functional mobility. Pt would continue to benefit from skilled PT interventions to address remaining deficits in strength, ROM, and pain to return to PLOF.  OBJECTIVE IMPAIRMENTS: Abnormal gait, decreased activity tolerance, decreased balance, decreased endurance, decreased strength, impaired flexibility, and pain.   ACTIVITY LIMITATIONS: lifting, bending, sitting, standing, squatting, stairs, locomotion level, and caring for others  PARTICIPATION LIMITATIONS: cleaning, laundry, interpersonal relationship, shopping,  community activity, occupation, and yard work  PERSONAL  FACTORS: Age, Fitness, Past/current experiences, Time since onset of injury/illness/exacerbation, and 3+ comorbidities: Rt hip avascular necrosis, fibromyalgia, Rt TKA with revision  are also affecting patient's functional outcome.   REHAB POTENTIAL: Good  CLINICAL DECISION MAKING: Evolving/moderate complexity  EVALUATION COMPLEXITY: Moderate   GOALS: Goals reviewed with patient? Yes  SHORT TERM GOALS: Target date: 03/11/23  Pt will be independent with her initial HEP to improve LE strength and flexibility. Baseline: ; 04/22/23: 75-85% compliance Goal status: In progress    LONG TERM GOALS: Target date: 06/10/23  Pt will report atleast 40% improvement in her activity participation from the start of PT. Baseline:  ; 04/22/23: 20% improvement in activity for exercise (walking, playing with niece's kids); 06/10/23: 20% Goal status: In progress  2.  Pt will have improved hip strength to atleast 4/5 MMT which will increase efficiency with daily activity. Baseline: ; 04/22/23: hip extension 3+; 06/10/23: 4/5 MMT Goal status: MET  3.  Pt will have improved Rt hip flexibility evident by negative thomas test.  Baseline: ; 04/22/23: negative Goal status: MET  4.  Pt will have improved gastroc strength bilaterally, able to complete atleast 15 consecutive single leg heel raises. Baseline: 04/22/23: RLE: 6, LLE: 7; 06/10/23: LLE: 9, RLE: 7  Goal status: In progress  5.  Pt will be independent with an advanced HEP to increase  Baseline: ;Deferred; incomplete written goal from eval paperwork Goal status: Deferred  6. Pt will improve 6 MWT by at least 164' indicating clinically significant improvement in community ambulation and work distances.  Baseline: 04/29/23: 1,046'; 06/10/23: 1,200' Goal Status: In progress  PLAN:  PT FREQUENCY: 2x/week  PT DURATION: 6 weeks  PLANNED INTERVENTIONS: Therapeutic exercises, Therapeutic activity, Neuromuscular re-education, Balance training, Gait training,  Patient/Family education, Self Care, Joint mobilization, Aquatic Therapy, Dry Needling, Moist heat, Taping, Manual therapy, and Re-evaluation.  PLAN FOR NEXT SESSION: Update HEP if new exercises at session tolerated well, progress core stability exercises   Lovie Macadamia, SPT Physical Therapist- [facility]  [facility]   [place]. Fairly IV, PT, DPT Physical Therapist- [place]  [facility]  06/17/23, 10:41 AM

## 2023-06-23 ENCOUNTER — Encounter (INDEPENDENT_AMBULATORY_CARE_PROVIDER_SITE_OTHER): Payer: Self-pay

## 2023-06-23 ENCOUNTER — Encounter: Payer: Self-pay | Admitting: Oncology

## 2023-06-24 ENCOUNTER — Encounter: Payer: Self-pay | Admitting: Oncology

## 2023-06-24 ENCOUNTER — Ambulatory Visit: Payer: BLUE CROSS/BLUE SHIELD

## 2023-06-24 DIAGNOSIS — M25551 Pain in right hip: Secondary | ICD-10-CM

## 2023-06-24 DIAGNOSIS — M5459 Other low back pain: Secondary | ICD-10-CM

## 2023-06-24 DIAGNOSIS — M5431 Sciatica, right side: Secondary | ICD-10-CM

## 2023-06-24 DIAGNOSIS — G8929 Other chronic pain: Secondary | ICD-10-CM

## 2023-06-24 NOTE — Therapy (Addendum)
OUTPATIENT PHYSICAL THERAPY THORACOLUMBAR TREATMENT    Patient Name: Diana Giles MRN: [id number] DOB:1971/03/16, 52 y.o., female Today's Date: 06/24/2023   END OF SESSION:  PT End of Session - 06/24/23 1112     Visit Number 16    Number of Visits 26    Date for PT Re-Evaluation 07/22/23    Authorization Type [employer]    PT Start Time 1115    PT Stop Time 1157    PT Time Calculation (min) 42 min    Activity Tolerance Patient tolerated treatment well    Behavior During Therapy WFL for tasks assessed/performed                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    Mental disorder    Rheumatoid arthritis (HCC)    Sinus node dysfunction (HCC)    s/p pacemaker   Thrombocytosis    Vaginal Pap smear, abnormal    Past Surgical History:  Procedure Laterality Date   CESAREAN SECTION  06/02/94   GASTRIC BYPASS  2010   285lbs at highest weight   HYSTEROSCOPY WITH D & C  08/22/2019   JOINT REPLACEMENT  05/2019   Revision Right knee   OTHER SURGICAL HISTORY  08/2019   Bilateral fallopian tube removal   PACEMAKER PLACEMENT  08/06/2020   REVISION TOTAL KNEE ARTHROPLASTY Right 06/01/2019   SHOULDER ARTHROSCOPY Left 2018   TUBAL LIGATION Bilateral 08/07/2019   Patient Active Problem List   Diagnosis Date Noted   Iron deficiency anemia, unspecified 04/29/2023   Anemia 04/15/2023   Abnormal TSH 04/15/2023   Generalized abdominal pain 03/25/2023   Change in nail appearance 03/25/2023   Diabetes mellitus screening 03/25/2023   Encounter for lipid screening for cardiovascular disease 03/25/2023   Encounter for hepatitis C screening test for low risk patient  03/25/2023   Encounter for screening mammogram for malignant neoplasm of breast 03/25/2023   COVID-19 11/26/2022   Chronic bilateral low back pain with right-sided sciatica 09/15/2022   Arthritis pain 09/15/2022   Fibromyalgia 09/15/2022   Chronic pain syndrome 09/15/2022   Bilateral sacroiliitis (HCC) 09/15/2022   Postmenopause 08/09/2022   Pacemaker 01/07/2021   Fatigue 11/13/2020   DDD (degenerative disc disease), lumbar 11/13/2020   Avascular necrosis of bone of hip, right (HCC) 11/13/2020   Sinus node dysfunction (HCC) 11/13/2020   Thrombocytosis 11/13/2020   Anxiety 11/13/2020   Attention deficit hyperactivity disorder (ADHD) 11/13/2020    PCP: [name], NP    REFERRING PROVIDER: Elijah Birk, DO  REFERRING DIAG: Chronic bilateral LBP with Rt sided sciatica; fibromyalgia; chronic pain syndrome; bilateral sacroiliitis  Rationale for Evaluation and Treatment: Rehabilitation  THERAPY DIAG:  Other low back pain  Pain in right hip  Sciatica, right side  Chronic pain of right knee  ONSET DATE:  SUBJECTIVE:  SUBJECTIVE STATEMENT: Pt reports pain is more in the low back today and is a 6/10 NPS. Pt states this could be caused by increased walking recently while she is at work.    EVAL: Pt states that she has been dealing with pain for atleast 20 years, but in the past 5 years its gotten worse with falls, injuries, etc. She was started on Gabapentin and feels this has helped her better pinpoint her pain. Pain is sacrum into the Rt buttock and lateral thigh down around the knee. She has numbness in the lateral foot. She has had PT several times in the past. She is interested in aquatic PT because it's the only thing she hasn't done to help the pain.  Pain is often so bad that she can't get out  of bed.   PERTINENT HISTORY:  Rt hip avascular necrosis of hip Anxiety  DDD Fibromyalgia  Rheumatoid Arthritis  Lt shoulder arthroplasty Rt total knee replacement and revision 2020 Pace maker   PAIN:  Are you having pain? Yes: NPRS scale: 6/10 Pain location: sacrum and Rt buttock, Rt LE/lateral knee Pain description: constant  Aggravating factors: sitting, supine, prolonged activity/walking  Relieving factors: avoiding prolonged activity   PRECAUTIONS: None  WEIGHT BEARING RESTRICTIONS: No  FALLS:  Has patient fallen in last 6 months? No  LIVING ENVIRONMENT: Lives with: lives with their family Lives in: House/apartment Stairs: No Has following equipment at home: None  OCCUPATION: Phlebotomist   PLOF: Independent  PATIENT GOALS: decrease pain  NEXT MD VISIT: f/u on MRI soon  OBJECTIVE:   DIAGNOSTIC FINDINGS:  MRI ordered   PATIENT SURVEYS:  Need next visit  SCREENING FOR RED FLAGS: Bowel or bladder incontinence: No Spinal tumors: No Cauda equina syndrome: No Compression fracture: No Abdominal aneurysm: No  COGNITION: Overall cognitive status: Within functional limits for tasks assessed     SENSATION: Pt notes numbness   MUSCLE LENGTH: Hamstrings: Right WNL deg; Left WNL deg Maisie Fus test: Right (+) Rt for quad and hip flexor  POSTURE: No Significant postural limitations  PALPATION: Tenderness Rt lateral gastroc/peroneals, Rt lateral quad/ITB, Rt glutes  LUMBAR ROM:   AROM eval  Flexion   Extension   Right lateral flexion   Left lateral flexion   Right rotation   Left rotation    (Blank rows = not tested)  LOWER EXTREMITY ROM:     Active  Right 04/29/23 Left 04/29/23  Hip flexion 110* 110  Hip extension    Hip abduction    Hip adduction    Hip internal rotation 41* 52  Hip external rotation 25 45  Knee flexion    Knee extension    Ankle dorsiflexion    Ankle plantarflexion    Ankle inversion    Ankle eversion     (Blank rows  = not tested)  LOWER EXTREMITY MMT:    MMT Right eval Left eval Right  04/22/23 Left  04/22/23 Right  06/10/23 Left 06/10/23  Hip flexion 4 4      Hip extension 3 4 3+  4 4  Hip abduction 3 4 4      Hip adduction        Hip internal rotation        Hip external rotation        Knee flexion 5 5      Knee extension 4 5      Ankle dorsiflexion        Ankle plantarflexion Heel raise unable Heel raise  x6 Heel raise x6 Heel raise x7 x7 x9  Ankle inversion        Ankle eversion         (Blank rows = not tested)  LUMBAR SPECIAL TESTS:  Next visit  FUNCTIONAL TESTS:  5 times sit to stand: 18 sec, weight shifted Lt  Timed up and go (TUG): 11 sec 6 MWT: 1,046'  GAIT: Distance walked: 74ft  Assistive device utilized: None Level of assistance: Complete Independence Comments: decreased stance on Rt, decreased step length on Lt, decreased push off on Rt    TODAY'S TREATMENT:   Date: 06/24/23  There.Ex:  Nustep L2 x to increase lumbar and LE mobility  Side stepping w/ GTB 1x10' then w/ 6# medicine ball 2 x 10'   Alternating lunges on 6" step w/ 4# weight in bilat UE 's 2 x10  Standing RLE/LLE hip abduction at MATRIX machine LLE (55#), RLE (45#) 2 x8/ each side   Farmer's carry w/ 10# in single UE: RUE 2 x100', LUE 2x 100'   Supine glute bridges w/ GTB 3 x12 w/ 3 sec hold.   PATIENT EDUCATION:  Education details: eval findings/POC; implemented HEP; benefits of aquatic PT; chronic pain response Person educated: Patient Education method: Explanation and Handouts Education comprehension: verbalized understanding and returned demonstration  HOME EXERCISE PROGRAM: Access Code: [email or web address] URL: [email or web address] Date: 04/22/2023 Prepared by: Ronnie Derby  Exercises - Modified Erby Pian  - 2 x daily - 7 x weekly - 2 sets - 20 seconds hold - Heel Raises with Counter Support with Brace On  - 3 x daily - 7 x weekly - 10 reps - Supine Bridge  - 1 x daily  - 7 x weekly - 3 sets - 8 reps - Supine Posterior Pelvic Tilt  - 1 x daily - 7 x weekly - 2 sets - 20 reps - Supine March with Posterior Pelvic Tilt  - 1 x daily - 7 x weekly - 2 sets - 8 reps   Access Code: [email or web address] URL: [email or web address] Date: 03/04/2023 Prepared by: [facility] - Outpatient Rehab - [facility]  Exercises - Modified Maisie Fus Stretch  - 2 x daily - 7 x weekly - 2 sets - 20 seconds hold - Heel Raises with Counter Support with Brace On  - 3 x daily - 7 x weekly - 10 reps  ASSESSMENT:  CLINICAL IMPRESSION: Session focused on progressing RLE and lumbar mobility/ strengthening exercises. Pt displays improvement in RLE strength seen with improvements in functional mobility noted with exercises performed at today's session. Pt also notes improvements with lumbar and RLE pain noted w/ increased activity tolerance with functional activities at work and home, however addresses difficulty w/ prolonged walking still. Pt would continue to benefit from skilled PT interventions to address remaining deficits in strength, ROM and pain to return to PLOF.   OBJECTIVE IMPAIRMENTS: Abnormal gait, decreased activity tolerance, decreased balance, decreased endurance, decreased strength, impaired flexibility, and pain.   ACTIVITY LIMITATIONS: lifting, bending, sitting, standing, squatting, stairs, locomotion level, and caring for others  PARTICIPATION LIMITATIONS: cleaning, laundry, interpersonal relationship, shopping, community activity, occupation, and yard work  PERSONAL FACTORS: Age, Fitness, Past/current experiences, Time since onset of injury/illness/exacerbation, and 3+ comorbidities: Rt hip avascular necrosis, fibromyalgia, Rt TKA with revision  are also affecting patient's functional outcome.   REHAB POTENTIAL: Good  CLINICAL DECISION MAKING: Evolving/moderate complexity  EVALUATION COMPLEXITY: Moderate   GOALS: Goals reviewed with patient?  Yes  SHORT TERM GOALS: Target  date: 03/11/23  Pt will be independent with her initial HEP to improve LE strength and flexibility. Baseline: ; 04/22/23: 75-85% compliance Goal status: In progress    LONG TERM GOALS: Target date: 06/10/23  Pt will report atleast 40% improvement in her activity participation from the start of PT. Baseline:  ; 04/22/23: 20% improvement in activity for exercise (walking, playing with niece's kids); 06/10/23: 20% Goal status: In progress  2.  Pt will have improved hip strength to atleast 4/5 MMT which will increase efficiency with daily activity. Baseline: ; 04/22/23: hip extension 3+; 06/10/23: 4/5 MMT Goal status: MET  3.  Pt will have improved Rt hip flexibility evident by negative thomas test.  Baseline: ; 04/22/23: negative Goal status: MET  4.  Pt will have improved gastroc strength bilaterally, able to complete atleast 15 consecutive single leg heel raises. Baseline: 04/22/23: RLE: 6, LLE: 7; 06/10/23: LLE: 9, RLE: 7  Goal status: In progress  5.  Pt will be independent with an advanced HEP to increase  Baseline: ;Deferred; incomplete written goal from eval paperwork Goal status: Deferred  6. Pt will improve 6 MWT by at least 164' indicating clinically significant improvement in community ambulation and work distances.  Baseline: 04/29/23: 1,046'; 06/10/23: 1,200' Goal Status: In progress  PLAN:  PT FREQUENCY: 2x/week  PT DURATION: 6 weeks  PLANNED INTERVENTIONS: Therapeutic exercises, Therapeutic activity, Neuromuscular re-education, Balance training, Gait training, Patient/Family education, Self Care, Joint mobilization, Aquatic Therapy, Dry Needling, Moist heat, Taping, Manual therapy, and Re-evaluation.  PLAN FOR NEXT SESSION: Update HEP if new exercises at session tolerated well, teach TA activation.   Lovie Macadamia, SPT  Delphia Grates. Fairly IV, PT, DPT Physical Therapist- [facility]  06/24/23, 12:28 PM

## 2023-06-29 ENCOUNTER — Encounter
Payer: BLUE CROSS/BLUE SHIELD | Attending: Physical Medicine and Rehabilitation | Admitting: Physical Medicine and Rehabilitation

## 2023-07-01 ENCOUNTER — Ambulatory Visit: Payer: BLUE CROSS/BLUE SHIELD

## 2023-07-01 ENCOUNTER — Ambulatory Visit: Payer: No Typology Code available for payment source | Admitting: Nurse Practitioner

## 2023-07-08 ENCOUNTER — Other Ambulatory Visit: Payer: Self-pay | Admitting: Nurse Practitioner

## 2023-07-08 ENCOUNTER — Other Ambulatory Visit: Payer: Self-pay | Admitting: Medical Genetics

## 2023-07-08 ENCOUNTER — Ambulatory Visit: Payer: BLUE CROSS/BLUE SHIELD

## 2023-07-08 ENCOUNTER — Encounter: Payer: Self-pay | Admitting: Oncology

## 2023-07-08 DIAGNOSIS — M25551 Pain in right hip: Secondary | ICD-10-CM

## 2023-07-08 DIAGNOSIS — Z006 Encounter for examination for normal comparison and control in clinical research program: Secondary | ICD-10-CM

## 2023-07-08 DIAGNOSIS — M5431 Sciatica, right side: Secondary | ICD-10-CM

## 2023-07-08 DIAGNOSIS — M5459 Other low back pain: Secondary | ICD-10-CM | POA: Diagnosis not present

## 2023-07-08 DIAGNOSIS — F419 Anxiety disorder, unspecified: Secondary | ICD-10-CM

## 2023-07-08 DIAGNOSIS — G8929 Other chronic pain: Secondary | ICD-10-CM

## 2023-07-08 MED ORDER — CLONAZEPAM 1 MG PO TABS
1.0000 mg | ORAL_TABLET | Freq: Two times a day (BID) | ORAL | 0 refills | Status: AC | PRN
Start: 2023-07-08 — End: ?

## 2023-07-08 NOTE — Therapy (Addendum)
OUTPATIENT PHYSICAL THERAPY THORACOLUMBAR TREATMENT    Patient Name: Diana Giles MRN: [id number] DOB:Feb 01, 1971, 52 y.o., female Today's Date: 07/08/2023   END OF SESSION:  PT End of Session - 07/08/23 1019     Visit Number 17    Number of Visits 26    Date for PT Re-Evaluation 07/22/23    Authorization Type [employer]    PT Start Time 1020    PT Stop Time 1103    PT Time Calculation (min) 43 min    Activity Tolerance Patient tolerated treatment well    Behavior During Therapy WFL for tasks assessed/performed                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    Mental disorder    Rheumatoid arthritis (HCC)    Sinus node dysfunction (HCC)    s/p pacemaker   Thrombocytosis    Vaginal Pap smear, abnormal    Past Surgical History:  Procedure Laterality Date   CESAREAN SECTION  06/02/94   GASTRIC BYPASS  2010   285lbs at highest weight   HYSTEROSCOPY WITH D & C  08/22/2019   JOINT REPLACEMENT  05/2019   Revision Right knee   OTHER SURGICAL HISTORY  08/2019   Bilateral fallopian tube removal   PACEMAKER PLACEMENT  08/06/2020   REVISION TOTAL KNEE ARTHROPLASTY Right 06/01/2019   SHOULDER ARTHROSCOPY Left 2018   TUBAL LIGATION Bilateral 08/07/2019   Patient Active Problem List   Diagnosis Date Noted   Iron deficiency anemia, unspecified 04/29/2023   Anemia 04/15/2023   Abnormal TSH 04/15/2023   Generalized abdominal pain 03/25/2023   Change in nail appearance 03/25/2023   Diabetes mellitus screening 03/25/2023   Encounter for lipid screening for cardiovascular disease 03/25/2023   Encounter for hepatitis C screening test for low risk patient  03/25/2023   Encounter for screening mammogram for malignant neoplasm of breast 03/25/2023   COVID-19 11/26/2022   Chronic bilateral low back pain with right-sided sciatica 09/15/2022   Arthritis pain 09/15/2022   Fibromyalgia 09/15/2022   Chronic pain syndrome 09/15/2022   Bilateral sacroiliitis (HCC) 09/15/2022   Postmenopause 08/09/2022   Pacemaker 01/07/2021   Fatigue 11/13/2020   DDD (degenerative disc disease), lumbar 11/13/2020   Avascular necrosis of bone of hip, right (HCC) 11/13/2020   Sinus node dysfunction (HCC) 11/13/2020   Thrombocytosis 11/13/2020   Anxiety 11/13/2020   Attention deficit hyperactivity disorder (ADHD) 11/13/2020    PCP: [name], NP    REFERRING PROVIDER: Elijah Birk, DO  REFERRING DIAG: Chronic bilateral LBP with Rt sided sciatica; fibromyalgia; chronic pain syndrome; bilateral sacroiliitis  Rationale for Evaluation and Treatment: Rehabilitation  THERAPY DIAG:  Other low back pain  Pain in right hip  Sciatica, right side  Chronic pain of right knee  ONSET DATE:  SUBJECTIVE:  SUBJECTIVE STATEMENT: Pt reports R hip pain as 7/10NPS today. She expresses concerns of R arm pain with insidious onset over the last few days.      EVAL: Pt states that she has been dealing with pain for atleast 20 years, but in the past 5 years its gotten worse with falls, injuries, etc. She was started on Gabapentin and feels this has helped her better pinpoint her pain. Pain is sacrum into the Rt buttock and lateral thigh down around the knee. She has numbness in the lateral foot. She has had PT several times in the past. She is interested in aquatic PT because it's the only thing she hasn't done to help the pain.  Pain is often so bad that she can't get out of bed.   PERTINENT  HISTORY:  Rt hip avascular necrosis of hip Anxiety  DDD Fibromyalgia  Rheumatoid Arthritis  Lt shoulder arthroplasty Rt total knee replacement and revision 2020 Pace maker   PAIN:  Are you having pain? Yes: NPRS scale: 7/10 Pain location: sacrum and Rt buttock, Rt LE/lateral knee Pain description: constant  Aggravating factors: sitting, supine, prolonged activity/walking  Relieving factors: avoiding prolonged activity   PRECAUTIONS: None  WEIGHT BEARING RESTRICTIONS: No  FALLS:  Has patient fallen in last 6 months? No  LIVING ENVIRONMENT: Lives with: lives with their family Lives in: House/apartment Stairs: No Has following equipment at home: None  OCCUPATION: Phlebotomist   PLOF: Independent  PATIENT GOALS: decrease pain  NEXT MD VISIT: f/u on MRI soon  OBJECTIVE:   DIAGNOSTIC FINDINGS:  MRI ordered   PATIENT SURVEYS:  Need next visit  SCREENING FOR RED FLAGS: Bowel or bladder incontinence: No Spinal tumors: No Cauda equina syndrome: No Compression fracture: No Abdominal aneurysm: No  COGNITION: Overall cognitive status: Within functional limits for tasks assessed     SENSATION: Pt notes numbness   MUSCLE LENGTH: Hamstrings: Right WNL deg; Left WNL deg Maisie Fus test: Right (+) Rt for quad and hip flexor  POSTURE: No Significant postural limitations  PALPATION: Tenderness Rt lateral gastroc/peroneals, Rt lateral quad/ITB, Rt glutes  LUMBAR ROM:   AROM eval  Flexion   Extension   Right lateral flexion   Left lateral flexion   Right rotation   Left rotation    (Blank rows = not tested)  LOWER EXTREMITY ROM:     Active  Right 04/29/23 Left 04/29/23  Hip flexion 110* 110  Hip extension    Hip abduction    Hip adduction    Hip internal rotation 41* 52  Hip external rotation 25 45  Knee flexion    Knee extension    Ankle dorsiflexion    Ankle plantarflexion    Ankle inversion    Ankle eversion     (Blank rows = not tested)  LOWER  EXTREMITY MMT:    MMT Right eval Left eval Right  04/22/23 Left  04/22/23 Right  06/10/23 Left 06/10/23  Hip flexion 4 4      Hip extension 3 4 3+  4 4  Hip abduction 3 4 4      Hip adduction        Hip internal rotation        Hip external rotation        Knee flexion 5 5      Knee extension 4 5      Ankle dorsiflexion        Ankle plantarflexion Heel raise unable Heel raise x6 Heel raise x6 Heel  raise x7 x7 x9  Ankle inversion        Ankle eversion         (Blank rows = not tested)  LUMBAR SPECIAL TESTS:  Next visit  FUNCTIONAL TESTS:  5 times sit to stand: 18 sec, weight shifted Lt  Timed up and go (TUG): 11 sec 6 MWT: 1,046'  GAIT: Distance walked: 50ft  Assistive device utilized: None Level of assistance: Complete Independence Comments: decreased stance on Rt, decreased step length on Lt, decreased push off on Rt    TODAY'S TREATMENT:   Date: 07/08/23  There.Ex:  Nustep L2 x to increase lumbar and LE mobility  Side stepping w/ Blue TB 2 x10' down and back  Alternating lunges on second step RLE/LLE w/ 4# weight in bilat UE 's 2 x10/ each side  Standing RLE/LLE hip abduction at MATRIX machine LLE (40#), RLE (25#) 2 x10/ each side   Squats with 3KG medicine ball to elevated plinth height 2 x10   Sidelying clamshells RLE/ LLE  w/ GTB 2 x12/ each side  PATIENT EDUCATION:  Education details: eval findings/POC; implemented HEP; benefits of aquatic PT; chronic pain response Person educated: Patient Education method: Chief Technology Officer Education comprehension: verbalized understanding and returned demonstration  HOME EXERCISE PROGRAM: Access Code: [email or web address] URL: [email or web address] Date: 04/22/2023 Prepared by: Ronnie Derby  Exercises - Modified Erby Pian  - 2 x daily - 7 x weekly - 2 sets - 20 seconds hold - Heel Raises with Counter Support with Brace On  - 3 x daily - 7 x weekly - 10 reps - Supine Bridge  - 1 x daily - 7 x  weekly - 3 sets - 8 reps - Supine Posterior Pelvic Tilt  - 1 x daily - 7 x weekly - 2 sets - 20 reps - Supine March with Posterior Pelvic Tilt  - 1 x daily - 7 x weekly - 2 sets - 8 reps   Access Code: [email or web address] URL: [email or web address] Date: 03/04/2023 Prepared by: [facility] - Outpatient Rehab - [facility]  Exercises - Modified Maisie Fus Stretch  - 2 x daily - 7 x weekly - 2 sets - 20 seconds hold - Heel Raises with Counter Support with Brace On  - 3 x daily - 7 x weekly - 10 reps  ASSESSMENT:  CLINICAL IMPRESSION: Session focused on progressing RLE and lumbar mobility/ strengthening exercises. Pt displays improvement in RLE strength seen with improvements noted with exercises performed at today's session. Pt continues to have increased pain in the R hip, noting difficulty with pt reported functional work related tasks including prolonged walking. Pt would continue to benefit from skilled PT interventions to address remaining deficits in strength, ROM and pain to return to PLOF.   OBJECTIVE IMPAIRMENTS: Abnormal gait, decreased activity tolerance, decreased balance, decreased endurance, decreased strength, impaired flexibility, and pain.   ACTIVITY LIMITATIONS: lifting, bending, sitting, standing, squatting, stairs, locomotion level, and caring for others  PARTICIPATION LIMITATIONS: cleaning, laundry, interpersonal relationship, shopping, community activity, occupation, and yard work  PERSONAL FACTORS: Age, Fitness, Past/current experiences, Time since onset of injury/illness/exacerbation, and 3+ comorbidities: Rt hip avascular necrosis, fibromyalgia, Rt TKA with revision  are also affecting patient's functional outcome.   REHAB POTENTIAL: Good  CLINICAL DECISION MAKING: Evolving/moderate complexity  EVALUATION COMPLEXITY: Moderate   GOALS: Goals reviewed with patient? Yes  SHORT TERM GOALS: Target date: 03/11/23  Pt will be independent with her  initial HEP to improve LE strength and flexibility. Baseline: ;  04/22/23: 75-85% compliance Goal status: In progress    LONG TERM GOALS: Target date: 07/22/23  Pt will report atleast 40% improvement in her activity participation from the start of PT. Baseline:  ; 04/22/23: 20% improvement in activity for exercise (walking, playing with niece's kids); 06/10/23: 20% Goal status: In progress  2.  Pt will have improved hip strength to atleast 4/5 MMT which will increase efficiency with daily activity. Baseline: ; 04/22/23: hip extension 3+; 06/10/23: 4/5 MMT Goal status: MET  3.  Pt will have improved Rt hip flexibility evident by negative thomas test.  Baseline: ; 04/22/23: negative Goal status: MET  4.  Pt will have improved gastroc strength bilaterally, able to complete atleast 15 consecutive single leg heel raises. Baseline: 04/22/23: RLE: 6, LLE: 7; 06/10/23: LLE: 9, RLE: 7  Goal status: In progress  5.  Pt will be independent with an advanced HEP to increase  Baseline: ;Deferred; incomplete written goal from eval paperwork Goal status: Deferred  6. Pt will improve 6 MWT by at least 164' indicating clinically significant improvement in community ambulation and work distances.  Baseline: 04/29/23: 1,046'; 06/10/23: 1,200' Goal Status: In progress  PLAN:  PT FREQUENCY: 2x/week  PT DURATION: 6 weeks  PLANNED INTERVENTIONS: Therapeutic exercises, Therapeutic activity, Neuromuscular re-education, Balance training, Gait training, Patient/Family education, Self Care, Joint mobilization, Aquatic Therapy, Dry Needling, Moist heat, Taping, Manual therapy, and Re-evaluation.  PLAN FOR NEXT SESSION: Update HEP. Continue to progress R hip/ lumbar mobility and strengthening exercises   Lovie Macadamia, SPT  Delphia Grates. Fairly IV, PT, DPT Physical Therapist- [place]  [facility]  07/08/23, 11:37 AM

## 2023-07-14 ENCOUNTER — Ambulatory Visit: Payer: No Typology Code available for payment source | Admitting: Neurology

## 2023-07-14 ENCOUNTER — Encounter: Payer: Self-pay | Admitting: Neurology

## 2023-07-15 ENCOUNTER — Encounter: Payer: Self-pay | Admitting: Oncology

## 2023-07-15 ENCOUNTER — Ambulatory Visit (INDEPENDENT_AMBULATORY_CARE_PROVIDER_SITE_OTHER): Payer: No Typology Code available for payment source | Admitting: Nurse Practitioner

## 2023-07-15 ENCOUNTER — Ambulatory Visit (INDEPENDENT_AMBULATORY_CARE_PROVIDER_SITE_OTHER): Payer: No Typology Code available for payment source

## 2023-07-15 VITALS — BP 112/68 | HR 80 | Temp 98.3°F | Ht 60.0 in | Wt 202.6 lb

## 2023-07-15 DIAGNOSIS — M25511 Pain in right shoulder: Secondary | ICD-10-CM | POA: Insufficient documentation

## 2023-07-15 DIAGNOSIS — R21 Rash and other nonspecific skin eruption: Secondary | ICD-10-CM | POA: Diagnosis not present

## 2023-07-15 DIAGNOSIS — Z23 Encounter for immunization: Secondary | ICD-10-CM

## 2023-07-15 DIAGNOSIS — D649 Anemia, unspecified: Secondary | ICD-10-CM | POA: Diagnosis not present

## 2023-07-15 DIAGNOSIS — Z87892 Personal history of anaphylaxis: Secondary | ICD-10-CM | POA: Diagnosis not present

## 2023-07-15 MED ORDER — PREDNISONE 20 MG PO TABS
40.0000 mg | ORAL_TABLET | Freq: Every day | ORAL | 0 refills | Status: DC
Start: 2023-07-15 — End: 2023-08-25

## 2023-07-15 MED ORDER — NYSTATIN 100000 UNIT/GM EX CREA
1.0000 | TOPICAL_CREAM | Freq: Two times a day (BID) | CUTANEOUS | 1 refills | Status: DC
Start: 2023-07-15 — End: 2023-10-12

## 2023-07-15 MED ORDER — EPINEPHRINE 0.3 MG/0.3ML IJ SOAJ: 0.3000 mg | INTRAMUSCULAR | 2 refills | Status: AC | PRN

## 2023-07-15 NOTE — Assessment & Plan Note (Addendum)
Right shoulder pain that radiates down to elbow. Worsens with pressure on area of pain, no history of recent trauma Check Xray Treat with course of prednisone If symptoms persist or worsen return to office

## 2023-07-15 NOTE — Assessment & Plan Note (Signed)
Chronic S/P iron infusion x 5 Follow-up with hematology as scheduled

## 2023-07-15 NOTE — Patient Instructions (Addendum)
Clotrimazole cream - apply to skin per package instructions

## 2023-07-15 NOTE — Assessment & Plan Note (Signed)
Chronic Epipen refill ordered today

## 2023-07-15 NOTE — Progress Notes (Deleted)
Office Visit Note  Patient: Diana Giles             Date of Birth: 12-04-1970           MRN: [id number]             PCP: [name], NP Referring: [name], NP Visit Date: 07/29/2023 Occupation: @GUAROCC @  Subjective:  No chief complaint on file.   History of Present Illness: [name] is a 52 y.o. female ***     Activities of Daily Living:  Patient reports morning stiffness for *** {minute/hour:19697}.   Patient {ACTIONS;DENIES/REPORTS:21021675::"Denies"} nocturnal pain.  Difficulty dressing/grooming: {ACTIONS;DENIES/REPORTS:21021675::"Denies"} Difficulty climbing stairs: {ACTIONS;DENIES/REPORTS:21021675::"Denies"} Difficulty getting out of chair: {ACTIONS;DENIES/REPORTS:21021675::"Denies"} Difficulty using hands for taps, buttons, cutlery, and/or writing: {ACTIONS;DENIES/REPORTS:21021675::"Denies"}  No Rheumatology ROS completed.   PMFS History:  Patient Active Problem List   Diagnosis Date Noted   History of anaphylaxis 07/15/2023   Rash 07/15/2023   Acute pain of right shoulder 07/15/2023   Iron deficiency anemia, unspecified 04/29/2023   Anemia 04/15/2023   Abnormal TSH 04/15/2023   Generalized abdominal pain 03/25/2023   Change in nail appearance 03/25/2023   Diabetes mellitus screening 03/25/2023   Encounter for lipid screening for cardiovascular disease 03/25/2023   Encounter for hepatitis C screening test for low risk patient 03/25/2023   Encounter for screening mammogram for malignant neoplasm of breast 03/25/2023   COVID-19 11/26/2022   Chronic bilateral low back pain with right-sided sciatica 09/15/2022   Arthritis pain 09/15/2022   Fibromyalgia 09/15/2022   Chronic pain syndrome 09/15/2022   Bilateral sacroiliitis (HCC) 09/15/2022   Postmenopause 08/09/2022   Pacemaker 01/07/2021   Fatigue 11/13/2020   DDD (degenerative disc disease), lumbar 11/13/2020   Avascular necrosis of bone of hip, right (HCC) 11/13/2020   Sinus node  dysfunction (HCC) 11/13/2020   Thrombocytosis 11/13/2020   Anxiety 11/13/2020   Attention deficit hyperactivity disorder (ADHD) 11/13/2020    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    Mental disorder    Rheumatoid arthritis (HCC)    Sinus node dysfunction (HCC)    s/p pacemaker   Thrombocytosis    Vaginal Pap smear, abnormal     Family History  Problem Relation Age of Onset   Hypertension Mother    Stroke Mother    Asthma Mother    COPD Mother    Early death Mother    Diabetes Father    Heart disease Father    Early death Father    Kidney disease Father    Hyperthyroidism Sister    COPD Sister    Asthma Sister    COPD Sister    Miscarriages / Stillbirths Sister    Obesity Brother    Hypertension Brother    Asthma Brother    Anxiety disorder Brother    Asthma Brother    Obesity Brother    Diabetes Brother    Asthma Brother    Asthma Brother    Polycystic ovary syndrome Daughter    Asthma Son    Obesity Son    ADD / ADHD Son  Colon cancer Cousin    Rectal cancer Neg Hx    Stomach cancer Neg Hx    Past Surgical History:  Procedure Laterality Date   CESAREAN SECTION  06/02/94   GASTRIC BYPASS  2010   285lbs at highest weight   HYSTEROSCOPY WITH D & C  08/22/2019   JOINT REPLACEMENT  05/2019   Revision Right knee   OTHER SURGICAL HISTORY  08/2019   Bilateral fallopian tube removal   PACEMAKER PLACEMENT  08/06/2020   REVISION TOTAL KNEE ARTHROPLASTY Right 06/01/2019   SHOULDER ARTHROSCOPY Left 2018   TUBAL LIGATION Bilateral 08/07/2019   Social History   Social History Narrative   Separated for 12 years,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   Work for [employer].   Caffiene coffee 2 cups.  Cokes 2 daily.   Immunization History  Administered Date(s) Administered   Influenza, Seasonal, Injecte, Preservative Fre 07/15/2023   Influenza,inj,Quad PF,6+ Mos 08/05/2022   Influenza-Unspecified 07/31/2020, 07/07/2021   PFIZER(Purple Top)SARS-COV-2 Vaccination 02/09/2020, 03/01/2020, 09/18/2020   Tdap 12/17/2020   Zoster Recombinant(Shingrix) 09/23/2020     Objective: Vital Signs: There were no vitals taken for this visit.   Physical Exam   Musculoskeletal Exam: ***  CDAI Exam: CDAI Score: -- Patient Global: --; Provider Global: -- Swollen: --; Tender: -- Joint Exam 07/29/2023   No joint exam has been documented for this visit   There is currently no information documented on the homunculus. Go to the Rheumatology activity and complete the homunculus joint exam.  Investigation: No additional findings.  Imaging: No results found.  Recent Labs: Lab Results  Component Value Date   WBC 6.7 04/05/2023   HGB 10.9 (L) 04/05/2023   PLT 378 04/05/2023   NA 139 04/15/2023   K 4.1 04/15/2023   CL 104 04/15/2023   CO2 28 04/15/2023   GLUCOSE 80 04/15/2023   BUN 17 04/15/2023   CREATININE 0.78 04/15/2023   BILITOT 0.4 04/15/2023   ALKPHOS 120 (H) 04/15/2023   AST 16 04/15/2023   ALT 11 04/15/2023   PROT 7.3 04/15/2023   ALBUMIN 4.1 04/15/2023   CALCIUM 8.8 04/15/2023   GFRAA 121 04/09/2021    Speciality Comments: No specialty comments available.  Procedures:  No procedures performed Allergies: Nickel, Penicillins, Shellfish allergy, and Sulfa antibiotics   Assessment / Plan:     Visit Diagnoses: No diagnosis found.  Orders: No orders of the defined types were placed in this encounter.  No orders of the defined types were placed in this encounter.   Face-to-face time spent with patient was *** minutes. Greater than 50% of time was spent in counseling and coordination of  care.  Follow-Up Instructions: No follow-ups on file.   Ellen Henri, CMA  Note - This record has been created using Animal nutritionist.  Chart creation errors have been sought, but may not always  have been located. Such creation errors do not reflect on  the standard of medical care.

## 2023-07-15 NOTE — Progress Notes (Signed)
Established Patient Office Visit  Subjective   Patient ID: Diana Giles, female    DOB: [birth year]  Age: 52 y.o. MRN: [id number]  Chief Complaint  Patient presents with   Follow-up    6 month f/u. Pt states having a rash that comes and goes on her left hip joint. Pt also c/o of right shoulder pain that moves to her elbow, that has been going on for a week. Pt did mention starting CPAP for 1 or 2 months.    Rash: under skin folds of abdomen. Close to her hip.  Right shoulder pain: acute, onset 2-3 weeks ago. No known trauma. Does sleep on the arm with shoulder raised. Using tylenol, heat, cold. No improvement in pain.  Anaphylaxis: needs refill of her epi-pen Anemia: Has completed iron infusions x 5 with hematology. Due to follow-up with them again next month. Has been feeling a bit fatigued lately but nothing severe.      ROS: see HPI    Objective:     BP 112/68 (BP Location: Left Arm, Patient Position: Sitting, Cuff Size: Normal)   Pulse 80   Temp 98.3 F (36.8 C) (Oral)   Ht 5' (1.524 m)   Wt 202 lb 9.6 oz (91.9 kg)   SpO2 97%   BMI 39.57 kg/m    Physical Exam Vitals reviewed.  Constitutional:      General: She is not in acute distress.    Appearance: Normal appearance.  HENT:     Head: Normocephalic and atraumatic.  Neck:     Vascular: No carotid bruit.  Cardiovascular:     Rate and Rhythm: Normal rate and regular rhythm.     Pulses: Normal pulses.     Heart sounds: Normal heart sounds.  Pulmonary:     Effort: Pulmonary effort is normal.     Breath sounds: Normal breath sounds.  Musculoskeletal:     Right shoulder: Tenderness present. No swelling or deformity.     Right elbow: No swelling or deformity. Normal range of motion.     Comments: Right shoulder tender to touch, some pain with forward flexion  Skin:    General: Skin is warm and dry.       Neurological:     General: No focal deficit present.     Mental Status: She is alert and oriented to  person, place, and time.  Psychiatric:        Mood and Affect: Mood normal.        Behavior: Behavior normal.        Judgment: Judgment normal.      No results found for any visits on 07/15/23.    The 10-year ASCVD risk score (Arnett DK, et al., 2019) is: 0.7%    Assessment & Plan:   Problem List Items Addressed This Visit       Musculoskeletal and Integument   Rash - Primary    Acute Appears consistent with either yeast or fungal overgrowth Recommend treatment with clotrimazole if no improvement trial nystatin Call office if symptoms persist      Relevant Medications   nystatin cream (MYCOSTATIN)     Other   Anemia    Chronic S/P iron infusion x 5 Follow-up with hematology as scheduled       History of anaphylaxis    Chronic Epipen refill ordered today      Relevant Medications   EPINEPHrine 0.3 mg/0.3 mL IJ SOAJ injection   Acute pain of right shoulder  Right shoulder pain that radiates down to elbow. Worsens with pressure on area of pain, no history of recent trauma Check Xray Treat with course of prednisone If symptoms persist or worsen return to office      Relevant Medications   predniSONE (DELTASONE) 20 MG tablet   Other Relevant Orders   DG Shoulder Right   Other Visit Diagnoses     Need for vaccination       Relevant Orders   Flu vaccine trivalent PF, 6mos and older(Flulaval,Afluria,Fluarix,Fluzone) (Completed)       Return in about 3 months (around 10/14/2023) for F/U with Joan Avetisyan.    [name], NP

## 2023-07-15 NOTE — Assessment & Plan Note (Signed)
Acute Appears consistent with either yeast or fungal overgrowth Recommend treatment with clotrimazole if no improvement trial nystatin Call office if symptoms persist

## 2023-07-22 ENCOUNTER — Ambulatory Visit: Payer: PRIVATE HEALTH INSURANCE | Attending: Physical Medicine and Rehabilitation

## 2023-07-22 DIAGNOSIS — M5431 Sciatica, right side: Secondary | ICD-10-CM | POA: Diagnosis present

## 2023-07-22 DIAGNOSIS — M5459 Other low back pain: Secondary | ICD-10-CM | POA: Diagnosis present

## 2023-07-22 DIAGNOSIS — M25551 Pain in right hip: Secondary | ICD-10-CM | POA: Insufficient documentation

## 2023-07-22 NOTE — Therapy (Addendum)
OUTPATIENT PHYSICAL THERAPY THORACOLUMBAR TREATMENT/ DISCHARGE SUMMARY    Patient Name: Diana Giles MRN: [id number] DOB:1971/07/15, 52 y.o., female Today's Date: 07/22/2023   END OF SESSION:  PT End of Session - 07/22/23 0856     Visit Number 18    Number of Visits 26    Date for PT Re-Evaluation 07/22/23    Authorization Type [employer]    PT Start Time 0900    PT Stop Time 0941    PT Time Calculation (min) 41 min    Activity Tolerance Patient tolerated treatment well    Behavior During Therapy WFL for tasks assessed/performed                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    Mental disorder    Rheumatoid arthritis (HCC)    Sinus node dysfunction (HCC)    s/p pacemaker   Thrombocytosis    Vaginal Pap smear, abnormal    Past Surgical History:  Procedure Laterality Date   CESAREAN SECTION  06/02/94   GASTRIC BYPASS  2010   285lbs at highest weight   HYSTEROSCOPY WITH D & C  08/22/2019   JOINT REPLACEMENT  05/2019   Revision Right knee   OTHER SURGICAL HISTORY  08/2019   Bilateral fallopian tube removal   PACEMAKER PLACEMENT  08/06/2020   REVISION TOTAL KNEE ARTHROPLASTY Right 06/01/2019   SHOULDER ARTHROSCOPY Left 2018   TUBAL LIGATION Bilateral 08/07/2019   Patient Active Problem List   Diagnosis Date Noted   History of anaphylaxis 07/15/2023   Rash 07/15/2023   Acute pain of right shoulder 07/15/2023   Iron deficiency anemia, unspecified 04/29/2023   Anemia 04/15/2023   Abnormal TSH 04/15/2023   Generalized abdominal pain 03/25/2023   Change in nail appearance 03/25/2023   Diabetes mellitus screening 03/25/2023   Encounter for  lipid screening for cardiovascular disease 03/25/2023   Encounter for hepatitis C screening test for low risk patient 03/25/2023   Encounter for screening mammogram for malignant neoplasm of breast 03/25/2023   COVID-19 11/26/2022   Chronic bilateral low back pain with right-sided sciatica 09/15/2022   Arthritis pain 09/15/2022   Fibromyalgia 09/15/2022   Chronic pain syndrome 09/15/2022   Bilateral sacroiliitis (HCC) 09/15/2022   Postmenopause 08/09/2022   Pacemaker 01/07/2021   Fatigue 11/13/2020   DDD (degenerative disc disease), lumbar 11/13/2020   Avascular necrosis of bone of hip, right (HCC) 11/13/2020   Sinus node dysfunction (HCC) 11/13/2020   Thrombocytosis 11/13/2020   Anxiety 11/13/2020   Attention deficit hyperactivity disorder (ADHD) 11/13/2020    PCP: [name], NP    REFERRING PROVIDER: Elijah Birk, DO  REFERRING DIAG: Chronic bilateral LBP with Rt sided sciatica; fibromyalgia; chronic pain syndrome; bilateral sacroiliitis  Rationale for Evaluation and Treatment: Rehabilitation  THERAPY DIAG:  Other low back pain  Pain in right hip  Sciatica, right side  ONSET DATE:  SUBJECTIVE:  SUBJECTIVE STATEMENT: Pt reports R hip pain as 2-3/10NPS today. She reports that she has received steroids from her MD for her, R arm and that has significantly helped her R arm pain.     EVAL: Pt states that she has been dealing with pain for atleast 20 years, but in the past 5 years its gotten worse with falls, injuries, etc. She was started on Gabapentin and feels this has helped her better pinpoint her pain. Pain is sacrum into the Rt buttock and lateral thigh down around the knee. She has numbness in the lateral foot. She has had PT several times in the past. She is interested in aquatic PT  because it's the only thing she hasn't done to help the pain.  Pain is often so bad that she can't get out of bed.   PERTINENT HISTORY:  Rt hip avascular necrosis of hip Anxiety  DDD Fibromyalgia  Rheumatoid Arthritis  Lt shoulder arthroplasty Rt total knee replacement and revision 2020 Pace maker   PAIN:  Are you having pain? Yes: NPRS scale: 7/10 Pain location: sacrum and Rt buttock, Rt LE/lateral knee Pain description: constant  Aggravating factors: sitting, supine, prolonged activity/walking  Relieving factors: avoiding prolonged activity   PRECAUTIONS: None  WEIGHT BEARING RESTRICTIONS: No  FALLS:  Has patient fallen in last 6 months? No  LIVING ENVIRONMENT: Lives with: lives with their family Lives in: House/apartment Stairs: No Has following equipment at home: None  OCCUPATION: Phlebotomist   PLOF: Independent  PATIENT GOALS: decrease pain  NEXT MD VISIT: f/u on MRI soon  OBJECTIVE:   DIAGNOSTIC FINDINGS:  MRI ordered   PATIENT SURVEYS:  Need next visit  SCREENING FOR RED FLAGS: Bowel or bladder incontinence: No Spinal tumors: No Cauda equina syndrome: No Compression fracture: No Abdominal aneurysm: No  COGNITION: Overall cognitive status: Within functional limits for tasks assessed     SENSATION: Pt notes numbness   MUSCLE LENGTH: Hamstrings: Right WNL deg; Left WNL deg Maisie Fus test: Right (+) Rt for quad and hip flexor  POSTURE: No Significant postural limitations  PALPATION: Tenderness Rt lateral gastroc/peroneals, Rt lateral quad/ITB, Rt glutes  LUMBAR ROM:   AROM eval  Flexion   Extension   Right lateral flexion   Left lateral flexion   Right rotation   Left rotation    (Blank rows = not tested)  LOWER EXTREMITY ROM:     Active  Right 04/29/23 Left 04/29/23  Hip flexion 110* 110  Hip extension    Hip abduction    Hip adduction    Hip internal rotation 41* 52  Hip external rotation 25 45  Knee flexion    Knee  extension    Ankle dorsiflexion    Ankle plantarflexion    Ankle inversion    Ankle eversion     (Blank rows = not tested)  LOWER EXTREMITY MMT:    MMT Right eval Left eval Right  04/22/23 Left  04/22/23 Right  06/10/23 Left 06/10/23  Hip flexion 4 4      Hip extension 3 4 3+  4 4  Hip abduction 3 4 4      Hip adduction        Hip internal rotation        Hip external rotation        Knee flexion 5 5      Knee extension 4 5      Ankle dorsiflexion        Ankle plantarflexion Heel raise unable  Heel raise x6 Heel raise x6 Heel raise x7 x7 x9  Ankle inversion        Ankle eversion         (Blank rows = not tested)  LUMBAR SPECIAL TESTS:  Next visit  FUNCTIONAL TESTS:  5 times sit to stand: 18 sec, weight shifted Lt  Timed up and go (TUG): 11 sec 6 MWT: 1,046'  GAIT: Distance walked: 63ft  Assistive device utilized: None Level of assistance: Complete Independence Comments: decreased stance on Rt, decreased step length on Lt, decreased push off on Rt    TODAY'S TREATMENT:   Date: 07/22/23  Beginning of session spent reassessing pt's goals and POC for discharge. (See below)   Reviewed HEP (reps/ frequency/ sets)  There.Ex: Nustep L2 x to increase lumbar and LE mobility Supine March with Posterior Pelvic Tilt RLE/LLE 2 x8/ each side Standing Hip Abduction with bilat UE support RLE/ LLE w/ GTB 2 x8/ each side  Standing Hip extension with bilat UE support RLE/ LLE w/ GTB 2 x 8 each side Mini Squat w/ bilat UE support 2 x8   PATIENT EDUCATION:  Education details: eval findings/POC; implemented HEP; benefits of aquatic PT; chronic pain response Person educated: Patient Education method: Explanation and Handouts Education comprehension: verbalized understanding and returned demonstration  HOME EXERCISE PROGRAM: Access Code: [email or web address] URL: [email or web address] Date: 07/22/2023 Prepared by: Ronnie Derby  Exercises - Heel Raises with Counter  Support with Brace On  - 3 x daily - 3-4 x weekly - 10 reps - Supine March with Posterior Pelvic Tilt  - 1 x daily - 3-4 x weekly - 2 sets - 8 reps - Standing Hip Abduction with Counter Support  - 1 x daily - 3-4 x weekly - 3 sets - 8 reps - Standing Hip Extension with Counter Support  - 1 x daily - 3-4 x weekly - 3 sets - 8 reps - Mini Squat with Counter Support  - 1 x daily - 3-4 x weekly - 3 sets - 8 reps  ASSESSMENT:  CLINICAL IMPRESSION: Session focused on assessing objective functional measures, to update pt's LTG's as today was pt's last day of PT certification. Pt notes achievement of 6/7 of her goals, including improved ability to complete 15 consecutive single leg heel raises bilaterally, increasing hip strength MMT, improving activity participation, and being HEP compliant. However, pt did not meet her goal of improving her with this assessment decreasing by 32ft since the last time this test was administered. Pt and PT discussed pt's ability to continue to make functional gains IND at home w/ updated HEP and agreed that today would be the pt's final PT visit for this case. Pt's updated HEP was reviewed and pt demonstrated comprehension of additional exercises added. Pt was discharged at today's session.   OBJECTIVE IMPAIRMENTS: Abnormal gait, decreased activity tolerance, decreased balance, decreased endurance, decreased strength, impaired flexibility, and pain.   ACTIVITY LIMITATIONS: lifting, bending, sitting, standing, squatting, stairs, locomotion level, and caring for others  PARTICIPATION LIMITATIONS: cleaning, laundry, interpersonal relationship, shopping, community activity, occupation, and yard work  PERSONAL FACTORS: Age, Fitness, Past/current experiences, Time since onset of injury/illness/exacerbation, and 3+ comorbidities: Rt hip avascular necrosis, fibromyalgia, Rt TKA with revision  are also affecting patient's functional outcome.   REHAB POTENTIAL: Good  CLINICAL  DECISION MAKING: Evolving/moderate complexity  EVALUATION COMPLEXITY: Moderate   GOALS: Goals reviewed with patient? Yes  SHORT TERM GOALS: Target date: 03/11/23  Pt will be independent with her  initial HEP to improve LE strength and flexibility. Baseline: ; 04/22/23: 75-85% compliance 07/22/23: HEP compliant Goal status: MET   LONG TERM GOALS: Target date: 07/22/23  Pt will report atleast 40% improvement in her activity participation from the start of PT. Baseline:  ; 04/22/23: 20% improvement in activity for exercise (walking, playing with niece's kids); 06/10/23: 20% 07/22/23: 70% improvement.  Goal status: MET  2.  Pt will have improved hip strength to atleast 4/5 MMT which will increase efficiency with daily activity. Baseline: ; 04/22/23: hip extension 3+; 06/10/23: 4/5 MMT Goal status: MET  3.  Pt will have improved Rt hip flexibility evident by negative thomas test.  Baseline: ; 04/22/23: negative Goal status: MET  4.  Pt will have improved gastroc strength bilaterally, able to complete atleast 15 consecutive single leg heel raises. Baseline: 04/22/23: RLE: 6, LLE: 7; 06/10/23: LLE: 9, RLE: 7 07/22/23: LLE: 15 RLE: 15 Goal status: MET  5.  Pt will be independent with an advanced HEP to increase  Baseline: ;Deferred; incomplete written goal from eval paperwork 07/22/23: New HEP given to pt, demonstrated comprehension Goal status: MET  6. Pt will improve 6 MWT by at least 164' indicating clinically significant improvement in community ambulation and work distances.  Baseline: 04/29/23: 1,046'; 06/10/23: 1,200' 07/22/23: 1,140' Goal Status: NOT MET   PLAN:  PT FREQUENCY: 2x/week  PT DURATION: 6 weeks  PLANNED INTERVENTIONS: Therapeutic exercises, Therapeutic activity, Neuromuscular re-education, Balance training, Gait training, Patient/Family education, Self Care, Joint mobilization, Aquatic Therapy, Dry Needling, Moist heat, Taping, Manual therapy, and Re-evaluation.  PLAN FOR NEXT  SESSION: Pt discharged at today's session.    Lovie Macadamia, SPT  Delphia Grates. Fairly IV, PT, DPT Physical Therapist- [place]  [facility]  07/22/23, 10:31 AM

## 2023-07-29 ENCOUNTER — Ambulatory Visit: Payer: PRIVATE HEALTH INSURANCE

## 2023-07-29 ENCOUNTER — Ambulatory Visit: Payer: No Typology Code available for payment source | Admitting: Rheumatology

## 2023-07-29 DIAGNOSIS — M19042 Primary osteoarthritis, left hand: Secondary | ICD-10-CM

## 2023-07-29 DIAGNOSIS — R5383 Other fatigue: Secondary | ICD-10-CM

## 2023-07-29 DIAGNOSIS — M87051 Idiopathic aseptic necrosis of right femur: Secondary | ICD-10-CM

## 2023-07-29 DIAGNOSIS — I495 Sick sinus syndrome: Secondary | ICD-10-CM

## 2023-07-29 DIAGNOSIS — M19071 Primary osteoarthritis, right ankle and foot: Secondary | ICD-10-CM

## 2023-07-29 DIAGNOSIS — Z9884 Bariatric surgery status: Secondary | ICD-10-CM

## 2023-07-29 DIAGNOSIS — Z950 Presence of cardiac pacemaker: Secondary | ICD-10-CM

## 2023-07-29 DIAGNOSIS — M7701 Medial epicondylitis, right elbow: Secondary | ICD-10-CM

## 2023-07-29 DIAGNOSIS — D75839 Thrombocytosis, unspecified: Secondary | ICD-10-CM

## 2023-07-29 DIAGNOSIS — M7061 Trochanteric bursitis, right hip: Secondary | ICD-10-CM

## 2023-07-29 DIAGNOSIS — G8929 Other chronic pain: Secondary | ICD-10-CM

## 2023-07-29 DIAGNOSIS — M5136 Other intervertebral disc degeneration, lumbar region: Secondary | ICD-10-CM

## 2023-07-29 DIAGNOSIS — Z96651 Presence of right artificial knee joint: Secondary | ICD-10-CM

## 2023-07-29 DIAGNOSIS — M1712 Unilateral primary osteoarthritis, left knee: Secondary | ICD-10-CM

## 2023-07-29 DIAGNOSIS — M797 Fibromyalgia: Secondary | ICD-10-CM

## 2023-07-29 DIAGNOSIS — Z8659 Personal history of other mental and behavioral disorders: Secondary | ICD-10-CM

## 2023-08-05 ENCOUNTER — Ambulatory Visit: Payer: No Typology Code available for payment source

## 2023-08-05 DIAGNOSIS — I495 Sick sinus syndrome: Secondary | ICD-10-CM

## 2023-08-06 LAB — CUP PACEART REMOTE DEVICE CHECK
Battery Remaining Longevity: 148 mo
Battery Voltage: 3.03 V
Brady Statistic AP VP Percent: 0.03 %
Brady Statistic AP VS Percent: 8.02 %
Brady Statistic AS VP Percent: 0.03 %
Brady Statistic AS VS Percent: 91.91 %
Brady Statistic RA Percent Paced: 8.07 %
Brady Statistic RV Percent Paced: 0.07 %
Date Time Interrogation Session: 20240927074914
Implantable Lead Connection Status: 753985
Implantable Lead Connection Status: 753985
Implantable Lead Implant Date: 20210928
Implantable Lead Implant Date: 20210928
Implantable Lead Location: 753859
Implantable Lead Location: 753860
Implantable Lead Model: 4076
Implantable Lead Model: 4076
Implantable Pulse Generator Implant Date: 20210928
Lead Channel Impedance Value: 285 Ohm
Lead Channel Impedance Value: 361 Ohm
Lead Channel Impedance Value: 380 Ohm
Lead Channel Impedance Value: 437 Ohm
Lead Channel Pacing Threshold Amplitude: 0.625 V
Lead Channel Pacing Threshold Amplitude: 0.625 V
Lead Channel Pacing Threshold Pulse Width: 0.4 ms
Lead Channel Pacing Threshold Pulse Width: 0.4 ms
Lead Channel Sensing Intrinsic Amplitude: 1 mV
Lead Channel Sensing Intrinsic Amplitude: 1 mV
Lead Channel Sensing Intrinsic Amplitude: 7.125 mV
Lead Channel Sensing Intrinsic Amplitude: 7.125 mV
Lead Channel Setting Pacing Amplitude: 1.5 V
Lead Channel Setting Pacing Amplitude: 2 V
Lead Channel Setting Pacing Pulse Width: 0.4 ms
Lead Channel Setting Sensing Sensitivity: 0.9 mV
Zone Setting Status: 755011

## 2023-08-07 NOTE — Progress Notes (Deleted)
  Diana Giles is a 52 y.o. year old female  who  has a past medical history of Allergy, Anemia, Anxiety, Avascular necrosis of hip (HCC), Blood transfusion without reported diagnosis, Bunion of left foot, COVID-19, DDD (degenerative disc disease), lumbar, Fibromyalgia, GERD (gastroesophageal reflux disease), Herpes simplex virus (HSV) infection, HPV in female, Mental disorder, Rheumatoid arthritis (HCC), Sinus node dysfunction (HCC), Thrombocytosis, and Vaginal Pap smear, abnormal.    They are presenting to PM&R clinic as follow up for chronic pain from OA, FM, and DDD pain.  Plan from last visit: Chronic bilateral low back pain with right-sided sciatica Obtain MRI lumbar spine   Once labs are done, I will message you about increasing gabapentin   Follow up in 3 months   Fibromyalgia Start aquatherapy for gentle mobilization    Chronic pain syndrome No pain contract or narcotics prescribed today d/t lack of perceived benefit in the past on Tramadol. Trialling non-narcotic medications and interventions for now. Can re-address in the future if needed.   Bilateral sacroiliitis Had SI joint injection with Dr. Wynn Banker, without benefit     Interval Hx:  - Therapies: Finished PT 9/13, now on HEP.    - Follow ups: Unable to get MRI lumbar spine d/t pacer leads per DRI.   R shoulder pain - *** Xray from PCP showed mild AC joint spurring only.    - Falls:***  - DME:***  - Medications: Gabapentin 300 mg TID last filled 06/17/23.  Clonazepam 1 mg BID PRN w/ Diana Prows, NP.   - Other concerns: ***   *** PE: Constitution: Appropriate appearance for age. No apparent distress  +Obese Resp: No respiratory distress. No accessory muscle usage. on RA Cardio: Well perfused appearance. No peripheral edema. Abdomen: Nondistended. Nontender.   Psych: Appropriate mood and affect. Neuro: AAOx4. No apparent cognitive deficits    Neurologic Exam:   DTRs: Reflexes were 2+ in bilateral achilles,  patella, biceps, BR and triceps. Babinsky: flexor responses b/l.   Hoffmans: negative b/l Sensory exam: +Decresaed sensation in R lateral knee, extending along lateral calf and into plantar foot.  Motor exam: strength 5/5 throughout bilateral upper extremities and bilateral lower extremities Coordination: Fine motor coordination was normal.     Back MSK:  + Bilateral facet loading, + TTP paraspinals + Low back pain with b/l FABER  ***  Diana Giles is a 52 y.o. year old female  who  has a past medical history of Allergy, Anemia, Anxiety, Avascular necrosis of hip (HCC), Blood transfusion without reported diagnosis, Bunion of left foot, COVID-19, DDD (degenerative disc disease), lumbar, Fibromyalgia, GERD (gastroesophageal reflux disease), Herpes simplex virus (HSV) infection, HPV in female, Mental disorder, Rheumatoid arthritis (HCC), Sinus node dysfunction (HCC), Thrombocytosis, and Vaginal Pap smear, abnormal.   They are presenting to PM&R clinic as follow up for chronic pain from OA, FM, and DDD pain.  There are no diagnoses linked to this encounter.    MRI Lumbar spine 2022 with mild L neuroforaminal narrowing L4-5. Renal CT 04/05/23 showed degenerative spondylosis at the L4-5 and L5-S1 levels, mild to moderate in degree. On independent review, again appears mild L neuroforaminal impingement.

## 2023-08-08 ENCOUNTER — Encounter
Payer: BC Managed Care – PPO | Attending: Physical Medicine and Rehabilitation | Admitting: Physical Medicine and Rehabilitation

## 2023-08-08 DIAGNOSIS — G8929 Other chronic pain: Secondary | ICD-10-CM | POA: Insufficient documentation

## 2023-08-08 DIAGNOSIS — G894 Chronic pain syndrome: Secondary | ICD-10-CM | POA: Insufficient documentation

## 2023-08-08 DIAGNOSIS — M5441 Lumbago with sciatica, right side: Secondary | ICD-10-CM | POA: Insufficient documentation

## 2023-08-08 DIAGNOSIS — M461 Sacroiliitis, not elsewhere classified: Secondary | ICD-10-CM | POA: Insufficient documentation

## 2023-08-08 DIAGNOSIS — M797 Fibromyalgia: Secondary | ICD-10-CM | POA: Insufficient documentation

## 2023-08-09 ENCOUNTER — Other Ambulatory Visit: Payer: Self-pay | Admitting: Nurse Practitioner

## 2023-08-09 ENCOUNTER — Telehealth: Payer: Self-pay | Admitting: Neurology

## 2023-08-09 DIAGNOSIS — F419 Anxiety disorder, unspecified: Secondary | ICD-10-CM

## 2023-08-09 NOTE — Telephone Encounter (Signed)
Changed pt appt due to have been exposed to COVID, tested negative, but having symptoms.  .. Pt understands that although there may be some limitations with this type of visit, we will take all precautions to reduce any security or privacy concerns.  Pt understands that this will be treated like an in office visit and we will file with pt's insurance, and there may be a patient responsible charge related to this service.

## 2023-08-10 ENCOUNTER — Other Ambulatory Visit: Payer: Self-pay | Admitting: Nurse Practitioner

## 2023-08-10 ENCOUNTER — Telehealth: Payer: BLUE CROSS/BLUE SHIELD | Admitting: Neurology

## 2023-08-10 DIAGNOSIS — F419 Anxiety disorder, unspecified: Secondary | ICD-10-CM

## 2023-08-11 ENCOUNTER — Ambulatory Visit (INDEPENDENT_AMBULATORY_CARE_PROVIDER_SITE_OTHER): Payer: BLUE CROSS/BLUE SHIELD | Admitting: Adult Health

## 2023-08-11 ENCOUNTER — Encounter: Payer: Self-pay | Admitting: Adult Health

## 2023-08-11 VITALS — BP 138/72 | HR 82 | Ht 60.0 in | Wt 205.0 lb

## 2023-08-11 DIAGNOSIS — G4733 Obstructive sleep apnea (adult) (pediatric): Secondary | ICD-10-CM | POA: Diagnosis not present

## 2023-08-11 NOTE — Patient Instructions (Addendum)
Your Plan:  Increasing nightly CPAP usage and ensure greater than 4 hours per night  If you continue to have difficulty wearing your mask throughout the entire night, please follow up with your DME [employer] [facility] to discuss other mask options      Follow up in 6 months or call earlier if needed     Thank you for coming to see [place] at [facility]. I hope we have been able to provide you high quality care today.  You may receive a patient satisfaction survey over the next few weeks. We would appreciate your feedback and comments so that we may continue to improve ourselves and the health of our patients.

## 2023-08-11 NOTE — Progress Notes (Signed)
[facility] [address] [place]. [place] [address] [phone]       OFFICE FOLLOW UP NOTE  [name] Date of Birth:  04-Apr-1971 Medical Record Number:  [id number]    Primary neurologist: Dr. Frances Furbish Reason for visit: Initial CPAP follow-up    SUBJECTIVE:   CHIEF COMPLAINT:  Chief Complaint  Patient presents with   Follow-up    Patient in room #8 and alone. Patient states she can fall a sleep with the machine but she moves so much that the mask is on top of her head when she gets up.    Follow-up visit:  Prior visit: 09/27/2022  Brief HPI:   Diana Giles is a 52 y.o. female who was evaluated by Dr. Frances Furbish on 09/27/2022 for concern of underlying sleep apnea with reports of snoring, excessive daytime somnolence and nocturia.  Completed sleep study 05/01/2023 which showed overall mild OSA with total AHI of 12/h and O2 nadir of 77%.  AutoPap initiated 05/13/2023. DME [employer] [facility].     Interval history:  Compliance report shows poor nightly and greater than 4-hour usage although optimal residual AHI with use. Feels like she is gradually doing better, was having issues with initial nasal pillow and since changed, feels like she is doing better. At times, will remove mask while sleeping as she can be a restless sleeper. Still struggles with insomnia at times.  ESS 14/24.          ROS:   14 system review of systems performed and negative with exception of those listed in HPI  PMH:  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    Mental disorder    Rheumatoid arthritis (HCC)    Sinus node dysfunction (HCC)    s/p pacemaker    Thrombocytosis    Vaginal Pap smear, abnormal     PSH:  Past Surgical History:  Procedure Laterality Date   CESAREAN SECTION  06/02/94   GASTRIC BYPASS  2010   285lbs at highest weight   HYSTEROSCOPY WITH D & C  08/22/2019   JOINT REPLACEMENT  05/2019   Revision Right knee   OTHER SURGICAL HISTORY  08/2019   Bilateral fallopian tube removal   PACEMAKER PLACEMENT  08/06/2020   REVISION TOTAL KNEE ARTHROPLASTY Right 06/01/2019   SHOULDER ARTHROSCOPY Left 2018   TUBAL LIGATION Bilateral 08/07/2019    Social History:  Social History   Socioeconomic History   Marital status: Legally Separated    Spouse name: Not on file   Number of children: 2   Years of education: Not on file   Highest education level: Some college, no degree  Occupational History   Not on file  Tobacco Use   Smoking status: Never    Passive exposure: Past   Smokeless tobacco: Never  Vaping Use   Vaping status: Never Used  Substance and Sexual Activity   Alcohol use: Not Currently    Comment: Occasionally   Drug use: Never   Sexual activity: Not Currently    Birth control/protection: Post-menopausal, Surgical    Comment: tubal  Other  Topics Concern   Not on file  Social History Narrative   Separated for 12 years,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   Caffiene coffee 2 cups.  Cokes 2 daily.   Social Determinants of Health   Financial Resource Strain: High Risk (03/22/2023)   Overall Financial Resource Strain (CARDIA)    Difficulty of Paying Living Expenses: Hard  Food Insecurity: No Food Insecurity (04/29/2023)   Hunger Vital Sign    Worried About Running Out of Food in the Last Year: Never true    Ran Out of Food in the Last Year: Never true  Recent Concern: Food Insecurity - Food Insecurity Present (03/22/2023)   Hunger Vital Sign    Worried About Running Out of Food in the Last Year: Never true    Ran Out of Food in the Last Year: Sometimes true   Transportation Needs: No Transportation Needs (04/29/2023)   PRAPARE - Administrator, Civil Service (Medical): No    Lack of Transportation (Non-Medical): No  Physical Activity: Unknown (03/22/2023)   Exercise Vital Sign    Days of Exercise per Week: 0 days    Minutes of Exercise per Session: Not on file  Stress: Stress Concern Present (03/22/2023)   [employer] of Occupational Health - Occupational Stress Questionnaire    Feeling of Stress : Rather much  Social Connections: Unknown (03/22/2023)   Social Connection and Isolation Panel [NHANES]    Frequency of Communication with Friends and Family: More than three times a week    Frequency of Social Gatherings with Friends and Family: Once a week    Attends Religious Services: Patient declined    Database administrator or Organizations: No    Attends Engineer, structural: Not on file    Marital Status: Separated  Intimate Partner Violence: Not At Risk (04/29/2023)   Humiliation, Afraid, Rape, and Kick questionnaire    Fear of Current or Ex-Partner: No    Emotionally Abused: No    Physically Abused: No    Sexually Abused: No    Family History:  Family History  Problem Relation Age of Onset   Hypertension Mother    Stroke Mother    Asthma Mother    COPD Mother    Early death Mother    Diabetes Father    Heart disease Father    Early death Father    Kidney disease Father    Hyperthyroidism Sister    COPD Sister    Asthma Sister    COPD Sister    Miscarriages / Stillbirths Sister    Obesity Brother    Hypertension Brother    Asthma Brother    Anxiety disorder Brother    Asthma Brother    Obesity Brother    Diabetes Brother    Asthma Brother    Asthma Brother    Polycystic ovary syndrome Daughter    Asthma Son    Obesity Son    ADD / ADHD Son    Colon cancer Cousin    Rectal cancer Neg Hx    Stomach cancer Neg Hx     Medications:   Current Outpatient Medications on File Prior to Visit   Medication Sig Dispense Refill   acetaminophen (TYLENOL) 500 MG tablet Take 500 mg by mouth every 6 (six) hours as needed.     Cholecalciferol (VITAMIN D3) 125 MCG (5000 UT) CAPS Take 2 capsules by mouth in the morning and at bedtime.  2 in AM & 2 in PM. (10,000iu /2xday).     clonazePAM (KLONOPIN) 1 MG tablet Take 1 tablet (1 mg total) by mouth 2 (two) times daily as needed for anxiety. 60 tablet 0   DULoxetine (CYMBALTA) 60 MG capsule Take 1 capsule (60 mg total) by mouth 2 (two) times daily. 180 capsule 0   EPINEPHrine 0.3 mg/0.3 mL IJ SOAJ injection Inject 0.3 mg into the muscle as needed for anaphylaxis. 1 each 2   gabapentin (NEURONTIN) 300 MG capsule TAKE 1 CAPSULE BY MOUTH THREE TIMES A DAY 90 capsule 2   ibuprofen (ADVIL) 200 MG tablet Take 400 mg by mouth every 6 (six) hours as needed.     meloxicam (MOBIC) 15 MG tablet Take 15 mg by mouth daily.     nystatin cream (MYCOSTATIN) Apply 1 Application topically 2 (two) times daily. 15 g 1   omeprazole (PRILOSEC) 20 MG capsule Take 20 mg by mouth daily.     ondansetron (ZOFRAN) 4 MG tablet Take 1 tablet (4 mg total) by mouth daily as needed for nausea or vomiting. 30 tablet 1   progesterone (PROMETRIUM) 200 MG capsule Take 1 daily for 21 days every month 21 capsule 12   valACYclovir (VALTREX) 500 MG tablet TAKE 1 TABLET BY MOUTH TWICE A DAY 180 tablet 3   vitamin B-12 (CYANOCOBALAMIN) 500 MCG tablet Take 500 mcg by mouth daily.     albuterol (VENTOLIN HFA) 108 (90 Base) MCG/ACT inhaler Inhale 2 puffs into the lungs every 6 (six) hours as needed for wheezing or shortness of breath. (Patient not taking: Reported on 06/03/2023) 8 g 0   amphetamine-dextroamphetamine (ADDERALL XR) 20 MG 24 hr capsule Take 20 mg by mouth daily as needed. (Patient not taking: Reported on 08/11/2023)     clotrimazole (LOTRIMIN) 1 % cream Apply 1 Application topically 2 (two) times daily. (Patient not taking: Reported on 08/11/2023)     ferrous sulfate 325 (65 FE) MG  tablet Take 325 mg by mouth 2 (two) times daily with a meal. (Patient not taking: Reported on 06/03/2023)     predniSONE (DELTASONE) 20 MG tablet Take 2 tablets (40 mg total) by mouth daily with breakfast. (Patient not taking: Reported on 08/11/2023) 10 tablet 0   No current facility-administered medications on file prior to visit.    Allergies:   Allergies  Allergen Reactions   Nickel Other (See Comments)   Penicillins     Other Reaction(s): Not available   Shellfish Allergy    Sulfa Antibiotics Rash      OBJECTIVE:  Physical Exam  Vitals:   08/11/23 0909  BP: 138/72  Pulse: 82  Weight: 205 lb (93 kg)  Height: 5' (1.524 m)   Body mass index is 40.04 kg/m. No results found.   General: well developed, well nourished, very pleasant middle-age female, seated, in no evident distress Head: head normocephalic and atraumatic.   Neck: supple with no carotid or supraclavicular bruits Cardiovascular: regular rate and rhythm, no murmurs Musculoskeletal: no deformity Skin:  no rash/petichiae Vascular:  Normal pulses all extremities   Neurologic Exam Mental Status: Awake and fully alert. Oriented to place and time. Recent and remote memory intact. Attention span, concentration and fund of knowledge appropriate. Mood and affect appropriate.  Cranial Nerves: Pupils equal, briskly reactive to light. Extraocular movements full without nystagmus. Visual fields full to confrontation. Hearing intact. Facial sensation intact. Face, tongue, palate moves normally and symmetrically.  Motor: Normal bulk and tone. Normal strength in  all tested extremity muscles Gait and Station: Arises from chair without difficulty. Stance is normal. Gait demonstrates normal stride length and balance without use of AD. Tandem walk and heel toe without difficulty.  Reflexes: 1+ and symmetric. Toes downgoing.         ASSESSMENT/PLAN: Diana Giles is a 52 y.o. year old female    OSA on CPAP :  Compliance report shows low nightly usage.  Discussed ways to help improve tolerance and desensitization. May consider mask refitting or change of interface is she continues to remove mask while sleeping. Discussed importance of nightly usage with ensuring greater than 4 hours nightly for optimal benefit and per insurance purposes.  Continue to follow with DME company for any needed supplies or CPAP related concerns     Follow up in 6 months via MyChart VV or call earlier if needed   CC:  PCP: [name], NP    I spent 25 minutes of face-to-face and non-face-to-face time with patient.  This included previsit chart review, lab review, study review, order entry, electronic health record documentation, patient education and discussion regarding above diagnoses and treatment plan and answered all other questions to patient's satisfaction    Ihor Austin, [facility]  [facility] [address] [place], [place] [address]  Phone [phone] Fax [phone] Note: This document was prepared with digital dictation and possible smart phrase technology. Any transcriptional errors that result from this process are unintentional.

## 2023-08-11 NOTE — Progress Notes (Signed)
Remote pacemaker transmission.   

## 2023-08-12 ENCOUNTER — Ambulatory Visit: Payer: No Typology Code available for payment source

## 2023-08-15 ENCOUNTER — Telehealth: Payer: Self-pay

## 2023-08-17 IMAGING — MR MR LUMBAR SPINE W/O CM
4 of 5 series · 26 of 48 positions shown · non-contrast
Comparison: None.

CLINICAL DATA: Low back pain, multiple falls

EXAM:
MRI LUMBAR SPINE WITHOUT CONTRAST
TECHNIQUE: Multiplanar, multisequence MR imaging of the lumbar spine was
performed. No intravenous contrast was administered.

[Series 5: T2 · sagittal · 4.0mm · 0.73mm/px · 6 of 16 slices shown (1 of 2)]
[im 1/16]
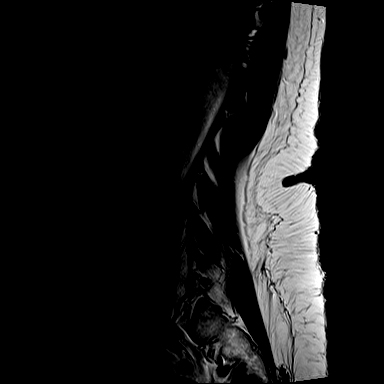
[im 4/16]
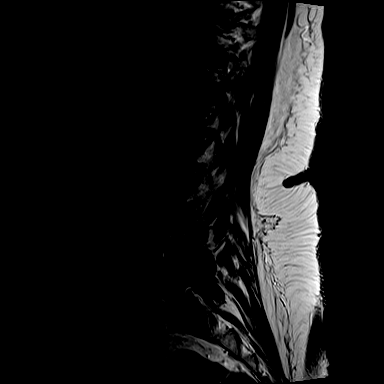
[im 7/16]
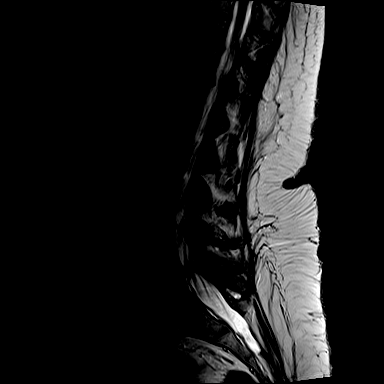
[im 10/16]
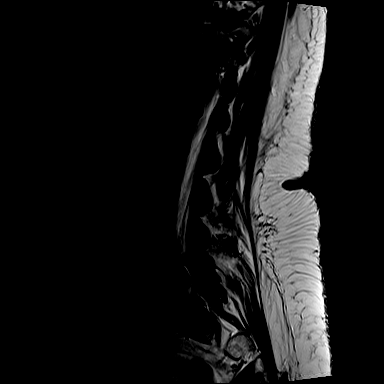
[im 13/16]
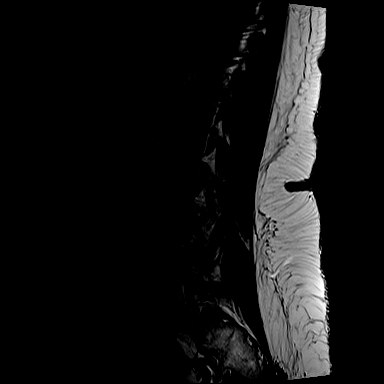
[im 16/16]
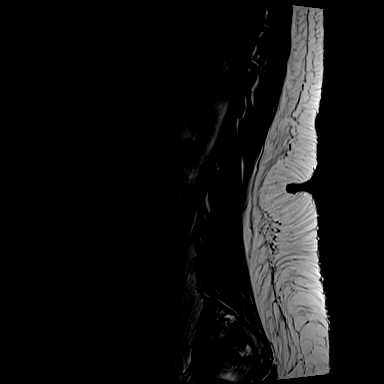

[Series 7: T1 · sagittal · 4.0mm · 0.88mm/px · 7 of 16 slices shown (1 of 2)]
[im 1/16]
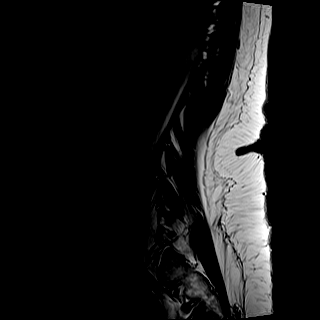
[im 3/16]
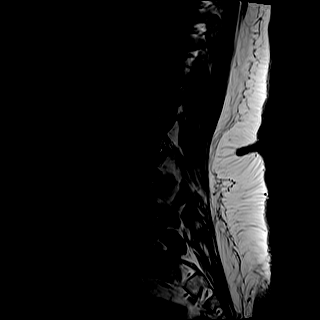
[im 6/16]
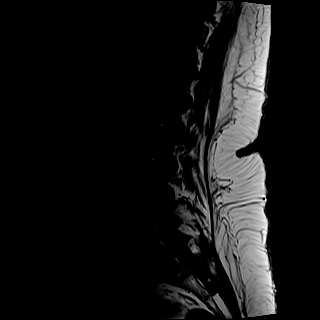
[im 8/16]
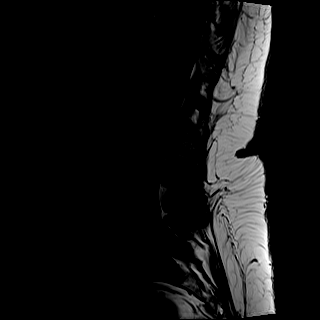
[im 11/16]
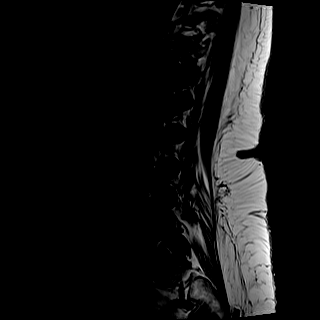
[im 13/16]
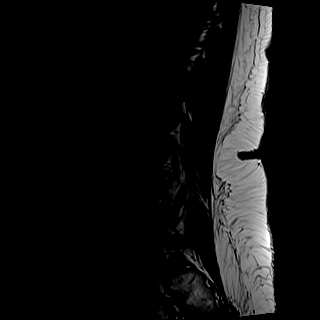
[im 16/16]
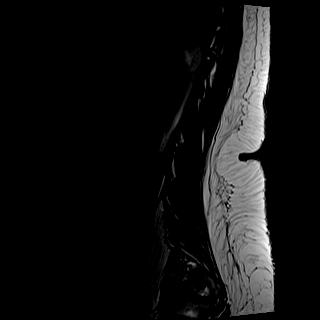

[Series 8: T2 · axial · 4.0mm · 0.57mm/px · z∈[-103,+90]mm · 8 of 31 slices shown (2 of 2)]
[im 1/31]
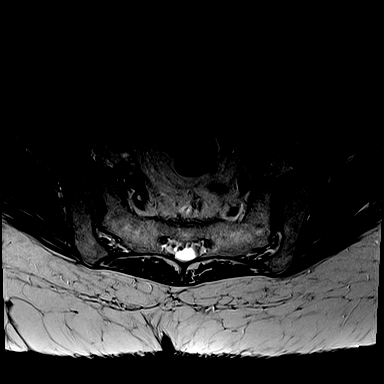
[im 5/31]
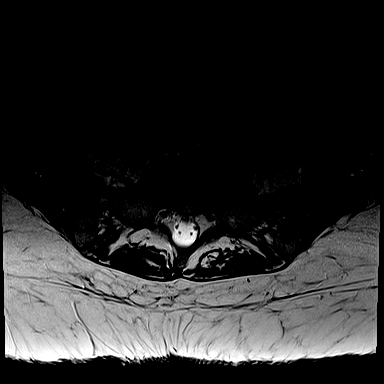
[im 10/31]
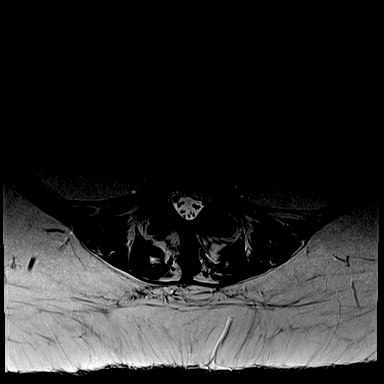
[im 14/31]
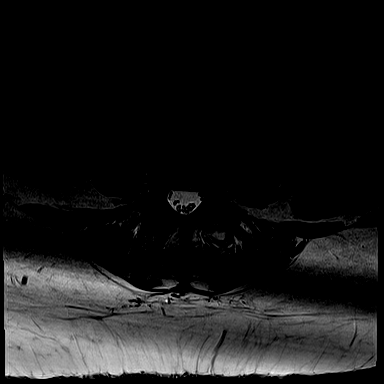
[im 17/31]
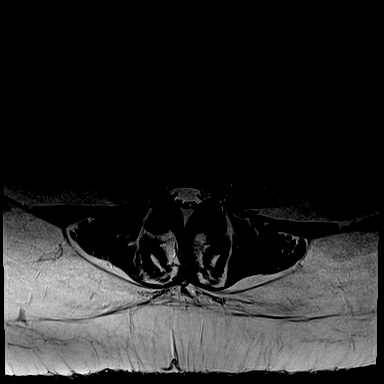
[im 21/31]
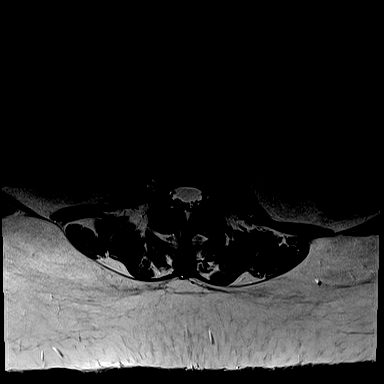
[im 26/31]
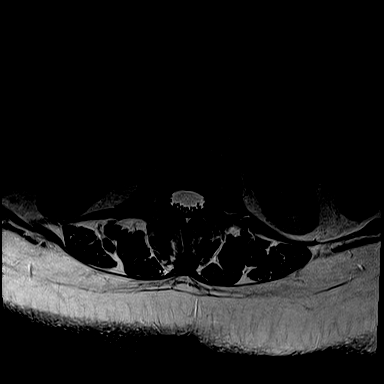
[im 31/31]
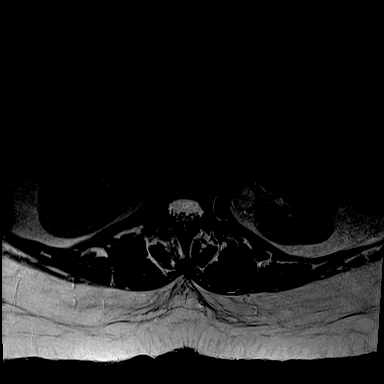

[Series 9: T1 · axial · 4.0mm · 0.34mm/px · z∈[-103,+66]mm · 5 of 31 slices shown (2 of 2)]
[im 1/31]
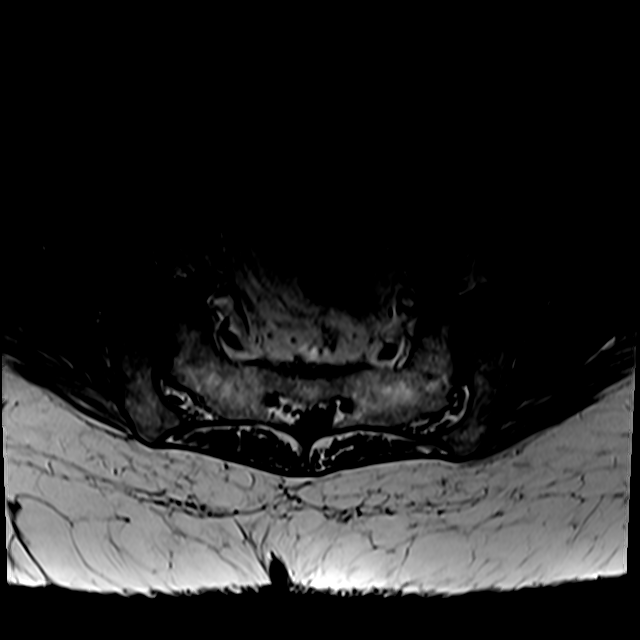
[im 5/31]
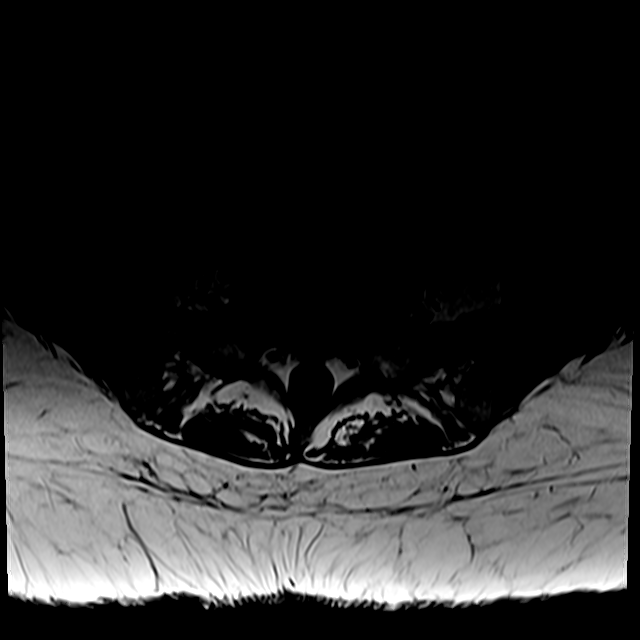
[im 10/31]
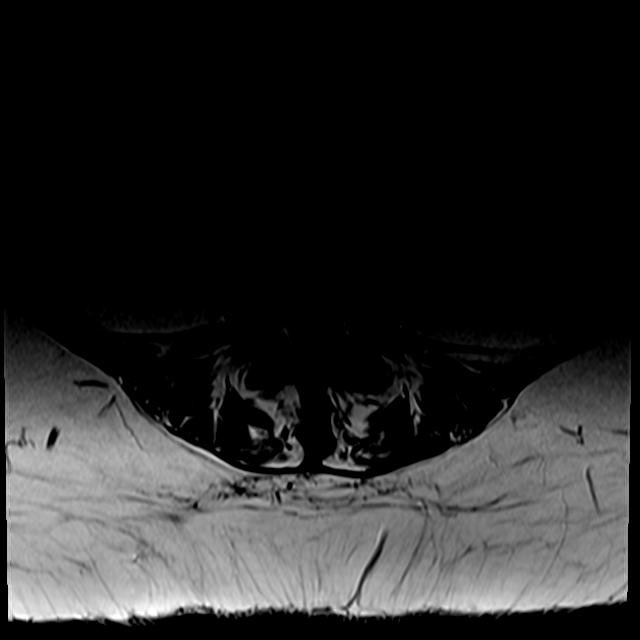
[im 17/31]
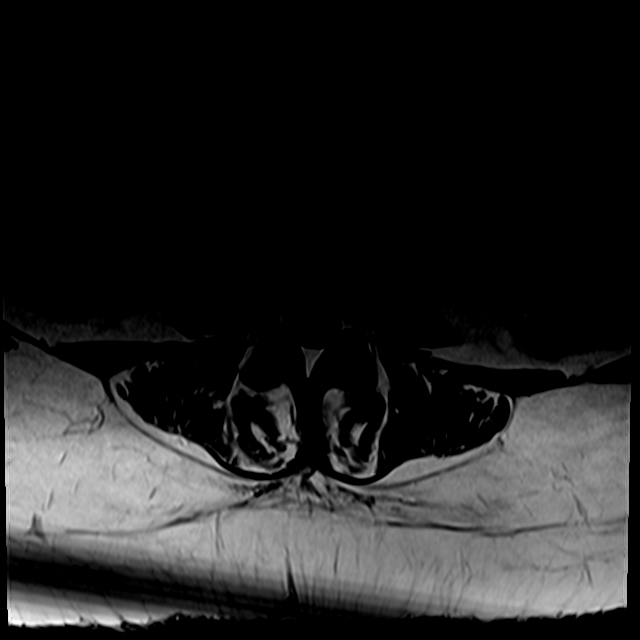
[im 26/31]
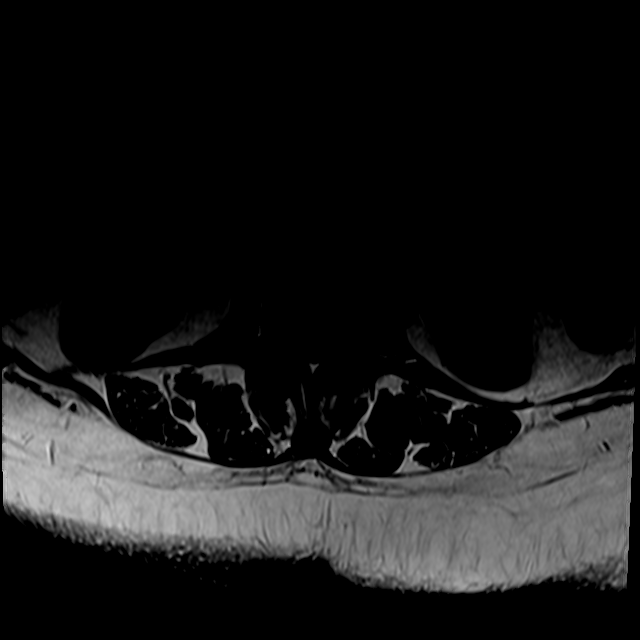

[26 of 48 positions shown; findings below may reference images not displayed]

FINDINGS: Segmentation:  Standard.

Alignment:  Mild levocurvature.  No listhesis.

Vertebrae: No acute fracture, evidence of discitis, or suspicious
bone lesion. Multilevel endplate degenerative changes.

Conus medullaris and cauda equina: Conus extends to the T12-L1
level. Conus and cauda equina appear normal.

Paraspinal and other soft tissues: Negative.

Disc levels:

T12-L1: Seen only on the sagittal images. Mild disc bulge. No spinal
canal stenosis or neural foraminal narrowing.

L1-L2: No significant disc bulge. No spinal canal stenosis or neural
foraminal narrowing.

L2-L3: No significant disc bulge. No spinal canal stenosis or neural
foraminal narrowing.

L3-L4: No significant disc bulge. No spinal canal stenosis or neural
foraminal narrowing.

L4-L5: Disc height loss with mild broad-based disc bulge. Mild facet
arthropathy. No spinal canal stenosis. Mild left neural foraminal
narrowing.

L5-S1: Disc height loss with broad-based disc bulge with
superimposed left foraminal protrusion. Mild facet arthropathy. No
spinal canal stenosis. No neural foraminal narrowing.
IMPRESSION: Mild degenerative changes in the lumbar spine, with mild left neural
foraminal narrowing at L4-L5 and no spinal canal stenosis.

## 2023-08-17 IMAGING — DX DG CHEST 1V
1 series · 1 of 1 positions shown · non-contrast
Comparison: None.

CLINICAL DATA: Pre MRI

EXAM:
CHEST  1 VIEW

[chest pa]
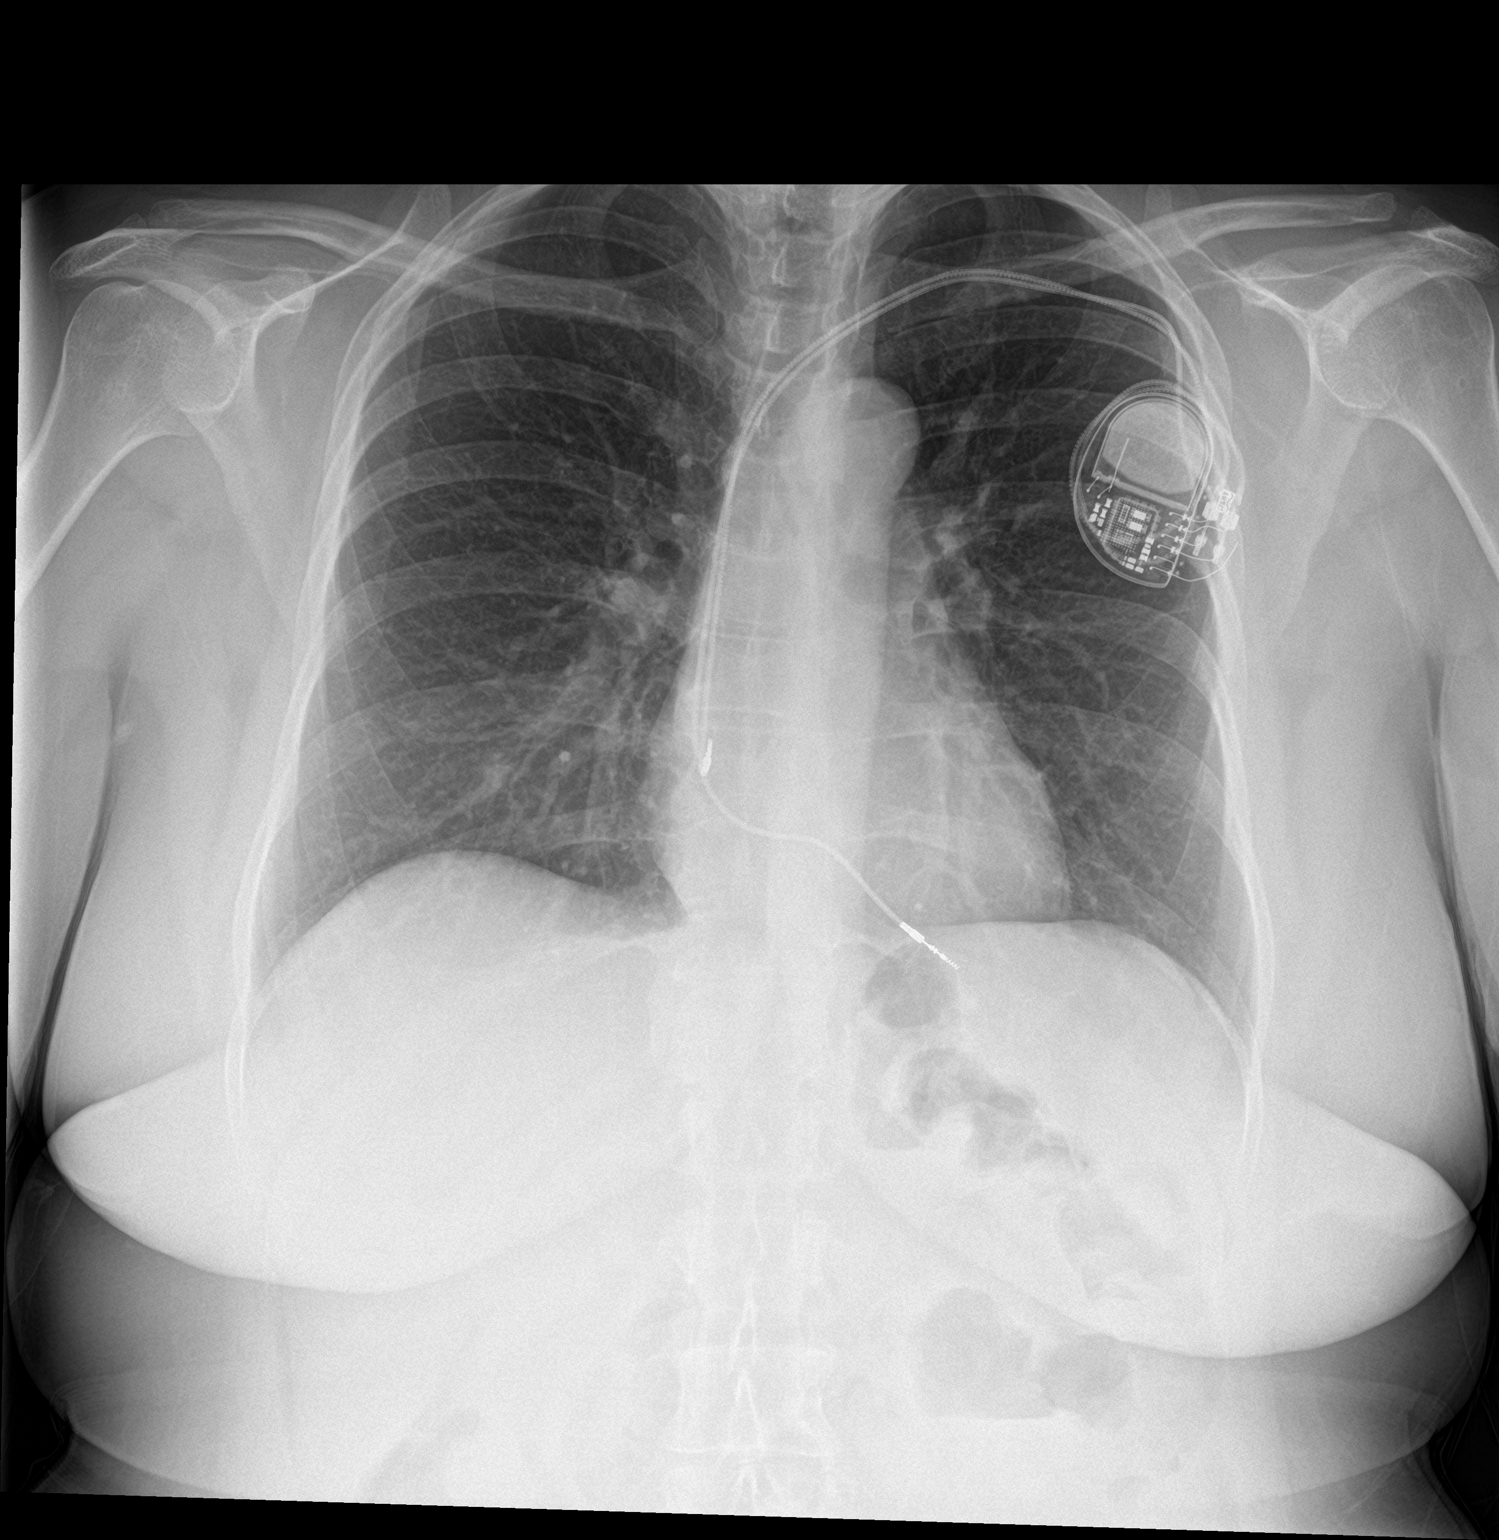

[1 of 1 positions shown; findings below may reference images not displayed]

FINDINGS: Left dual lead pacemaker with leads positioned over the expected
area of the right atrium and right ventricle. No evidence of
fractured or abandoned leads.

The heart size and mediastinal contours are within normal limits.
Both lungs are clear. The visualized skeletal structures are
unremarkable.
IMPRESSION: No evidence of fractured or abandoned pacer leads.

## 2023-08-19 ENCOUNTER — Inpatient Hospital Stay: Payer: BC Managed Care – PPO | Attending: Oncology

## 2023-08-19 DIAGNOSIS — D509 Iron deficiency anemia, unspecified: Secondary | ICD-10-CM | POA: Diagnosis present

## 2023-08-19 DIAGNOSIS — Z800 Family history of malignant neoplasm of digestive organs: Secondary | ICD-10-CM | POA: Insufficient documentation

## 2023-08-19 DIAGNOSIS — Z9884 Bariatric surgery status: Secondary | ICD-10-CM | POA: Diagnosis not present

## 2023-08-19 LAB — IRON AND TIBC
Iron: 93 ug/dL (ref 28–170)
Saturation Ratios: 27 % (ref 10.4–31.8)
TIBC: 351 ug/dL (ref 250–450)
UIBC: 258 ug/dL

## 2023-08-19 LAB — CBC (CANCER CENTER ONLY)
HCT: 40.4 % (ref 36.0–46.0)
Hemoglobin: 13.2 g/dL (ref 12.0–15.0)
MCH: 30.8 pg (ref 26.0–34.0)
MCHC: 32.7 g/dL (ref 30.0–36.0)
MCV: 94.2 fL (ref 80.0–100.0)
Platelet Count: 373 10*3/uL (ref 150–400)
RBC: 4.29 MIL/uL (ref 3.87–5.11)
RDW: 13.2 % (ref 11.5–15.5)
WBC Count: 6.5 10*3/uL (ref 4.0–10.5)
nRBC: 0 % (ref 0.0–0.2)

## 2023-08-19 LAB — FERRITIN: Ferritin: 70 ng/mL (ref 11–307)

## 2023-08-19 LAB — FOLATE: Folate: 19.7 ng/mL (ref >5.9)

## 2023-08-19 LAB — VITAMIN B12: Vitamin B-12: 252 pg/mL (ref 180–914)

## 2023-08-19 NOTE — Progress Notes (Signed)
Office Visit Note  Patient: Diana Giles             Date of Birth: [birth year]           MRN: [id number]             PCP: [name], NP Referring: [name], NP Visit Date: 09/02/2023 Occupation: @GUAROCC @  Subjective:  Discuss FMLA paperwork   History of Present Illness: Diana Giles is a 52 y.o. female with history of DDD, osteoarthritis, and fibromyalgia.  Patient continues to have chronic pain involving multiple joints.  Her pain has been severe in her lower back, both hips, and both knee joints.  She will be following up with her orthopedist for further evaluation of the right hip pain she has been experiencing.  She remains on Cymbalta as prescribed.  She has been initiated on gabapentin by Dr. Shearon Stalls.  She has noticed about a 40% improvement in her myofascial pain since initiating gabapentin.  Her symptoms have still not been adequately controlled so she is considering either increasing the dose of gabapentin or switching to Lyrica.  She tried increasing the dose of gabapentin to 600 mg but had increased brain fogginess and has resumed the prescription as prescribed.   Activities of Daily Living:  Patient reports morning stiffness for 20 minutes.   Patient Reports nocturnal pain.  Difficulty dressing/grooming: Reports Difficulty climbing stairs: Reports Difficulty getting out of chair: Reports Difficulty using hands for taps, buttons, cutlery, and/or writing: Reports  Review of Systems  Constitutional:  Positive for fatigue.  HENT:  Positive for mouth dryness. Negative for mouth sores.   Eyes:  Negative for dryness.  Respiratory:  Negative for shortness of breath.   Cardiovascular:  Negative for chest pain and palpitations.  Gastrointestinal:  Positive for constipation and diarrhea. Negative for blood in stool.  Endocrine: Negative for increased urination.  Genitourinary:  Negative for involuntary urination.  Musculoskeletal:  Positive for joint pain, gait  problem, joint pain, joint swelling, myalgias, muscle weakness, morning stiffness, muscle tenderness and myalgias.  Skin:  Positive for hair loss and sensitivity to sunlight. Negative for color change and rash.  Allergic/Immunologic: Negative for susceptible to infections.  Neurological:  Positive for headaches. Negative for dizziness.  Hematological:  Negative for swollen glands.  Psychiatric/Behavioral:  Positive for depressed mood and sleep disturbance. The patient is nervous/anxious.     PMFS History:  Patient Active Problem List   Diagnosis Date Noted   History of anaphylaxis 07/15/2023   Rash 07/15/2023   Acute pain of right shoulder 07/15/2023   Iron deficiency anemia, unspecified 04/29/2023   Anemia 04/15/2023   Abnormal TSH 04/15/2023   Generalized abdominal pain 03/25/2023   Change in nail appearance 03/25/2023   Diabetes mellitus screening 03/25/2023   Encounter for lipid screening for cardiovascular disease 03/25/2023   Encounter for hepatitis C screening test for low risk patient 03/25/2023   Encounter for screening mammogram for malignant neoplasm of breast 03/25/2023   COVID-19 11/26/2022   Chronic bilateral low back pain with right-sided sciatica 09/15/2022   Arthritis pain 09/15/2022   Fibromyalgia 09/15/2022   Chronic pain syndrome 09/15/2022   Bilateral sacroiliitis (HCC) 09/15/2022   Postmenopause 08/09/2022   Pacemaker 01/07/2021   Fatigue 11/13/2020   DDD (degenerative disc disease), lumbar 11/13/2020   Avascular necrosis of bone of hip, right (HCC) 11/13/2020   Sinus node dysfunction (HCC) 11/13/2020   Thrombocytosis 11/13/2020   Anxiety 11/13/2020   Attention deficit hyperactivity disorder (ADHD)  11/13/2020    Past Medical History:  Diagnosis Date   Allergy    Anemia    Anxiety    Avascular necrosis of hip (HCC)    Blood transfusion without reported diagnosis    Bunion of left foot    COVID-19    DDD (degenerative disc disease), lumbar     Fibromyalgia    GERD (gastroesophageal reflux disease)    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    Mental disorder    Rheumatoid arthritis (HCC)    Sinus node dysfunction (HCC)    s/p pacemaker   Sleep apnea    Thrombocytosis    Vaginal Pap smear, abnormal     Family History  Problem Relation Age of Onset   Hypertension Mother    Stroke Mother    Asthma Mother    COPD Mother    Early death Mother    Diabetes Father    Heart disease Father    Early death Father    Kidney disease Father    Hyperthyroidism Sister    COPD Sister    Asthma Sister    COPD Sister    Miscarriages / Stillbirths Sister    Obesity Brother    Hypertension Brother    Asthma Brother    Anxiety disorder Brother    Asthma Brother    Obesity Brother    Diabetes Brother    Asthma Brother    Asthma Brother    Polycystic ovary syndrome Daughter    Asthma Son    Obesity Son    ADD / ADHD Son    Colon cancer Cousin    Rectal cancer Neg Hx    Stomach cancer Neg Hx    Past Surgical History:  Procedure Laterality Date   CESAREAN SECTION  06/02/94   GASTRIC BYPASS  2010   285lbs at highest weight   HYSTEROSCOPY WITH D & C  08/22/2019   JOINT REPLACEMENT  05/2019   Revision Right knee   OTHER SURGICAL HISTORY  08/2019   Bilateral fallopian tube removal   PACEMAKER PLACEMENT  08/06/2020   REVISION TOTAL KNEE ARTHROPLASTY Right 06/01/2019   SHOULDER ARTHROSCOPY Left 2018   TUBAL LIGATION Bilateral 08/07/2019   Social History   Social History Narrative   Separated for 12 years,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   Caffiene coffee 2 cups.  Cokes 2 daily.   Immunization History  Administered Date(s) Administered   Influenza, Seasonal, Injecte, Preservative Fre 07/15/2023   Influenza,inj,Quad PF,6+ Mos 08/05/2022   Influenza-Unspecified 07/31/2020, 07/07/2021   PFIZER(Purple Top)SARS-COV-2 Vaccination 02/09/2020, 03/01/2020, 09/18/2020   Tdap  12/17/2020   Zoster Recombinant(Shingrix) 09/23/2020     Objective: Vital Signs: BP 115/81 (BP Location: Left Arm, Patient Position: Sitting, Cuff Size: Normal)   Pulse 86   Resp 15   Ht 5' (1.524 m)   Wt 206 lb (93.4 kg)   BMI 40.23 kg/m    Physical Exam Vitals and nursing note reviewed.  Constitutional:      Appearance: She is well-developed.  HENT:     Head: Normocephalic and atraumatic.  Eyes:     Conjunctiva/sclera: Conjunctivae normal.  Cardiovascular:     Rate and Rhythm: Normal rate and regular rhythm.     Heart sounds: Normal heart sounds.  Pulmonary:     Effort: Pulmonary effort is normal.     Breath sounds: Normal breath sounds.  Abdominal:  General: Bowel sounds are normal.     Palpations: Abdomen is soft.  Musculoskeletal:     Cervical back: Normal range of motion.  Lymphadenopathy:     Cervical: No cervical adenopathy.  Skin:    General: Skin is warm and dry.     Capillary Refill: Capillary refill takes less than 2 seconds.  Neurological:     Mental Status: She is alert and oriented to person, place, and time.  Psychiatric:        Behavior: Behavior normal.      Musculoskeletal Exam: Generalized hyperalgesia and positive tender points on exam.  C-spine has limited range of motion with lateral Tatian.  Trapezius muscle tension tenderness bilaterally.  Limited mobility of the lumbar spine.  Shoulder joints, elbow joints, wrist joints, MCPs, PIPs, DIPs have good range of motion with no synovitis.  Painful range of motion of both hips, right greater than left.  Painful range of motion of both knee joints.  Ankle joints have good range of motion with no tenderness or synovitis.  CDAI Exam: CDAI Score: -- Patient Global: --; Provider Global: -- Swollen: --; Tender: -- Joint Exam 09/02/2023   No joint exam has been documented for this visit   There is currently no information documented on the homunculus. Go to the Rheumatology activity and complete the  homunculus joint exam.  Investigation: No additional findings.  Imaging: CUP PACEART REMOTE DEVICE CHECK  Result Date: 08/06/2023 Scheduled remote reviewed. Normal device function.  Next remote 91 days. - CS, CVRS   Recent Labs: Lab Results  Component Value Date   WBC 6.5 08/19/2023   HGB 13.2 08/19/2023   PLT 373 08/19/2023   NA 139 04/15/2023   K 4.1 04/15/2023   CL 104 04/15/2023   CO2 28 04/15/2023   GLUCOSE 80 04/15/2023   BUN 17 04/15/2023   CREATININE 0.78 04/15/2023   BILITOT 0.4 04/15/2023   ALKPHOS 120 (H) 04/15/2023   AST 16 04/15/2023   ALT 11 04/15/2023   PROT 7.3 04/15/2023   ALBUMIN 4.1 04/15/2023   CALCIUM 8.8 04/15/2023   GFRAA 121 04/09/2021    Speciality Comments: No specialty comments available.  Procedures:  No procedures performed Allergies: Nickel, Penicillins, Shellfish allergy, and Sulfa antibiotics    Assessment / Plan:     Visit Diagnoses: Chronic left shoulder pain - XR unremarkable.  Patient underwent left shoulder arthroscopic surgery 2018.    Medial epicondylitis of elbow, right: Intermittent discomfort   Primary osteoarthritis of both hands: She experiences intermittent pain and stiffness in both hands.  No synovitis noted on examination today.  She was able to make a complete fist bilaterally.  She has noticed locking of her thumbs intermittently.  She will notify us if her symptoms persist or worsen.  Trochanteric bursitis of both hips: Persistent discomfort, right greater than left.  She has completed physical therapy.  Avascular necrosis of bone of hip, right [facility]): Patient continues to have chronic pain involving the right hip.Patient plans on following up with orthopedics to discuss the next steps in treatment.  She remains under the care of pain management.   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  Chronic pain.  Completed PT. Difficulty standing and walking for prolonged periods of time.   Primary  osteoarthritis of left knee: Chronic pain.  No warmth or effusion noted today.  Primary osteoarthritis of both feet: She is not experiencing any discomfort in her feet currently.  Good range  of motion of both ankle joints with no joint swelling.   Degeneration of intervertebral disc of lumbar region without discogenic back pain or lower extremity pain - She has had injections by Dr. Shearon Stalls.  She has been started on gabapentin which has helped to alleviate her symptoms by about 40%.  Fibromyalgia: Generalized hyperalgesia and positive tender points on exam.  Patient continues to experience frequent and severe fibromyalgia flares.  She has been under the care of pain management and has been started on gabapentin.  Her myofascial pain has improved by about 40%.  She tried increasing the dose of gabapentin to 600 mg but developed increased brain fog and has resumed taking 300 mg 3 times a day.  Patient is considering switching to Lyrica and will further discuss with her pain management specialist at her upcoming appointment. Patient would like to have FMLA paperwork revised to cover her for 1-2 occurrences per month for office visits lasting for 4 hours each occurrence.  FMLA paperwork will be updated accordingly  Other fatigue: Patient continues to have chronic fatigue.  Other medical conditions are listed as follows:  Sinus node dysfunction (HCC)  Pacemaker  History of gastric bypass  Thrombocytosis  History of anxiety  History of ADHD  Orders: No orders of the defined types were placed in this encounter.  No orders of the defined types were placed in this encounter.    Follow-Up Instructions: Return in about 6 months (around 03/02/2024) for DDD, Osteoarthritis, Fibromyalgia.   Gearldine Bienenstock, PA-C  Note - This record has been created using Dragon software.  Chart creation errors have been sought, but may not always  have been located. Such creation errors do not reflect on  the  standard of medical care.

## 2023-08-22 ENCOUNTER — Other Ambulatory Visit: Payer: No Typology Code available for payment source

## 2023-08-25 ENCOUNTER — Encounter: Payer: Self-pay | Admitting: Oncology

## 2023-08-25 ENCOUNTER — Inpatient Hospital Stay (HOSPITAL_BASED_OUTPATIENT_CLINIC_OR_DEPARTMENT_OTHER): Payer: BC Managed Care – PPO | Admitting: Oncology

## 2023-08-25 ENCOUNTER — Inpatient Hospital Stay: Payer: BC Managed Care – PPO

## 2023-08-25 VITALS — BP 87/68 | HR 85 | Temp 97.5°F | Resp 18 | Ht 60.0 in | Wt 207.9 lb

## 2023-08-25 DIAGNOSIS — D509 Iron deficiency anemia, unspecified: Secondary | ICD-10-CM | POA: Diagnosis not present

## 2023-08-25 NOTE — Progress Notes (Signed)
[facility]  [phone] [phone]) [phone]  ID: Diana Giles OB: Oct 24, 1971  MR#: [id number]  AOZ#:[id number]  Patient Care Team: [name], NP as PCP - General (Nurse Practitioner) [name], MD as PCP - Electrophysiology (Cardiology) Lanelle Bal, DO as Consulting Physician (Internal Medicine) Pollyann Savoy, MD as Consulting Physician (Rheumatology)  CHIEF COMPLAINT: Iron deficiency anemia.  INTERVAL HISTORY: Patient returns to clinic today for repeat laboratory work, further evaluation, and consideration of additional IV Venofer.  She feels improved after receiving IV iron, but continues to have chronic fatigue.  She otherwise feels well.  She has no neurologic complaints.  She denies any recent fevers or illnesses.  She has a good appetite and denies weight loss.  She has no chest pain, shortness of breath, cough, or hemoptysis.  She denies any nausea, vomiting, constipation, or diarrhea.  She has no melena or hematochezia.  She has no urinary complaints.  Patient offers no further specific complaints today.  REVIEW OF SYSTEMS:   Review of Systems  Constitutional:  Positive for malaise/fatigue. Negative for fever and weight loss.  Respiratory: Negative.  Negative for cough, hemoptysis and shortness of breath.   Cardiovascular: Negative.  Negative for chest pain and leg swelling.  Gastrointestinal: Negative.  Negative for abdominal pain, blood in stool and melena.  Genitourinary: Negative.  Negative for hematuria.  Musculoskeletal: Negative.  Negative for back pain.  Skin: Negative.  Negative for rash.  Neurological: Negative.  Negative for dizziness, focal weakness, weakness and headaches.  Psychiatric/Behavioral: Negative.  The patient is not nervous/anxious.     As per HPI. Otherwise, a complete review of systems is negative.  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    Mental disorder    Rheumatoid arthritis (HCC)    Sinus node dysfunction (HCC)    s/p pacemaker   Thrombocytosis    Vaginal Pap smear, abnormal     PAST SURGICAL HISTORY: Past Surgical History:  Procedure Laterality Date   CESAREAN SECTION  06/02/94   GASTRIC BYPASS  2010   285lbs at highest weight   HYSTEROSCOPY WITH D & C  08/22/2019   JOINT REPLACEMENT  05/2019   Revision Right knee   OTHER SURGICAL HISTORY  08/2019   Bilateral fallopian tube removal   PACEMAKER PLACEMENT  08/06/2020   REVISION TOTAL KNEE ARTHROPLASTY Right 06/01/2019   SHOULDER ARTHROSCOPY Left 2018   TUBAL LIGATION Bilateral 08/07/2019    FAMILY HISTORY: Family History  Problem Relation Age of Onset   Hypertension Mother    Stroke Mother    Asthma Mother    COPD Mother    Early death Mother    Diabetes Father    Heart disease Father    Early death Father    Kidney disease Father    Hyperthyroidism Sister    COPD Sister    Asthma Sister    COPD Sister    Miscarriages / Stillbirths Sister    Obesity Brother    Hypertension Brother    Asthma Brother    Anxiety disorder  Brother    Asthma Brother    Obesity Brother    Diabetes Brother    Asthma Brother    Asthma Brother    Polycystic ovary syndrome Daughter    Asthma Son    Obesity Son    ADD / ADHD Son    Colon cancer Cousin    Rectal cancer Neg Hx    Stomach cancer Neg Hx     ADVANCED DIRECTIVES (Y/N):  N  HEALTH MAINTENANCE: Social History   Tobacco Use   Smoking status: Never    Passive exposure: Past   Smokeless tobacco: Never  Vaping Use   Vaping status: Never Used  Substance Use Topics   Alcohol use: Not Currently    Comment: Occasionally    Drug use: Never     Colonoscopy:  PAP:  Bone density:  Lipid panel:  Allergies  Allergen Reactions   Nickel Other (See Comments)   Penicillins     Other Reaction(s): Not available   Shellfish Allergy    Sulfa Antibiotics Rash    Current Outpatient Medications  Medication Sig Dispense Refill   acetaminophen (TYLENOL) 500 MG tablet Take 500 mg by mouth every 6 (six) hours as needed.     Cholecalciferol (VITAMIN D3) 125 MCG (5000 UT) CAPS Take 2 capsules by mouth in the morning and at bedtime. 2 in AM & 2 in PM. (10,000iu /2xday).     clonazePAM (KLONOPIN) 1 MG tablet TAKE 1 TABLET BY MOUTH 2 TIMES DAILY AS NEEDED FOR ANXIETY. 60 tablet 0   DULoxetine (CYMBALTA) 60 MG capsule Take 1 capsule (60 mg total) by mouth 2 (two) times daily. 180 capsule 0   EPINEPHrine 0.3 mg/0.3 mL IJ SOAJ injection Inject 0.3 mg into the muscle as needed for anaphylaxis. 1 each 2   gabapentin (NEURONTIN) 300 MG capsule TAKE 1 CAPSULE BY MOUTH THREE TIMES A DAY 90 capsule 2   ibuprofen (ADVIL) 200 MG tablet Take 400 mg by mouth every 6 (six) hours as needed.     meloxicam (MOBIC) 15 MG tablet Take 15 mg by mouth daily.     nystatin cream (MYCOSTATIN) Apply 1 Application topically 2 (two) times daily. 15 g 1   omeprazole (PRILOSEC) 20 MG capsule Take 20 mg by mouth daily.     ondansetron (ZOFRAN) 4 MG tablet Take 1 tablet (4 mg total) by mouth daily as needed for nausea or vomiting. 30 tablet 1   progesterone (PROMETRIUM) 200 MG capsule Take 1 daily for 21 days every month 21 capsule 12   valACYclovir (VALTREX) 500 MG tablet TAKE 1 TABLET BY MOUTH TWICE A DAY 180 tablet 3   vitamin B-12 (CYANOCOBALAMIN) 500 MCG tablet Take 500 mcg by mouth daily.     amphetamine-dextroamphetamine (ADDERALL XR) 20 MG 24 hr capsule Take 20 mg by mouth daily as needed. (Patient not taking: Reported on 08/11/2023)     clotrimazole (LOTRIMIN) 1 % cream Apply 1 Application topically 2 (two) times daily. (Patient not taking:  Reported on 08/11/2023)     ferrous sulfate 325 (65 FE) MG tablet Take 325 mg by mouth 2 (two) times daily with a meal. (Patient not taking: Reported on 06/03/2023)     No current facility-administered medications for this visit.    OBJECTIVE: Vitals:   08/25/23 1505  BP: (!) 87/68  Pulse: 85  Resp: 18  Temp: (!) 97.5 F (36.4 C)  SpO2: 98%      Body mass index is 40.6 kg/m.  ECOG FS:0 - Asymptomatic   General: Well-developed, well-nourished, no acute distress. Eyes: Pink conjunctiva, anicteric sclera. HEENT: Normocephalic, moist mucous membranes. Lungs: No audible wheezing or coughing. Heart: Regular rate and rhythm. Abdomen: Soft, nontender, no obvious distention. Musculoskeletal: No edema, cyanosis, or clubbing. Neuro: Alert, answering all questions appropriately. Cranial nerves grossly intact. Skin: No rashes or petechiae noted. Psych: Normal affect.  LAB RESULTS:  Lab Results  Component Value Date   NA 139 04/15/2023   K 4.1 04/15/2023   CL 104 04/15/2023   CO2 28 04/15/2023   GLUCOSE 80 04/15/2023   BUN 17 04/15/2023   CREATININE 0.78 04/15/2023   CALCIUM 8.8 04/15/2023   PROT 7.3 04/15/2023   ALBUMIN 4.1 04/15/2023   AST 16 04/15/2023   ALT 11 04/15/2023   ALKPHOS 120 (H) 04/15/2023   BILITOT 0.4 04/15/2023   GFRNONAA >60 04/05/2023   GFRAA 121 04/09/2021    Lab Results  Component Value Date   WBC 6.5 08/19/2023   NEUTROABS 7.5 02/03/2022   HGB 13.2 08/19/2023   HCT 40.4 08/19/2023   MCV 94.2 08/19/2023   PLT 373 08/19/2023   Lab Results  Component Value Date   IRON 93 08/19/2023   TIBC 351 08/19/2023   IRONPCTSAT 27 08/19/2023   Lab Results  Component Value Date   FERRITIN 70 08/19/2023     STUDIES: CUP PACEART REMOTE DEVICE CHECK  Result Date: 08/06/2023 Scheduled remote reviewed. Normal device function.  Next remote 91 days. - CS, CVRS   ASSESSMENT: Iron deficiency anemia.  PLAN:    Iron deficiency anemia: Likely secondary to  poor absorption from history of gastric bypass.  Colonoscopy and EGD completed on June 03, 2023 did not reveal any significant pathology.  Patient's hemoglobin and iron stores are now within normal limits.  She does not require additional IV Venofer today.  She last received treatment on May 20, 2023.  No intervention is needed.  Return to clinic in 4 months with repeat laboratory work, further evaluation, and continuation of treatment if needed.    I spent a total of 20 minutes reviewing chart data, face-to-face evaluation with the patient, counseling and coordination of care as detailed above.   Patient expressed understanding and was in agreement with this plan. She also understands that She can call clinic at any time with any questions, concerns, or complaints.    Jeralyn Ruths, MD   08/25/2023 3:47 PM

## 2023-08-26 ENCOUNTER — Other Ambulatory Visit (HOSPITAL_COMMUNITY): Payer: BLUE CROSS/BLUE SHIELD | Attending: Medical Genetics

## 2023-08-30 ENCOUNTER — Other Ambulatory Visit: Payer: Self-pay | Admitting: Adult Health

## 2023-09-02 ENCOUNTER — Encounter: Payer: Self-pay | Admitting: Oncology

## 2023-09-02 ENCOUNTER — Ambulatory Visit: Payer: BLUE CROSS/BLUE SHIELD | Attending: Rheumatology | Admitting: Physician Assistant

## 2023-09-02 ENCOUNTER — Encounter: Payer: Self-pay | Admitting: Physician Assistant

## 2023-09-02 VITALS — BP 115/81 | HR 86 | Resp 15 | Ht 60.0 in | Wt 206.0 lb

## 2023-09-02 DIAGNOSIS — M797 Fibromyalgia: Secondary | ICD-10-CM

## 2023-09-02 DIAGNOSIS — M7701 Medial epicondylitis, right elbow: Secondary | ICD-10-CM | POA: Diagnosis not present

## 2023-09-02 DIAGNOSIS — Z96651 Presence of right artificial knee joint: Secondary | ICD-10-CM

## 2023-09-02 DIAGNOSIS — G8929 Other chronic pain: Secondary | ICD-10-CM

## 2023-09-02 DIAGNOSIS — M7062 Trochanteric bursitis, left hip: Secondary | ICD-10-CM

## 2023-09-02 DIAGNOSIS — Z950 Presence of cardiac pacemaker: Secondary | ICD-10-CM

## 2023-09-02 DIAGNOSIS — R5383 Other fatigue: Secondary | ICD-10-CM

## 2023-09-02 DIAGNOSIS — Z8659 Personal history of other mental and behavioral disorders: Secondary | ICD-10-CM

## 2023-09-02 DIAGNOSIS — M25512 Pain in left shoulder: Secondary | ICD-10-CM | POA: Diagnosis not present

## 2023-09-02 DIAGNOSIS — I495 Sick sinus syndrome: Secondary | ICD-10-CM

## 2023-09-02 DIAGNOSIS — M19072 Primary osteoarthritis, left ankle and foot: Secondary | ICD-10-CM

## 2023-09-02 DIAGNOSIS — M19071 Primary osteoarthritis, right ankle and foot: Secondary | ICD-10-CM

## 2023-09-02 DIAGNOSIS — M19041 Primary osteoarthritis, right hand: Secondary | ICD-10-CM

## 2023-09-02 DIAGNOSIS — M7061 Trochanteric bursitis, right hip: Secondary | ICD-10-CM | POA: Diagnosis not present

## 2023-09-02 DIAGNOSIS — M51369 Other intervertebral disc degeneration, lumbar region without mention of lumbar back pain or lower extremity pain: Secondary | ICD-10-CM

## 2023-09-02 DIAGNOSIS — Z9884 Bariatric surgery status: Secondary | ICD-10-CM

## 2023-09-02 DIAGNOSIS — M19042 Primary osteoarthritis, left hand: Secondary | ICD-10-CM

## 2023-09-02 DIAGNOSIS — M87051 Idiopathic aseptic necrosis of right femur: Secondary | ICD-10-CM

## 2023-09-02 DIAGNOSIS — M1712 Unilateral primary osteoarthritis, left knee: Secondary | ICD-10-CM

## 2023-09-02 DIAGNOSIS — D75839 Thrombocytosis, unspecified: Secondary | ICD-10-CM

## 2023-09-07 ENCOUNTER — Other Ambulatory Visit: Payer: Self-pay | Admitting: Nurse Practitioner

## 2023-09-07 ENCOUNTER — Encounter: Payer: Self-pay | Admitting: Oncology

## 2023-09-07 DIAGNOSIS — F419 Anxiety disorder, unspecified: Secondary | ICD-10-CM

## 2023-09-08 ENCOUNTER — Other Ambulatory Visit: Payer: Self-pay | Admitting: Nurse Practitioner

## 2023-09-08 DIAGNOSIS — F419 Anxiety disorder, unspecified: Secondary | ICD-10-CM

## 2023-09-09 ENCOUNTER — Encounter: Payer: Self-pay | Admitting: Nurse Practitioner

## 2023-09-10 ENCOUNTER — Other Ambulatory Visit: Payer: Self-pay | Admitting: Nurse Practitioner

## 2023-09-10 DIAGNOSIS — F419 Anxiety disorder, unspecified: Secondary | ICD-10-CM

## 2023-09-14 ENCOUNTER — Encounter
Payer: BLUE CROSS/BLUE SHIELD | Attending: Physical Medicine and Rehabilitation | Admitting: Physical Medicine and Rehabilitation

## 2023-09-14 ENCOUNTER — Encounter: Payer: Self-pay | Admitting: Oncology

## 2023-09-14 VITALS — BP 114/65 | HR 98 | Ht 60.0 in | Wt 206.0 lb

## 2023-09-14 DIAGNOSIS — M461 Sacroiliitis, not elsewhere classified: Secondary | ICD-10-CM

## 2023-09-14 DIAGNOSIS — M5441 Lumbago with sciatica, right side: Secondary | ICD-10-CM | POA: Insufficient documentation

## 2023-09-14 DIAGNOSIS — M797 Fibromyalgia: Secondary | ICD-10-CM | POA: Diagnosis present

## 2023-09-14 DIAGNOSIS — G894 Chronic pain syndrome: Secondary | ICD-10-CM | POA: Diagnosis present

## 2023-09-14 DIAGNOSIS — G8929 Other chronic pain: Secondary | ICD-10-CM

## 2023-09-14 MED ORDER — TRAMADOL HCL 50 MG PO TABS: 50.0000 mg | ORAL_TABLET | Freq: Two times a day (BID) | ORAL | 0 refills | Status: DC | PRN

## 2023-09-14 MED ORDER — PREGABALIN 50 MG PO CAPS: ORAL_CAPSULE | ORAL | 0 refills | Status: DC

## 2023-09-14 NOTE — Patient Instructions (Addendum)
Today, will be switching you from gabapentin to Lyrica.  Stop gabapentin, and start Lyrica 50 mg capsules twice daily for 1 week.  After that, increase it to 50 mg capsules 3 times daily for 1 week.  After that, increase it to 100 mg or 2 capsules twice daily.  Do not increase it further than this without following up with a physician.  If you notice any severe side effects from the medication, reduced to the last tolerable dose or stop and resume gabapentin.  Regardless, I want to hear from you through MyChart or through the clinic in 2 weeks to know how it is going.  I given you a temporary prescription of tramadol No. 60 tabs to use during severe pain flares for the next 3 months.  I will not refill this medication until her next appointment, so try to be judicious with medication use.  I am referring you back to Dr. Wynn Banker for medial branch blocks of the lower spine and possibly the SI joints.  Given benefits of aqua therapy, I recommend that you either join a gym with the pool or obtain access to one through your apartment and resume gentle range of motion and endurance exercises to help with joint offloading.  Follow-up with me in 3 months.  Today, I am also writing an accommodations letter for your work.  Let me know if you need any additional documentation for this.

## 2023-09-14 NOTE — Progress Notes (Signed)
Subjective:    Patient ID: Diana Giles, female    DOB: Aug 09, 1971, 52 y.o.   MRN: [id number]  HPI  [name] is a 52 y.o. year old female  who  has a past medical history of Allergy, Anemia, Anxiety, Avascular necrosis of hip (HCC), Blood transfusion without reported diagnosis, Bunion of left foot, COVID-19, DDD (degenerative disc disease), lumbar, Fibromyalgia, GERD (gastroesophageal reflux disease), Herpes simplex virus (HSV) infection, HPV in female, Mental disorder, Rheumatoid arthritis (HCC), Sinus node dysfunction (HCC), Sleep apnea, Thrombocytosis, and Vaginal Pap smear, abnormal.   They are presenting to PM&R clinic for follow up related to FM, OA, and DDD pain .  Plan from last visit: Chronic bilateral low back pain with right-sided sciatica -     MR LUMBAR SPINE WO CONTRAST; Future -     Ambulatory referral to Physical Therapy -     Basic metabolic panel Obtain MRI lumbar spine   Once labs are done, I will message you about increasing gabapentin   Follow up in 3 months   Fibromyalgia -     MR LUMBAR SPINE WO CONTRAST; Future -     Ambulatory referral to Physical Therapy -     Basic metabolic panel Start aquatherapy for gentle mobilization and    Chronic pain syndrome -     MR LUMBAR SPINE WO CONTRAST; Future -     Ambulatory referral to Physical Therapy No pain contract or narcotics prescribed today d/t lack of perceived benefit in the past on Tramadol. Trialling non-narcotic medications and interventions for now. Can re-address in the future if needed.   Bilateral sacroiliitis -     MR LUMBAR SPINE WO CONTRAST; Future -     Ambulatory referral to Physical Therapy -     Basic metabolic panel Had SI joint injection with Dr. Wynn Banker, without benefit   Interval Hx:  - Therapies: Got through PT as prescribed last time, with some improvement in mobility. She is compliant with HEP and does notice ongoing benefit. Aquatherapy was GREAT   - Follow ups:  rheumatology follow up 10/25: "She remains on Cymbalta as prescribed. She has been initiated on gabapentin by Dr. Shearon Stalls. She has noticed about a 40% improvement in her myofascial pain since initiating gabapentin. Her symptoms have still not been adequately controlled so she is considering either increasing the dose of gabapentin or switching to Lyrica. She tried increasing the dose of gabapentin to 600 mg but had increased brain fogginess and has resumed the prescription as prescribed. "  Was diagnosed with sleep apnea, got a home CPAP. She states it is getting better but she has issues taking it off in the middle of the night. She notices when she keeps it on that her energy level is better the next day ;she hasn't noticed a difference in her pain with it.    - Falls:none   - RJJ:OACZ   - Medications: She self-increase gabapentin to 600 mg once and felt a bit off, loopy. Was put on a steroid for shooting pain into her shoulder, which helped a lot.    - Other concerns: Having a lot of stress with her job currently; works for [employer]; just got moved to another office in [place] and she is being moved to another office in [place]. She is currently working 8:30 to 5:30 pm, with half day Wednesday and Thursday - the [place] office is 7 and to 4:30 pm M-F which the patient does not think it doable. Currently  only has FMLA for doctor's appointments.   Notes she had nerve ablations in her back when she lived in [place] with 3 months pain relief.   Pain Inventory Average Pain 7 Pain Right Now 7 My pain is sharp, burning, dull, stabbing, and aching  In the last 24 hours, has pain interfered with the following? General activity 7 Relation with others 6 Enjoyment of life 7 What TIME of day is your pain at its worst? morning  and night Sleep (in general) Fair  Pain is worse with: walking, bending, and standing Pain improves with: heat/ice and therapy/exercise Relief from Meds: 5  Family  History  Problem Relation Age of Onset   Hypertension Mother    Stroke Mother    Asthma Mother    COPD Mother    Early death Mother    Diabetes Father    Heart disease Father    Early death Father    Kidney disease Father    Hyperthyroidism Sister    COPD Sister    Asthma Sister    COPD Sister    Miscarriages / Stillbirths Sister    Obesity Brother    Hypertension Brother    Asthma Brother    Anxiety disorder Brother    Asthma Brother    Obesity Brother    Diabetes Brother    Asthma Brother    Asthma Brother    Polycystic ovary syndrome Daughter    Asthma Son    Obesity Son    ADD / ADHD Son    Colon cancer Cousin    Rectal cancer Neg Hx    Stomach cancer Neg Hx    Social History   Socioeconomic History   Marital status: Legally Separated    Spouse name: Not on file   Number of children: 2   Years of education: Not on file   Highest education level: Some college, no degree  Occupational History   Not on file  Tobacco Use   Smoking status: Never    Passive exposure: Past   Smokeless tobacco: Never  Vaping Use   Vaping status: Never Used  Substance and Sexual Activity   Alcohol use: Not Currently    Comment: Occasionally   Drug use: Never   Sexual activity: Not Currently    Birth control/protection: Post-menopausal, Surgical    Comment: tubal  Other Topics Concern   Not on file  Social History Narrative   Separated for 12 years,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   Caffiene coffee 2 cups.  Cokes 2 daily.   Social Determinants of Health   Financial Resource Strain: High Risk (03/22/2023)   Overall Financial Resource Strain (CARDIA)    Difficulty of Paying Living Expenses: Hard  Food Insecurity: No Food Insecurity (04/29/2023)   Hunger Vital Sign    Worried About Running Out of Food in the Last Year: Never true    Ran Out of Food in the Last Year: Never true  Recent Concern: Food Insecurity - Food Insecurity Present  (03/22/2023)   Hunger Vital Sign    Worried About Running Out of Food in the Last Year: Never true    Ran Out of Food in the Last Year: Sometimes true  Transportation Needs: No Transportation Needs (04/29/2023)   PRAPARE - Administrator, Civil Service (Medical): No    Lack of Transportation (Non-Medical): No  Physical Activity: Unknown (03/22/2023)   Exercise Vital Sign    Days of  Exercise per Week: 0 days    Minutes of Exercise per Session: Not on file  Stress: Stress Concern Present (03/22/2023)   [employer] of Occupational Health - Occupational Stress Questionnaire    Feeling of Stress : Rather much  Social Connections: Unknown (03/22/2023)   Social Connection and Isolation Panel [NHANES]    Frequency of Communication with Friends and Family: More than three times a week    Frequency of Social Gatherings with Friends and Family: Once a week    Attends Religious Services: Patient declined    Database administrator or Organizations: No    Attends Engineer, structural: Not on file    Marital Status: Separated   Past Surgical History:  Procedure Laterality Date   CESAREAN SECTION  06/02/94   GASTRIC BYPASS  2010   285lbs at highest weight   HYSTEROSCOPY WITH D & C  08/22/2019   JOINT REPLACEMENT  05/2019   Revision Right knee   OTHER SURGICAL HISTORY  08/2019   Bilateral fallopian tube removal   PACEMAKER PLACEMENT  08/06/2020   REVISION TOTAL KNEE ARTHROPLASTY Right 06/01/2019   SHOULDER ARTHROSCOPY Left 2018   TUBAL LIGATION Bilateral 08/07/2019   Past Surgical History:  Procedure Laterality Date   CESAREAN SECTION  06/02/94   GASTRIC BYPASS  2010   285lbs at highest weight   HYSTEROSCOPY WITH D & C  08/22/2019   JOINT REPLACEMENT  05/2019   Revision Right knee   OTHER SURGICAL HISTORY  08/2019   Bilateral fallopian tube removal   PACEMAKER PLACEMENT  08/06/2020   REVISION TOTAL KNEE ARTHROPLASTY Right 06/01/2019   SHOULDER ARTHROSCOPY Left  2018   TUBAL LIGATION Bilateral 08/07/2019   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    Mental disorder    Rheumatoid arthritis (HCC)    Sinus node dysfunction (HCC)    s/p pacemaker   Sleep apnea    Thrombocytosis    Vaginal Pap smear, abnormal    BP 114/65   Pulse 98   Ht 5' (1.524 m)   Wt 206 lb (93.4 kg)   SpO2 96%   BMI 40.23 kg/m   Opioid Risk Score:   Fall Risk Score:  `1  Depression screen [facility] 2/9     07/15/2023   10:17 AM 04/29/2023    2:07 PM 04/15/2023    3:47 PM 03/25/2023    3:49 PM 12/31/2022    2:22 PM 11/26/2022    2:46 PM 09/15/2022    1:19 PM  Depression screen PHQ 2/9  Decreased Interest 2 1 0 0 0 0 2  Down, Depressed, Hopeless 1 1 0 0 0 0 1  PHQ - 2 Score 3 2 0 0 0 0 3  Altered sleeping 1     0 3  Tired, decreased energy 2     0 3  Change in appetite 0     0 3  Feeling bad or failure about yourself  0     0 0  Trouble concentrating 1     0 1  Moving  slowly or fidgety/restless 1     0 1  Suicidal thoughts 0     0 0  PHQ-9 Score 8     0 14  Difficult doing work/chores Somewhat difficult     Not difficult at all Very difficult      Review of Systems  Musculoskeletal:  Positive for back pain and neck pain.       Bilateral shoulder pain Right knee pain Bilateral hip pain  All other systems reviewed and are negative.     Objective:   Physical Exam  PE: Constitution: Appropriate appearance for age. No apparent distress  +Obese Resp: No respiratory distress. No accessory muscle usage. on RA Cardio: Well perfused appearance. No peripheral edema. Abdomen: Nondistended. Nontender.   Psych: Appropriate mood and affect. Neuro: AAOx4. No  apparent cognitive deficits    Neurologic Exam:   Sensory exam: +Decresaed sensation in R lateral knee, extending along lateral calf. LLE intact.  Motor exam: strength 5/5 throughout bilateral upper extremities and bilateral lower extremities Coordination: Fine motor coordination was normal.     Back MSK:  + Bilateral facet loading, + TTP lumbar paraspinals - unchanged from prior exams Did not repeat SI joint maneuvers      Assessment & Plan:   Mylissa Staub is a 52 y.o. year old female  who  has a past medical history of Allergy, Anemia, Anxiety, Avascular necrosis of hip (HCC), Blood transfusion without reported diagnosis, Bunion of left foot, COVID-19, DDD (degenerative disc disease), lumbar, Fibromyalgia, GERD (gastroesophageal reflux disease), Herpes simplex virus (HSV) infection, HPV in female, Mental disorder, Rheumatoid arthritis (HCC), Sinus node dysfunction (HCC), Sleep apnea, Thrombocytosis, and Vaginal Pap smear, abnormal.   They are presenting to PM&R clinic as follow up for chronic pain from OA, FM, and DDD pain; now also with R sided lumbar radiculopathy and SI joint pain on prior exams.   Chronic pain syndrome Fibromyalgia I given you a temporary prescription of tramadol #60 tabs to use during severe pain flares for the next 3 months.  I will not refill this medication until her next appointment, so try to be judicious with medication use.  Given benefits of aqua therapy, I recommend that you either join a gym with the pool or obtain access to one through your apartment and resume gentle range of motion and endurance exercises to help with joint offloading.  Follow-up with me in 3 months.  Today, I am also writing an accommodations letter for your work.  Let me know if you need any additional documentation for this. Accomodations are as follows:  - at least to 20-minute rest breaks per 12-hour shifts where she can sit and rest, outside of her regularly scheduled lunch  break.  Chronic bilateral low back pain with right-sided sciatica Unable to get updated MRI lumbar spine d/t pacemaker; hopeful images from 2022 are adequate for procedure planning    Today, will be switching you from gabapentin to Lyrica.  Stop gabapentin, and start Lyrica 50 mg capsules twice daily for 1 week.  After that, increase it to 50 mg capsules 3 times daily for 1 week.  After that, increase it to 100 mg or 2 capsules twice daily.  Do not increase it further than this without following up with a physician.  If you notice any severe side effects from the medication, reduced to the last tolerable dose or stop and resume gabapentin.  Regardless, I want to hear from you through MyChart or through the clinic in  2 weeks to know how it is going.  She notes nerve ablations in her back when she lived in [place] with 3 months pain relief. I am referring you back to Dr. Wynn Banker for medial branch blocks of the lower spine and possibly the SI joints.  Bilateral sacroiliitis (HCC) Had SI joint injection with Dr. Wynn Banker, without benefit   Other orders -     Pregabalin; Take 1 capsule (50 mg total) by mouth 2 (two) times daily for 7 days, THEN 1 capsule (50 mg total) 3 (three) times daily for 7 days, THEN 2 capsules (100 mg total) 2 (two) times daily for 16 days.  Dispense: 100 capsule; Refill: 0 -     traMADol HCl; Take 1 tablet (50 mg total) by mouth 2 (two) times daily as needed for severe pain (pain score 7-10).  Dispense: 60 tablet; Refill: 0

## 2023-09-21 ENCOUNTER — Encounter: Payer: Self-pay | Admitting: Oncology

## 2023-09-21 NOTE — Progress Notes (Incomplete)
Subjective:    Patient ID: Diana Giles, female    DOB: 07/06/1971, 52 y.o.   MRN: [id number]  HPI  Diana Giles is a 52 y.o. year old female  who  has a past medical history of Allergy, Anemia, Anxiety, Avascular necrosis of hip (HCC), Blood transfusion without reported diagnosis, Bunion of left foot, COVID-19, DDD (degenerative disc disease), lumbar, Fibromyalgia, GERD (gastroesophageal reflux disease), Herpes simplex virus (HSV) infection, HPV in female, Mental disorder, Rheumatoid arthritis (HCC), Sinus node dysfunction (HCC), Sleep apnea, Thrombocytosis, and Vaginal Pap smear, abnormal.   They are presenting to PM&R clinic for follow up related to FM, OA, and DDD pain .  Plan from last visit: Chronic bilateral low back pain with right-sided sciatica -     MR LUMBAR SPINE WO CONTRAST; Future -     Ambulatory referral to Physical Therapy -     Basic metabolic panel Obtain MRI lumbar spine   Once labs are done, I will message you about increasing gabapentin   Follow up in 3 months   Fibromyalgia -     MR LUMBAR SPINE WO CONTRAST; Future -     Ambulatory referral to Physical Therapy -     Basic metabolic panel Start aquatherapy for gentle mobilization and    Chronic pain syndrome -     MR LUMBAR SPINE WO CONTRAST; Future -     Ambulatory referral to Physical Therapy No pain contract or narcotics prescribed today d/t lack of perceived benefit in the past on Tramadol. Trialling non-narcotic medications and interventions for now. Can re-address in the future if needed.   Bilateral sacroiliitis -     MR LUMBAR SPINE WO CONTRAST; Future -     Ambulatory referral to Physical Therapy -     Basic metabolic panel Had SI joint injection with Dr. Wynn Banker, without benefit   Interval Hx:  - Therapies: Got through PT as prescribed last time, with some improvement in mobility. She is compliant with HEP and does notice ongoing benefit. Aquatherapy was GREAT - is looking for a     - Follow ups: rheumatology follow up 10/25: "She remains on Cymbalta as prescribed. She has been initiated on gabapentin by Dr. Shearon Stalls. She has noticed about a 40% improvement in her myofascial pain since initiating gabapentin. Her symptoms have still not been adequately controlled so she is considering either increasing the dose of gabapentin or switching to Lyrica. She tried increasing the dose of gabapentin to 600 mg but had increased brain fogginess and has resumed the prescription as prescribed. "  Was diagnosed with sleep apnea, got a home CPAP. She states it is getting better but she has issues taking it off in the middle of the night. She notices when she keeps it on that her energy level is better the next day ;she hasn't noticed a difference in her pain with it.    - Falls:none   - YQM:VHQI   - Medications: She self-increase gabapentin to 600 mg once and felt a bit off, loopy. Was put on a steroid for shooting pain into her shoulder, which helped a lot.    - Other concerns: Having a lot of stress with her job currently; works for [employer]; just got moved to another office in [place] and she is being moved to another office in [place]. She is currently working 8:30 to 5:30 pm, with half day Wednesday and Thursday - the [place] office is 7 and to 4:30 pm M-F which the patient  does not think it doable. Currently only has FMLA for doctor's appointments.   Notes she had nerve ablations in her back when she lived in [place] with 3 months pain relief.   Pain Inventory Average Pain 7 Pain Right Now 7 My pain is sharp, burning, dull, stabbing, and aching  In the last 24 hours, has pain interfered with the following? General activity 7 Relation with others 6 Enjoyment of life 7 What TIME of day is your pain at its worst? morning  and night Sleep (in general) Fair  Pain is worse with: walking, bending, and standing Pain improves with: heat/ice and therapy/exercise Relief from Meds:  5  Family History  Problem Relation Age of Onset  . Hypertension Mother   . Stroke Mother   . Asthma Mother   . COPD Mother   . Early death Mother   . Diabetes Father   . Heart disease Father   . Early death Father   . Kidney disease Father   . Hyperthyroidism Sister   . COPD Sister   . Asthma Sister   . COPD Sister   . Miscarriages / Stillbirths Sister   . Obesity Brother   . Hypertension Brother   . Asthma Brother   . Anxiety disorder Brother   . Asthma Brother   . Obesity Brother   . Diabetes Brother   . Asthma Brother   . Asthma Brother   . Polycystic ovary syndrome Daughter   . Asthma Son   . Obesity Son   . ADD / ADHD Son   . Colon cancer Cousin   . Rectal cancer Neg Hx   . Stomach cancer Neg Hx    Social History   Socioeconomic History  . Marital status: Legally Separated    Spouse name: Not on file  . Number of children: 2  . Years of education: Not on file  . Highest education level: Some college, no degree  Occupational History  . Not on file  Tobacco Use  . Smoking status: Never    Passive exposure: Past  . Smokeless tobacco: Never  Vaping Use  . Vaping status: Never Used  Substance and Sexual Activity  . Alcohol use: Not Currently    Comment: Occasionally  . Drug use: Never  . Sexual activity: Not Currently    Birth control/protection: Post-menopausal, Surgical    Comment: tubal  Other Topics Concern  . 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   Caffiene coffee 2 cups.  Cokes 2 daily.   Social Determinants of Health   Financial Resource Strain: High Risk (03/22/2023)   Overall Financial Resource Strain (CARDIA)   . Difficulty of Paying Living Expenses: Hard  Food Insecurity: No Food Insecurity (04/29/2023)   Hunger Vital Sign   . Worried About Programme researcher, broadcasting/film/video in the Last Year: Never true   . Ran Out of Food in the Last Year: Never true   Recent Concern: Food Insecurity - Food Insecurity Present (03/22/2023)   Hunger Vital Sign   . Worried About Programme researcher, broadcasting/film/video in the Last Year: Never true   . Ran Out of Food in the Last Year: Sometimes true  Transportation Needs: No Transportation Needs (04/29/2023)   PRAPARE - Transportation   . Lack of Transportation (Medical): No   . Lack of Transportation (Non-Medical): No  Physical Activity: Unknown (03/22/2023)   Exercise Vital  Sign   . Days of Exercise per Week: 0 days   . Minutes of Exercise per Session: Not on file  Stress: Stress Concern Present (03/22/2023)   [employer] of Occupational Health - Occupational Stress Questionnaire   . Feeling of Stress : Rather much  Social Connections: Unknown (03/22/2023)   Social Connection and Isolation Panel [NHANES]   . Frequency of Communication with Friends and Family: More than three times a week   . Frequency of Social Gatherings with Friends and Family: Once a week   . Attends Religious Services: Patient declined   . Active Member of Clubs or Organizations: No   . Attends Banker Meetings: Not on file   . Marital Status: Separated   Past Surgical History:  Procedure Laterality Date  . CESAREAN SECTION  06/02/94  . GASTRIC BYPASS  2010   285lbs at highest weight  . HYSTEROSCOPY WITH D & C  08/22/2019  . JOINT REPLACEMENT  05/2019   Revision Right knee  . OTHER SURGICAL HISTORY  08/2019   Bilateral fallopian tube removal  . PACEMAKER PLACEMENT  08/06/2020  . REVISION TOTAL KNEE ARTHROPLASTY Right 06/01/2019  . SHOULDER ARTHROSCOPY Left 2018  . TUBAL LIGATION Bilateral 08/07/2019   Past Surgical History:  Procedure Laterality Date  . CESAREAN SECTION  06/02/94  . GASTRIC BYPASS  2010   285lbs at highest weight  . HYSTEROSCOPY WITH D & C  08/22/2019  . JOINT REPLACEMENT  05/2019   Revision Right knee  . OTHER SURGICAL HISTORY  08/2019   Bilateral fallopian tube removal  . PACEMAKER PLACEMENT   08/06/2020  . REVISION TOTAL KNEE ARTHROPLASTY Right 06/01/2019  . SHOULDER ARTHROSCOPY Left 2018  . TUBAL LIGATION Bilateral 08/07/2019   Past Medical History:  Diagnosis Date  . Allergy   . Anemia   . Anxiety   . Avascular necrosis of hip (HCC)   . Blood transfusion without reported diagnosis   . Bunion of left foot   . COVID-19   . DDD (degenerative disc disease), lumbar   . Fibromyalgia   . GERD (gastroesophageal reflux disease)   . Herpes simplex virus (HSV) infection   . HPV in female   . Mental disorder   . Rheumatoid arthritis (HCC)   . Sinus node dysfunction (HCC)    s/p pacemaker  . Sleep apnea   . Thrombocytosis   . Vaginal Pap smear, abnormal    BP 114/65   Pulse 98   Ht 5' (1.524 m)   Wt 206 lb (93.4 kg)   SpO2 96%   BMI 40.23 kg/m   Opioid Risk Score:   Fall Risk Score:  `1  Depression screen [facility] 2/9     07/15/2023   10:17 AM 04/29/2023    2:07 PM 04/15/2023    3:47 PM 03/25/2023    3:49 PM 12/31/2022    2:22 PM 11/26/2022    2:46 PM 09/15/2022    1:19 PM  Depression screen PHQ 2/9  Decreased Interest 2 1 0 0 0 0 2  Down, Depressed, Hopeless 1 1 0 0 0 0 1  PHQ - 2 Score 3 2 0 0 0 0 3  Altered sleeping 1     0 3  Tired, decreased energy 2     0 3  Change in appetite 0     0 3  Feeling bad or failure about yourself  0     0 0  Trouble concentrating 1  0 1  Moving slowly or fidgety/restless 1     0 1  Suicidal thoughts 0     0 0  PHQ-9 Score 8     0 14  Difficult doing work/chores Somewhat difficult     Not difficult at all Very difficult      Review of Systems  Musculoskeletal:  Positive for back pain and neck pain.       Bilateral shoulder pain Right knee pain Bilateral hip pain  All other systems reviewed and are negative.     Objective:   Physical Exam  PE: Constitution: Appropriate appearance for age. No apparent distress  +Obese Resp: No respiratory distress. No accessory muscle usage. on RA Cardio: Well perfused appearance. No  peripheral edema. Abdomen: Nondistended. Nontender.   Psych: Appropriate mood and affect. Neuro: AAOx4. No apparent cognitive deficits    Neurologic Exam:   DTRs: Reflexes were 2+ in bilateral achilles, patella, biceps, BR and triceps. Babinsky: flexor responses b/l.   Hoffmans: negative b/l Sensory exam: +Decresaed sensation in R lateral knee, extending along lateral calf and into plantar foot.  Motor exam: strength 5/5 throughout bilateral upper extremities and bilateral lower extremities Coordination: Fine motor coordination was normal.     Back MSK:  + Bilateral facet loading, + TTP paraspinals + Low back pain with b/l FABER      Assessment & Plan:   Hailie Sinsel is a 53 y.o. year old female  who  has a past medical history of Allergy, Anemia, Anxiety, Avascular necrosis of hip (HCC), Blood transfusion without reported diagnosis, Bunion of left foot, COVID-19, DDD (degenerative disc disease), lumbar, Fibromyalgia, GERD (gastroesophageal reflux disease), Herpes simplex virus (HSV) infection, HPV in female, Mental disorder, Rheumatoid arthritis (HCC), Sinus node dysfunction (HCC), Sleep apnea, Thrombocytosis, and Vaginal Pap smear, abnormal.   They are presenting to PM&R clinic as a new patient for treatment of *** . They were referred by *** . Based on their presentation, *** .  Chronic pain syndrome  Fibromyalgia  Chronic bilateral low back pain with right-sided sciatica  Bilateral sacroiliitis (HCC)  Other orders -     Pregabalin; Take 1 capsule (50 mg total) by mouth 2 (two) times daily for 7 days, THEN 1 capsule (50 mg total) 3 (three) times daily for 7 days, THEN 2 capsules (100 mg total) 2 (two) times daily for 16 days.  Dispense: 100 capsule; Refill: 0 -     traMADol HCl; Take 1 tablet (50 mg total) by mouth 2 (two) times daily as needed for severe pain (pain score 7-10).  Dispense: 60 tablet; Refill: 0

## 2023-10-11 ENCOUNTER — Other Ambulatory Visit: Payer: Self-pay | Admitting: Nurse Practitioner

## 2023-10-11 DIAGNOSIS — R21 Rash and other nonspecific skin eruption: Secondary | ICD-10-CM

## 2023-10-11 DIAGNOSIS — F419 Anxiety disorder, unspecified: Secondary | ICD-10-CM

## 2023-10-13 ENCOUNTER — Encounter: Payer: PRIVATE HEALTH INSURANCE | Admitting: Physical Medicine & Rehabilitation

## 2023-10-14 ENCOUNTER — Other Ambulatory Visit: Payer: Self-pay | Admitting: Nurse Practitioner

## 2023-10-14 ENCOUNTER — Other Ambulatory Visit: Payer: Self-pay | Admitting: Physical Medicine and Rehabilitation

## 2023-10-14 ENCOUNTER — Ambulatory Visit: Payer: No Typology Code available for payment source | Admitting: Nurse Practitioner

## 2023-10-14 ENCOUNTER — Encounter: Payer: Self-pay | Admitting: Nurse Practitioner

## 2023-10-14 VITALS — BP 106/68 | HR 66 | Temp 98.0°F | Ht 60.0 in | Wt 209.0 lb

## 2023-10-14 DIAGNOSIS — K5909 Other constipation: Secondary | ICD-10-CM | POA: Insufficient documentation

## 2023-10-14 DIAGNOSIS — R109 Unspecified abdominal pain: Secondary | ICD-10-CM | POA: Diagnosis not present

## 2023-10-14 LAB — POCT URINALYSIS DIPSTICK
Bilirubin, UA: NEGATIVE
Blood, UA: NEGATIVE
Glucose, UA: NEGATIVE
Ketones, UA: NEGATIVE
Leukocytes, UA: NEGATIVE
Nitrite, UA: NEGATIVE
Protein, UA: NEGATIVE
Spec Grav, UA: 1.02 (ref 1.010–1.025)
Urobilinogen, UA: 0.2 U/dL
pH, UA: 7 (ref 5.0–8.0)

## 2023-10-14 LAB — URINALYSIS WITH CULTURE, IF INDICATED
Bilirubin Urine: NEGATIVE
Hgb urine dipstick: NEGATIVE
Ketones, ur: NEGATIVE
Leukocytes,Ua: NEGATIVE
Nitrite: NEGATIVE
Specific Gravity, Urine: 1.015 (ref 1.000–1.030)
Total Protein, Urine: NEGATIVE
Urine Glucose: NEGATIVE
Urobilinogen, UA: 0.2 (ref 0.0–1.0)
pH: 7.5 (ref 5.0–8.0)

## 2023-10-14 MED ORDER — TRULANCE 3 MG PO TABS: 1.0000 | ORAL_TABLET | Freq: Every day | ORAL | 1 refills | Status: DC

## 2023-10-14 NOTE — Progress Notes (Unsigned)
Established Patient Office Visit  Subjective   Patient ID: Diana Giles, female    DOB: 10-25-1971  Age: 52 y.o. MRN: [id number]  Chief Complaint  Patient presents with   Medical Management of Chronic Issues    3 month follow up. PT notes still having tenderness in stomach and bloating. Check past mychart messages (PT believed they might have given then self a small hernia. PT has has endoscopy and colonoscopy which came back unremarkable)    Abdominal/Pelvic Pain: Chronic has undergone colonoscopy/endoscopy in 05/2023 without abnormal finding. About 1 month ago was concerned she may have strained her abdominal muscles or caused a hernia when straining with a bowel movement. Since then she had rested the area and used a back brace to help brace/splint the area and over time the discomfort has improved. She is still tender to palpation in her lower abdomen/pelvic area. She has chronic gas/bloating and constipation as well. Takes docusate sodium as needed. Reports bowel movement 2x/week. She is concerned she may have H. Pylori which she has had in the past.   {History (Optional):23778}  ROS: See HPI    Objective:     BP 106/68   Pulse 66   Temp 98 F (36.7 C) (Oral)   Ht 5' (1.524 m)   Wt 209 lb (94.8 kg)   SpO2 92%   BMI 40.82 kg/m  BP Readings from Last 3 Encounters:  10/14/23 106/68  09/14/23 114/65  09/02/23 115/81   Wt Readings from Last 3 Encounters:  10/14/23 209 lb (94.8 kg)  09/14/23 206 lb (93.4 kg)  09/02/23 206 lb (93.4 kg)      Physical Exam Vitals reviewed.  Constitutional:      General: She is not in acute distress.    Appearance: Normal appearance.  HENT:     Head: Normocephalic and atraumatic.  Cardiovascular:     Rate and Rhythm: Normal rate and regular rhythm.     Pulses: Normal pulses.     Heart sounds: Normal heart sounds.  Pulmonary:     Effort: Pulmonary effort is normal.     Breath sounds: Normal breath sounds.  Abdominal:      General: Abdomen is flat. Bowel sounds are normal.     Palpations: Abdomen is soft.     Tenderness: There is no abdominal tenderness.     Hernia: No hernia is present.  Skin:    General: Skin is warm and dry.  Neurological:     General: No focal deficit present.     Mental Status: She is alert and oriented to person, place, and time.  Psychiatric:        Mood and Affect: Mood normal.        Behavior: Behavior normal.        Judgment: Judgment normal.      No results found for any visits on 10/14/23.  {Labs (Optional):23779}  The 10-year ASCVD risk score (Arnett DK, et al., 2019) is: 0.6%    Assessment & Plan:   Problem List Items Addressed This Visit       Digestive   Chronic constipation    Chronic Trial Trulance 3 mg a mouth daily.  Prescription sent to pharmacy today. Consider low FODMAP diet as well, handout provided Follow-up in 3 months or sooner as needed.      Relevant Medications   docusate sodium (COLACE) 100 MG capsule   Plecanatide (TRULANCE) 3 MG TABS     Other   Abdominal pain -  Primary    Chronic with recent acute exacerbation 1 month ago that is improving over time. Etiology likely muscular wall strain versus hernia.  No obvious hernia noted on exam today.  No red flags noted on exam today. Point-of-care UA negative for evidence of UTI, will send urine for culture to verify. Order complete pelvic ultrasound with transvaginal to further evaluation, patient is concerned about H. pylori so we will order stool testing to rule this in or out.  Further recommendations may be made based upon these results.      Relevant Orders   H. pylori antigen, stool   Urinalysis with Culture, if indicated   Urine Culture   US PELVIC COMPLETE WITH TRANSVAGINAL   POCT urinalysis dipstick    Return in about 3 months (around 01/12/2024) for F/U with Beatrice Ziehm.    [name], NP

## 2023-10-14 NOTE — Assessment & Plan Note (Signed)
Chronic Trial Trulance 3 mg a mouth daily.  Prescription sent to pharmacy today. Consider low FODMAP diet as well, handout provided Follow-up in 3 months or sooner as needed.

## 2023-10-14 NOTE — Assessment & Plan Note (Signed)
Chronic with recent acute exacerbation 1 month ago that is improving over time. Etiology likely muscular wall strain versus hernia.  No obvious hernia noted on exam today.  No red flags noted on exam today. Point-of-care UA negative for evidence of UTI, will send urine for culture to verify. Order complete pelvic ultrasound with transvaginal to further evaluation, patient is concerned about H. pylori so we will order stool testing to rule this in or out.  Further recommendations may be made based upon these results.

## 2023-10-15 LAB — URINE CULTURE: Result:: NO GROWTH

## 2023-10-20 ENCOUNTER — Encounter: Payer: Self-pay | Admitting: Nurse Practitioner

## 2023-10-20 ENCOUNTER — Telehealth: Payer: No Typology Code available for payment source | Admitting: Nurse Practitioner

## 2023-10-20 ENCOUNTER — Ambulatory Visit
Admission: RE | Admit: 2023-10-20 | Discharge: 2023-10-20 | Disposition: A | Discharge status: Home / Self Care | Payer: No Typology Code available for payment source | Source: Ambulatory Visit | Attending: Nurse Practitioner | Admitting: Nurse Practitioner

## 2023-10-20 ENCOUNTER — Encounter: Payer: Self-pay | Admitting: Oncology

## 2023-10-20 ENCOUNTER — Telehealth: Payer: Self-pay

## 2023-10-20 DIAGNOSIS — M797 Fibromyalgia: Secondary | ICD-10-CM | POA: Diagnosis not present

## 2023-10-20 DIAGNOSIS — F419 Anxiety disorder, unspecified: Secondary | ICD-10-CM | POA: Diagnosis not present

## 2023-10-20 DIAGNOSIS — R109 Unspecified abdominal pain: Secondary | ICD-10-CM

## 2023-10-20 MED ORDER — BUSPIRONE HCL 10 MG PO TABS: 10.0000 mg | ORAL_TABLET | Freq: Two times a day (BID) | ORAL | 2 refills | Status: DC

## 2023-10-20 NOTE — Telephone Encounter (Signed)
Last visit, I prescribed her #60 tabs of tramadol to use as needed for severe flares, so this would be an appropriate time to use that if she has not already.  She can use them up to twice a day.  She also has a work Scientific laboratory technician from me, and it looks like she is seeing her PCP today for a work leave note for the next few days.  I think all of these measures are already appropriate management for her.  If she needs anything else or if she is worried about running out of medication early, she can make an appointment with me or Riley Lam.

## 2023-10-20 NOTE — Assessment & Plan Note (Signed)
Chronic, acutely worse Continue klonopin as needed and cymbalta 60mg  BID. Will add buspar 10mg  BID as well.  Patient encouraged to call office if she has adverse effects or no improvement in anxiety. F/U as scheduled in March or sooner as needed.

## 2023-10-20 NOTE — Assessment & Plan Note (Signed)
Chronic, pain currently flaring. She has reached out to her pain specialist for assistance with managing. Will provide her note to stay out of work for a couple of days as well.

## 2023-10-20 NOTE — Progress Notes (Signed)
   Established Patient Office Visit  An audio/visual tele-health visit was completed today for this patient. I connected with  Diana Giles on 10/20/23 utilizing audio/visual technology and verified that I am speaking with the correct person using two identifiers. The patient was located at their home, and I was located at the office of [facility] during the encounter. I discussed the limitations of evaluation and management by telemedicine. The patient expressed understanding and agreed to proceed.     Subjective   Patient ID: Diana Giles, female    DOB: [birth year]  Age: 52 y.o. MRN: [id number]  Chief Complaint  Patient presents with   Fibromyalgia    Patient arrives for virtual visit today. She reports increased flare in her chronic pain and anxiety over the last couple of days. 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 Her past medical history is significant for chronic low back pain, chronic pain syndrome, fibromyalgia, arthritis, DDD, and anxiety.     Review of Systems  Musculoskeletal:  Positive for back pain, joint pain and myalgias.  Psychiatric/Behavioral:  Positive for depression. Negative for suicidal ideas. The patient is nervous/anxious.       Objective:     There were no vitals taken for this visit.   Physical Exam Comprehensive physical exam not completed today as office visit was conducted remotely.  Patient appears well, in no acute distress. Does get tearful during visit. Patient was alert and oriented, and appeared to have appropriate judgment.   No results found for any visits on 10/20/23.    The 10-year ASCVD risk score (Arnett DK, et al., 2019) is: 0.6%    Assessment & Plan:   Problem List Items Addressed This Visit       Other   Anxiety - Primary   Chronic, acutely worse Continue klonopin as needed and cymbalta 60mg  BID. Will add buspar 10mg  BID as well.  Patient encouraged to call  office if she has adverse effects or no improvement in anxiety. F/U as scheduled in March or sooner as needed.       Relevant Medications   busPIRone (BUSPAR) 10 MG tablet   Fibromyalgia   Chronic, pain currently flaring. She has reached out to her pain specialist for assistance with managing. Will provide her note to stay out of work for a couple of days as well.        No follow-ups on file.    [name], NP

## 2023-10-20 NOTE — Telephone Encounter (Signed)
Patient called stating her job description has changed and she is doing twice the work. It has made her pain increase. Please advice

## 2023-10-21 DIAGNOSIS — M461 Sacroiliitis, not elsewhere classified: Secondary | ICD-10-CM

## 2023-10-21 DIAGNOSIS — M797 Fibromyalgia: Secondary | ICD-10-CM

## 2023-10-21 DIAGNOSIS — G894 Chronic pain syndrome: Secondary | ICD-10-CM

## 2023-10-27 ENCOUNTER — Encounter: Payer: PRIVATE HEALTH INSURANCE | Admitting: Physical Medicine & Rehabilitation

## 2023-11-04 ENCOUNTER — Ambulatory Visit (INDEPENDENT_AMBULATORY_CARE_PROVIDER_SITE_OTHER): Payer: No Typology Code available for payment source

## 2023-11-04 DIAGNOSIS — I495 Sick sinus syndrome: Secondary | ICD-10-CM

## 2023-11-04 MED ORDER — MELOXICAM 15 MG PO TABS: 15.0000 mg | ORAL_TABLET | Freq: Every day | ORAL | 2 refills | Status: DC

## 2023-11-05 LAB — CUP PACEART REMOTE DEVICE CHECK
Battery Remaining Longevity: 145 mo
Battery Voltage: 3.03 V
Brady Statistic AP VP Percent: 0.02 %
Brady Statistic AP VS Percent: 5.11 %
Brady Statistic AS VP Percent: 0.03 %
Brady Statistic AS VS Percent: 94.84 %
Brady Statistic RA Percent Paced: 5.14 %
Brady Statistic RV Percent Paced: 0.05 %
Date Time Interrogation Session: 20241226215545
Implantable Lead Connection Status: 753985
Implantable Lead Connection Status: 753985
Implantable Lead Implant Date: 20210928
Implantable Lead Implant Date: 20210928
Implantable Lead Location: 753859
Implantable Lead Location: 753860
Implantable Lead Model: 4076
Implantable Lead Model: 4076
Implantable Pulse Generator Implant Date: 20210928
Lead Channel Impedance Value: 304 Ohm
Lead Channel Impedance Value: 342 Ohm
Lead Channel Impedance Value: 380 Ohm
Lead Channel Impedance Value: 418 Ohm
Lead Channel Pacing Threshold Amplitude: 0.625 V
Lead Channel Pacing Threshold Amplitude: 0.75 V
Lead Channel Pacing Threshold Pulse Width: 0.4 ms
Lead Channel Pacing Threshold Pulse Width: 0.4 ms
Lead Channel Sensing Intrinsic Amplitude: 2.375 mV
Lead Channel Sensing Intrinsic Amplitude: 2.375 mV
Lead Channel Sensing Intrinsic Amplitude: 5.875 mV
Lead Channel Sensing Intrinsic Amplitude: 5.875 mV
Lead Channel Setting Pacing Amplitude: 1.5 V
Lead Channel Setting Pacing Amplitude: 2 V
Lead Channel Setting Pacing Pulse Width: 0.4 ms
Lead Channel Setting Sensing Sensitivity: 0.9 mV
Zone Setting Status: 755011

## 2023-11-07 ENCOUNTER — Encounter: Payer: Self-pay | Admitting: Oncology

## 2023-11-12 ENCOUNTER — Other Ambulatory Visit: Payer: Self-pay | Admitting: Nurse Practitioner

## 2023-11-12 DIAGNOSIS — F419 Anxiety disorder, unspecified: Secondary | ICD-10-CM

## 2023-11-16 ENCOUNTER — Other Ambulatory Visit: Payer: Self-pay | Admitting: Physical Medicine and Rehabilitation

## 2023-11-16 ENCOUNTER — Other Ambulatory Visit: Payer: Self-pay | Admitting: Internal Medicine

## 2023-11-16 DIAGNOSIS — R21 Rash and other nonspecific skin eruption: Secondary | ICD-10-CM

## 2023-11-16 DIAGNOSIS — F419 Anxiety disorder, unspecified: Secondary | ICD-10-CM

## 2023-11-18 ENCOUNTER — Encounter: Payer: Self-pay | Admitting: Physical Medicine & Rehabilitation

## 2023-11-18 ENCOUNTER — Encounter
Payer: PRIVATE HEALTH INSURANCE | Attending: Physical Medicine and Rehabilitation | Admitting: Physical Medicine & Rehabilitation

## 2023-11-18 VITALS — BP 124/83 | HR 83 | Ht 60.0 in | Wt 209.0 lb

## 2023-11-18 DIAGNOSIS — M47817 Spondylosis without myelopathy or radiculopathy, lumbosacral region: Secondary | ICD-10-CM | POA: Insufficient documentation

## 2023-11-18 NOTE — Progress Notes (Signed)
 Subjective:    Patient ID: Diana Giles, female    DOB: [birth year], 53 y.o.   MRN: [id number]  HPI 53 year old female referred by Dr. Emeline to evaluate interventional pain options for chronic bilateral low back pain with right-sided sciatica.  She was last seen by physical medicine rehab on 09/14/2023.  An MRI of the lumbar spine was ordered.  Patient was sent to physical therapy Previous injections included sacroiliac injection performed bilaterally on 12/31/2022 ~50% pain relief for 1-2 wks The patient reportedly had nerve ablations when she lived in [place]  and Dr. Emeline requests bilateral medial branch blocks. Pt moved from [place] to [place] ~3 yrs ago   Pt with R>L sided low back pain  Reviewed most recent MRI from 2022 as below.  Also looked at actual images.  The facet arthropathy looks more in the moderate range rather than mild at L4-5 L5-S1.  Otherwise agree with interpretation. Patient was to have a repeat MRI however there was some question about MRI and pacemaker compared pad ability.  Apparently she already had her pacemaker with her last MRI at [facility] imaging. RIght TKR revision as well as hx of avascular necrosis of the right hip. Denies any groin pain  Still works FT at [employer], mostly standing   MRI LUMBAR SPINE WITHOUT CONTRAST   TECHNIQUE: Multiplanar, multisequence MR imaging of the lumbar spine was performed. No intravenous contrast was administered.   COMPARISON:  None.   FINDINGS: Segmentation:  Standard.   Alignment:  Mild levocurvature.  No listhesis.   Vertebrae: No acute fracture, evidence of discitis, or suspicious bone lesion. Multilevel endplate degenerative changes.   Conus medullaris and cauda equina: Conus extends to the T12-L1 level. Conus and cauda equina appear normal.   Paraspinal and other soft tissues: Negative.   Disc levels:   T12-L1: Seen only on the sagittal images. Mild disc bulge. No spinal canal stenosis or  neural foraminal narrowing.   L1-L2: No significant disc bulge. No spinal canal stenosis or neural foraminal narrowing.   L2-L3: No significant disc bulge. No spinal canal stenosis or neural foraminal narrowing.   L3-L4: No significant disc bulge. No spinal canal stenosis or neural foraminal narrowing.   L4-L5: Disc height loss with mild broad-based disc bulge. Mild facet arthropathy. No spinal canal stenosis. Mild left neural foraminal narrowing.   L5-S1: Disc height loss with broad-based disc bulge with superimposed left foraminal protrusion. Mild facet arthropathy. No spinal canal stenosis. No neural foraminal narrowing.   IMPRESSION: Mild degenerative changes in the lumbar spine, with mild left neural foraminal narrowing at L4-L5 and no spinal canal stenosis.     Electronically Signed   By: Donald Campion M.D.   On: 07/25/2021 03:19   Pain Inventory Average Pain 7 Pain Right Now 8 My pain is constant, dull, and aching  In the last 24 hours, has pain interfered with the following? General activity 10 Relation with others 10 Enjoyment of life 10 What TIME of day is your pain at its worst? morning , daytime, evening, and night Sleep (in general) Fair  Pain is worse with: walking, sitting, and standing Pain improves with: rest, injections, and heat Relief from Meds: 7  Family History  Problem Relation Age of Onset   Hypertension Mother    Stroke Mother    Asthma Mother    COPD Mother    Early death Mother    Diabetes Father    Heart disease Father    Early death Father  Kidney disease Father    Hyperthyroidism Sister    COPD Sister    Asthma Sister    COPD Sister    Miscarriages / Stillbirths Sister    Obesity Brother    Hypertension Brother    Asthma Brother    Anxiety disorder Brother    Asthma Brother    Obesity Brother    Diabetes Brother    Asthma Brother    Asthma Brother    Polycystic ovary syndrome Daughter    Asthma Son    Obesity Son     ADD / ADHD Son    Colon cancer Cousin    Rectal cancer Neg Hx    Stomach cancer Neg Hx    Social History   Socioeconomic History   Marital status: Legally Separated    Spouse name: Not on file   Number of children: 2   Years of education: Not on file   Highest education level: Associate degree: occupational, scientist, product/process development, or vocational program  Occupational History   Not on file  Tobacco Use   Smoking status: Never    Passive exposure: Past   Smokeless tobacco: Never  Vaping Use   Vaping status: Never Used  Substance and Sexual Activity   Alcohol use: Not Currently    Comment: Occasionally   Drug use: Never   Sexual activity: Not Currently    Birth control/protection: Post-menopausal, Surgical    Comment: tubal  Other Topics Concern   Not on file  Social History Narrative   Separated for 12 years,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   Work for [employer].   Caffiene coffee 2 cups.  Cokes 2 daily.   Social Drivers of Health   Financial Resource Strain: High Risk (10/13/2023)   Overall Financial Resource Strain (CARDIA)    Difficulty of Paying Living Expenses: Hard  Food Insecurity: Food Insecurity Present (10/13/2023)   Hunger Vital Sign    Worried About Running Out of Food in the Last Year: Sometimes true    Ran Out of Food in the Last Year: Sometimes true  Transportation Needs: No Transportation Needs (10/13/2023)   PRAPARE - Administrator, Civil Service (Medical): No    Lack of Transportation (Non-Medical): No  Physical Activity: Insufficiently Active (10/13/2023)   Exercise Vital Sign    Days of Exercise per Week: 1 day    Minutes of Exercise per Session: 20 min  Stress: Stress Concern Present (10/13/2023)   [employer] of Occupational Health - Occupational Stress Questionnaire    Feeling of Stress : Very much  Social Connections: Socially Isolated (10/13/2023)   Social Connection and Isolation Panel [NHANES]    Frequency of  Communication with Friends and Family: Once a week    Frequency of Social Gatherings with Friends and Family: Once a week    Attends Religious Services: 1 to 4 times per year    Active Member of [employer] or Organizations: No    Attends Engineer, Structural: Not on file    Marital Status: Separated   Past Surgical History:  Procedure Laterality Date   CESAREAN SECTION  06/02/94   GASTRIC BYPASS  2010   285lbs at highest weight   HYSTEROSCOPY WITH D & C  08/22/2019   JOINT REPLACEMENT  05/2019   Revision Right knee   OTHER SURGICAL HISTORY  08/2019   Bilateral fallopian tube removal   PACEMAKER PLACEMENT  08/06/2020   REVISION TOTAL KNEE ARTHROPLASTY Right 06/01/2019   SHOULDER ARTHROSCOPY  Left 2018   TUBAL LIGATION Bilateral 08/07/2019   Past Surgical History:  Procedure Laterality Date   CESAREAN SECTION  06/02/94   GASTRIC BYPASS  2010   285lbs at highest weight   HYSTEROSCOPY WITH D & C  08/22/2019   JOINT REPLACEMENT  05/2019   Revision Right knee   OTHER SURGICAL HISTORY  08/2019   Bilateral fallopian tube removal   PACEMAKER PLACEMENT  08/06/2020   REVISION TOTAL KNEE ARTHROPLASTY Right 06/01/2019   SHOULDER ARTHROSCOPY Left 2018   TUBAL LIGATION Bilateral 08/07/2019   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    Mental disorder    Rheumatoid arthritis (HCC)    Sinus node dysfunction (HCC)    s/p pacemaker   Sleep apnea    Thrombocytosis    Vaginal Pap smear, abnormal    BP 124/83   Pulse 83   Ht 5' (1.524 m)   Wt 209 lb (94.8 kg)   SpO2 96%   BMI 40.82 kg/m   Opioid Risk Score:   Fall Risk Score:  `1  Depression screen PHQ 2/9      11/18/2023    1:50 PM 10/14/2023   10:43 AM 07/15/2023   10:17 AM 04/29/2023    2:07 PM 04/15/2023    3:47 PM 03/25/2023    3:49 PM 12/31/2022    2:22 PM  Depression screen PHQ 2/9  Decreased Interest 1 0 2 1 0 0 0  Down, Depressed, Hopeless 1 1 1 1  0 0 0  PHQ - 2 Score 2 1 3 2  0 0 0  Altered sleeping   1      Tired, decreased energy   2      Change in appetite   0      Feeling bad or failure about yourself    0      Trouble concentrating   1      Moving slowly or fidgety/restless   1      Suicidal thoughts   0      PHQ-9 Score   8      Difficult doing work/chores   Somewhat difficult        Review of Systems  Musculoskeletal:  Positive for back pain.       Right hip, right pain  All other systems reviewed and are negative.      Objective:   Physical Exam General No acute distress Mood and affect appropriate Motor strength is 5/5 bilateral hip flexor knee extensor 4/5 right and 5/5 left ankle dorsiflexor Negative straight leg raise bilaterally Sensation equal to light touch bilateral lower extremities Ambulates without assistive device no evidence toe drag or knee instability Lumbar spine has 50% range with flexion 25 to 50% with extension as well as lateral bending.  Lateral bending causes pain in the lumbar area. She has tenderness palpation right greater than left lumbar paraspinal area L4 and L5 as well as S1. There is no pain with hip internal and external rotation bilaterally Sacroiliac provocative tests Sacral thrust (prone) : Positive right greater than left Lateral  compression: Negative FABER's: Positive right lumbar Distraction (supine): Negative Thigh thrust test: Negative     Assessment & Plan:   1.  Lumbar pain with evidence of lumbar spondylosis on imaging studies.  More symptomatic right side L4-5 L5-S1 levels.  Pain is moderate to severe with standing.  This does impact her work activities.  Pain has not responded to conservative care including aquatic  therapy which she recently tried as well as medication management with nonprescription as well as prescription medications currently on nonsteroidal anti-inflammatory, Mobic  as well as tramadol  as well as Lyrica . Will schedule for right-sided L3-L4-L5 medial branch blocks to block pain from the L4-5 and L5-S1 facet joint complexes on the right side

## 2023-11-29 ENCOUNTER — Ambulatory Visit: Admission: EM | Admit: 2023-11-29 | Payer: PRIVATE HEALTH INSURANCE | Source: Home / Self Care

## 2023-12-02 ENCOUNTER — Telehealth: Payer: Self-pay

## 2023-12-02 NOTE — Telephone Encounter (Signed)
Patient contacted the office and inquired if Sue Lush had received her [employer] paperwork. Patient states the paperwork was faxed a couple of weeks ago and has not been sent back yet. Patient states she wants to make sure the paperwork was received at our office. Patient states she would like a call back.

## 2023-12-05 NOTE — Telephone Encounter (Signed)
Attempted to contact the patient and left message to advise patient the last set of FMLA paperwork we received was at the end of October 2024. Advised patient we completed it and faxed it. Advised patient we have not received any other paperwork. Advised patient to call the office if she has any further questions or concerns.

## 2023-12-09 NOTE — Progress Notes (Signed)
 Remote pacemaker transmission.

## 2023-12-09 NOTE — Addendum Note (Signed)
Addended by: Elease Etienne A on: 12/09/2023 09:22 AM   Modules accepted: Orders

## 2023-12-10 ENCOUNTER — Other Ambulatory Visit: Payer: Self-pay | Admitting: Nurse Practitioner

## 2023-12-10 DIAGNOSIS — F419 Anxiety disorder, unspecified: Secondary | ICD-10-CM

## 2023-12-14 ENCOUNTER — Encounter: Payer: PRIVATE HEALTH INSURANCE | Admitting: Physical Medicine and Rehabilitation

## 2023-12-16 ENCOUNTER — Telehealth: Payer: Self-pay | Admitting: *Deleted

## 2023-12-16 NOTE — Telephone Encounter (Signed)
 Attempted to contact the patient and left message to advise patient her FMLA paperwork has been completed and faxed. Advised patient we have received confirmation. Copy at front desk for her to pick up.

## 2023-12-22 ENCOUNTER — Encounter: Payer: PRIVATE HEALTH INSURANCE | Admitting: Physical Medicine & Rehabilitation

## 2023-12-23 ENCOUNTER — Other Ambulatory Visit: Payer: Self-pay | Admitting: *Deleted

## 2023-12-23 DIAGNOSIS — D509 Iron deficiency anemia, unspecified: Secondary | ICD-10-CM

## 2023-12-26 ENCOUNTER — Inpatient Hospital Stay: Payer: PRIVATE HEALTH INSURANCE

## 2023-12-26 ENCOUNTER — Other Ambulatory Visit: Payer: No Typology Code available for payment source

## 2023-12-27 ENCOUNTER — Ambulatory Visit: Payer: No Typology Code available for payment source | Admitting: Oncology

## 2023-12-27 ENCOUNTER — Ambulatory Visit: Payer: No Typology Code available for payment source

## 2024-01-02 ENCOUNTER — Inpatient Hospital Stay: Payer: PRIVATE HEALTH INSURANCE

## 2024-01-02 ENCOUNTER — Encounter: Payer: Self-pay | Admitting: Oncology

## 2024-01-02 ENCOUNTER — Inpatient Hospital Stay: Payer: PRIVATE HEALTH INSURANCE | Attending: Oncology | Admitting: Oncology

## 2024-01-03 NOTE — Progress Notes (Deleted)
 Subjective:    Patient ID: Diana Giles, female    DOB: September 23, 1971, 53 y.o.   MRN: [id number]  HPI   Pain Inventory Average Pain {NUMBERS; 0-10:5044} Pain Right Now {NUMBERS; 0-10:5044} My pain is {PAIN DESCRIPTION:21022940}  In the last 24 hours, has pain interfered with the following? General activity {NUMBERS; 0-10:5044} Relation with others {NUMBERS; 0-10:5044} Enjoyment of life {NUMBERS; 0-10:5044} What TIME of day is your pain at its worst? {time of day:24191} Sleep (in general) {BHH GOOD/FAIR/POOR:22877}  Pain is worse with: {ACTIVITIES:21022942} Pain improves with: {PAIN IMPROVES WGNF:62130865} Relief from Meds: {NUMBERS; 0-10:5044}  Family History  Problem Relation Age of Onset   Hypertension Mother    Stroke Mother    Asthma Mother    COPD Mother    Early death Mother    Diabetes Father    Heart disease Father    Early death Father    Kidney disease Father    Hyperthyroidism Sister    COPD Sister    Asthma Sister    COPD Sister    Miscarriages / Stillbirths Sister    Obesity Brother    Hypertension Brother    Asthma Brother    Anxiety disorder Brother    Asthma Brother    Obesity Brother    Diabetes Brother    Asthma Brother    Asthma Brother    Polycystic ovary syndrome Daughter    Asthma Son    Obesity Son    ADD / ADHD Son    Colon cancer Cousin    Rectal cancer Neg Hx    Stomach cancer Neg Hx    Social History   Socioeconomic History   Marital status: Legally Separated    Spouse name: Not on file   Number of children: 2   Years of education: Not on file   Highest education level: Associate degree: occupational, Scientist, product/process development, or vocational program  Occupational History   Not on file  Tobacco Use   Smoking status: Never    Passive exposure: Past   Smokeless tobacco: Never  Vaping Use   Vaping status: Never Used  Substance and Sexual Activity   Alcohol use: Not Currently    Comment: Occasionally   Drug use: Never   Sexual  activity: Not Currently    Birth control/protection: Post-menopausal, Surgical    Comment: tubal  Other Topics Concern   Not on file  Social History Narrative   Separated for 12 years,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   Caffiene coffee 2 cups.  Cokes 2 daily.   Social Drivers of Health   Financial Resource Strain: High Risk (10/13/2023)   Overall Financial Resource Strain (CARDIA)    Difficulty of Paying Living Expenses: Hard  Food Insecurity: Food Insecurity Present (10/13/2023)   Hunger Vital Sign    Worried About Running Out of Food in the Last Year: Sometimes true    Ran Out of Food in the Last Year: Sometimes true  Transportation Needs: No Transportation Needs (10/13/2023)   PRAPARE - Administrator, Civil Service (Medical): No    Lack of Transportation (Non-Medical): No  Physical Activity: Insufficiently Active (10/13/2023)   Exercise Vital Sign    Days of Exercise per Week: 1 day    Minutes of Exercise per Session: 20 min  Stress: Stress Concern Present (10/13/2023)   [employer] of Occupational Health - Occupational Stress Questionnaire    Feeling of Stress : Very much  Social Connections:  Socially Isolated (10/13/2023)   Social Connection and Isolation Panel [NHANES]    Frequency of Communication with Friends and Family: Once a week    Frequency of Social Gatherings with Friends and Family: Once a week    Attends Religious Services: 1 to 4 times per year    Active Member of [employer] or Organizations: No    Attends Engineer, structural: Not on file    Marital Status: Separated   Past Surgical History:  Procedure Laterality Date   CESAREAN SECTION  06/02/94   GASTRIC BYPASS  2010   285lbs at highest weight   HYSTEROSCOPY WITH D & C  08/22/2019   JOINT REPLACEMENT  05/2019   Revision Right knee   OTHER SURGICAL HISTORY  08/2019   Bilateral fallopian tube removal   PACEMAKER PLACEMENT  08/06/2020   REVISION  TOTAL KNEE ARTHROPLASTY Right 06/01/2019   SHOULDER ARTHROSCOPY Left 2018   TUBAL LIGATION Bilateral 08/07/2019   Past Surgical History:  Procedure Laterality Date   CESAREAN SECTION  06/02/94   GASTRIC BYPASS  2010   285lbs at highest weight   HYSTEROSCOPY WITH D & C  08/22/2019   JOINT REPLACEMENT  05/2019   Revision Right knee   OTHER SURGICAL HISTORY  08/2019   Bilateral fallopian tube removal   PACEMAKER PLACEMENT  08/06/2020   REVISION TOTAL KNEE ARTHROPLASTY Right 06/01/2019   SHOULDER ARTHROSCOPY Left 2018   TUBAL LIGATION Bilateral 08/07/2019   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  Opioid Risk Score:   Fall Risk Score:  `1  Depression screen PHQ 2/9     11/18/2023    1:50 PM 10/14/2023   10:43 AM 07/15/2023   10:17 AM 04/29/2023    2:07 PM 04/15/2023    3:47 PM 03/25/2023    3:49 PM 12/31/2022    2:22 PM  Depression screen PHQ 2/9  Decreased Interest 1 0 2 1 0 0 0  Down, Depressed, Hopeless 1 1 1 1  0 0 0  PHQ - 2 Score 2 1 3 2  0 0 0  Altered sleeping   1      Tired, decreased energy   2      Change in appetite   0      Feeling bad or failure about yourself    0      Trouble concentrating   1      Moving slowly or fidgety/restless   1      Suicidal thoughts   0      PHQ-9 Score   8      Difficult doing work/chores   Somewhat difficult        Review of Systems     Objective:   Physical Exam        Assessment &  Plan:

## 2024-01-04 ENCOUNTER — Encounter: Payer: PRIVATE HEALTH INSURANCE | Admitting: Physical Medicine and Rehabilitation

## 2024-01-12 ENCOUNTER — Ambulatory Visit: Payer: No Typology Code available for payment source | Admitting: Nurse Practitioner

## 2024-01-13 ENCOUNTER — Other Ambulatory Visit: Payer: Self-pay | Admitting: Nurse Practitioner

## 2024-01-13 DIAGNOSIS — F419 Anxiety disorder, unspecified: Secondary | ICD-10-CM

## 2024-01-16 NOTE — Progress Notes (Unsigned)
  PROCEDURE RECORD [facility]   Name: Diana Giles DOB:1970-11-12 MRN: [id number]  Date:01/16/2024  Physician: Claudette Laws, MD    Nurse/CMA: Charise Carwin MA  Allergies:  Allergies  Allergen Reactions   Nickel Other (See Comments)    Other Reaction(s): Not available   Penicillins     Other Reaction(s): Not available   Shellfish Allergy    Sulfa Antibiotics Rash    Consent Signed: {yes WU:981191}  Is patient diabetic? {yes no:314532}  CBG today? ***  Pregnant: {yes no:314532} LMP: No LMP recorded. Patient is postmenopausal. (age 36-55)  Anticoagulants: {Yes/No:19989} Anti-inflammatory: {Yes/No:19989} Antibiotics: {Yes/No:19989}  Procedure: Right L3-4-5 Medial Branch Block  Position: Prone Start Time: ***  End Time: ***  Fluoro Time: ***  RN/CMA Roby Spalla MA Coraleigh Sheeran MA    Time      BP      Pulse      Respirations      O2 Sat      S/S      Pain Level       D/C home with ***, patient A & O X 3, D/C instructions reviewed, and sits independently.         Subjective:    Patient ID: Diana Giles, female    DOB: 1971/05/21, 53 y.o.   MRN: [id number]  HPI    Review of Systems     Objective:   Physical Exam        Assessment & Plan:

## 2024-01-19 ENCOUNTER — Encounter
Payer: PRIVATE HEALTH INSURANCE | Attending: Physical Medicine and Rehabilitation | Admitting: Physical Medicine & Rehabilitation

## 2024-01-19 ENCOUNTER — Encounter: Payer: Self-pay | Admitting: Physical Medicine & Rehabilitation

## 2024-01-19 VITALS — BP 105/74 | HR 90 | Temp 97.9°F | Ht 60.0 in | Wt 210.0 lb

## 2024-01-19 DIAGNOSIS — R300 Dysuria: Secondary | ICD-10-CM | POA: Diagnosis present

## 2024-01-19 DIAGNOSIS — M797 Fibromyalgia: Secondary | ICD-10-CM | POA: Diagnosis present

## 2024-01-19 DIAGNOSIS — M47817 Spondylosis without myelopathy or radiculopathy, lumbosacral region: Secondary | ICD-10-CM | POA: Diagnosis present

## 2024-01-19 DIAGNOSIS — G894 Chronic pain syndrome: Secondary | ICD-10-CM | POA: Insufficient documentation

## 2024-01-19 MED ORDER — IOHEXOL 180 MG/ML  SOLN
2.0000 mL | Freq: Once | INTRAMUSCULAR | Status: AC
  Administered 2024-01-19: 2 mL

## 2024-01-19 MED ORDER — LIDOCAINE HCL (PF) 2 % IJ SOLN
4.0000 mL | Freq: Once | INTRAMUSCULAR | Status: AC
  Administered 2024-01-19: 4 mL

## 2024-01-19 MED ORDER — LIDOCAINE HCL 1 % IJ SOLN
10.0000 mL | Freq: Once | INTRAMUSCULAR | Status: AC
  Administered 2024-01-19: 10 mL

## 2024-01-19 NOTE — Progress Notes (Signed)
  Right lumbar L3, L4 medial branch blocks and L5 dorsal ramus injection under fluoroscopic guidance  Indication: Right Lumbar pain which is not relieved by medication management or other conservative care and interfering with self-care and mobility.  Informed consent was obtained after describing risks and benefits of the procedure with the patient, this includes bleeding, bruising, infection, paralysis and medication side effects. The patient wishes to proceed and has given written consent. The patient was placed in a prone position. The lumbar area was marked and prepped with Betadine. One ML of 1% lidocaine was injected into each of 3 areas into the skin and subcutaneous tissue. Then a 22-gauge 5inch spinal needle was inserted targeting the junction of the Right S1 superior articular process and sacral ala junction. Needle was advanced under fluoroscopic guidance. Bone contact was made.Isovue 200 was injected x0.5 mL demonstrating no intravascular uptake. Then a solution containing 2% MPF lidocaine was injected x0.5 mL. Then the Right L5 superior articular process in transverse process junction was targeted. Bone contact was made.Isovue 200 was injected x0.5 mL demonstrating no intravascular uptake. Then a solution containing 2% MPF lidocaine was injected x0.5 mL. Then the Right L4 superior articular process in transverse process junction was targeted. Bone contact was made. Isovue 200 was injected x0.5 mL demonstrating no intravascular uptake. Then a solution containing2% MPF lidocaine was injected x0.5 mL Patient tolerated procedure well. Post procedure instructions were given. Please refer to post procedure form.

## 2024-01-26 NOTE — Progress Notes (Unsigned)
 Subjective:    Patient ID: Diana Giles, female    DOB: 01-28-1971, 53 y.o.   MRN: [id number]  HPI   Diana Giles is a 53 y.o. year old female  who  has a past medical history of Allergy, Anemia, Anxiety, Avascular necrosis of hip (HCC), Blood transfusion without reported diagnosis, Bunion of left foot, COVID-19, DDD (degenerative disc disease), lumbar, Fibromyalgia, GERD (gastroesophageal reflux disease), Herpes simplex virus (HSV) infection, HPV in female, Mental disorder, Rheumatoid arthritis (HCC), Sinus node dysfunction (HCC), Sleep apnea, Thrombocytosis, and Vaginal Pap smear, abnormal.     They are presenting to PM&R clinic as follow up for chronic pain from OA, FM, DDD, and low back pain with  sided lumbar radiculopathy and SI joint pain.  Plan from last visit: Chronic pain syndrome Fibromyalgia I given you a temporary prescription of tramadol #60 tabs to use during severe pain flares for the next 3 months.  I will not refill this medication until her next appointment, so try to be judicious with medication use.   Given benefits of aqua therapy, I recommend that you either join a gym with the pool or obtain access to one through your apartment and resume gentle range of motion and endurance exercises to help with joint offloading.   Follow-up with me in 3 months.   Today, I am also writing an accommodations letter for your work.  Let me know if you need any additional documentation for this. Accomodations are as follows:  - at least to 20-minute rest breaks per 12-hour shifts where she can sit and rest, outside of her regularly scheduled lunch break.   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  Stop gabapentin, and start Lyrica 50 mg capsules twice daily for 1 week.  After that, increase  it to 50 mg capsules 3 times daily for 1 week.  After that, increase it to 100 mg or 2 capsules twice daily.  Do not increase it further than this without following up with a physician.  If you notice any severe side effects from the medication, reduced to the last tolerable dose or stop and resume gabapentin.  Regardless, I want to hear from you through MyChart or through the clinic in 2 weeks to know how it is going.   She notes nerve ablations in her back when she lived in [place] with 3 months pain relief. I am referring you back to Dr. Wynn Banker for medial branch blocks of the lower spine and possibly the SI joints.   Bilateral sacroiliitis (HCC) Had SI joint injection with Dr. Wynn Banker, without benefit    Interval Hx:  - Therapies:   She is on a flare currently from getting fired, has not been doing much.    - Follow ups: Dr. Wynn Banker 3/13 Right lumbar L3, L4 medial branch blocks and L5 dorsal ramus injection under fluoroscopic guidance - "my back is definitely feeling better." She thinks it has relieved her pain by >50%; for the first few hours it was 90-100% gone.    - Falls:none   - WUJ:WJXB   - Medications:  "I was doing so great not taking my klonopin until she started threatening to fire me."   Meloxicam - helps "here and there"  Lyrica 100 mg BID - going ok, makes a difference in her  radiating pain feeling. Does not make her sleepy.   Tramadol 50 mg BID PRN - she has been using it more often recently; she does feel like it benefits her.    - Other concerns: She says she lost her job on Friday; she is unsure what happened, was not given a reason. She thinks it was because she wasn't given enough notice about this appointment. She put in her FMLA for 3/21 instead of 3/24, and "she was looking for something to fire me from". She reported her boss to HR and feels this was somewhat retaliatory. She is not getting any severance or benefits. She had worked for 19 years for her company  and feels very betrayed by this.   She has family she has been talking to and is getting external support. She does not have a therapist in [place], Her PCP tried to help her but she was referred to a practice that was not taking patients.     Pain Inventory Average Pain 7 Pain Right Now 5 My pain is burning, dull, and stabbing  In the last 24 hours, has pain interfered with the following? General activity 6 Relation with others 0 Enjoyment of life 6 What TIME of day is your pain at its worst? morning  Sleep (in general) Fair  Pain is worse with: walking, bending, sitting, standing, some activites, and if doing these things for a long period of time Pain improves with: rest and medication Relief from Meds: 5  Family History  Problem Relation Age of Onset   Hypertension Mother    Stroke Mother    Asthma Mother    COPD Mother    Early death Mother    Diabetes Father    Heart disease Father    Early death Father    Kidney disease Father    Hyperthyroidism Sister    COPD Sister    Asthma Sister    COPD Sister    Miscarriages / Stillbirths Sister    Obesity Brother    Hypertension Brother    Asthma Brother    Anxiety disorder Brother    Asthma Brother    Obesity Brother    Diabetes Brother    Asthma Brother    Asthma Brother    Polycystic ovary syndrome Daughter    Asthma Son    Obesity Son    ADD / ADHD Son    Colon cancer Cousin    Rectal cancer Neg Hx    Stomach cancer Neg Hx    Social History   Socioeconomic History   Marital status: Legally Separated    Spouse name: Not on file   Number of children: 2   Years of education: Not on file   Highest education level: Associate degree: occupational, Scientist, product/process development, or vocational program  Occupational History   Not on file  Tobacco Use   Smoking status: Never    Passive exposure: Past   Smokeless tobacco: Never  Vaping Use   Vaping status: Never Used  Substance and Sexual Activity   Alcohol use: Not Currently     Comment: Occasionally   Drug use: Never   Sexual activity: Not Currently    Birth control/protection: Post-menopausal, Surgical    Comment: tubal  Other Topics Concern   Not on file  Social History Narrative   Separated for 12 years,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   Caffiene coffee 2 cups.  Cokes 2 daily.   Social Drivers  of Health   Financial Resource Strain: High Risk (10/13/2023)   Overall Financial Resource Strain (CARDIA)    Difficulty of Paying Living Expenses: Hard  Food Insecurity: Food Insecurity Present (10/13/2023)   Hunger Vital Sign    Worried About Running Out of Food in the Last Year: Sometimes true    Ran Out of Food in the Last Year: Sometimes true  Transportation Needs: No Transportation Needs (10/13/2023)   PRAPARE - Administrator, Civil Service (Medical): No    Lack of Transportation (Non-Medical): No  Physical Activity: Insufficiently Active (10/13/2023)   Exercise Vital Sign    Days of Exercise per Week: 1 day    Minutes of Exercise per Session: 20 min  Stress: Stress Concern Present (10/13/2023)   [employer] of Occupational Health - Occupational Stress Questionnaire    Feeling of Stress : Very much  Social Connections: Socially Isolated (10/13/2023)   Social Connection and Isolation Panel [NHANES]    Frequency of Communication with Friends and Family: Once a week    Frequency of Social Gatherings with Friends and Family: Once a week    Attends Religious Services: 1 to 4 times per year    Active Member of [employer] or Organizations: No    Attends Engineer, structural: Not on file    Marital Status: Separated   Past Surgical History:  Procedure Laterality Date   CESAREAN SECTION  06/02/94   GASTRIC BYPASS  2010   285lbs at highest weight   HYSTEROSCOPY WITH D & C  08/22/2019   JOINT REPLACEMENT  05/2019   Revision Right knee   OTHER SURGICAL HISTORY  08/2019   Bilateral fallopian tube removal    PACEMAKER PLACEMENT  08/06/2020   REVISION TOTAL KNEE ARTHROPLASTY Right 06/01/2019   SHOULDER ARTHROSCOPY Left 2018   TUBAL LIGATION Bilateral 08/07/2019   Past Surgical History:  Procedure Laterality Date   CESAREAN SECTION  06/02/94   GASTRIC BYPASS  2010   285lbs at highest weight   HYSTEROSCOPY WITH D & C  08/22/2019   JOINT REPLACEMENT  05/2019   Revision Right knee   OTHER SURGICAL HISTORY  08/2019   Bilateral fallopian tube removal   PACEMAKER PLACEMENT  08/06/2020   REVISION TOTAL KNEE ARTHROPLASTY Right 06/01/2019   SHOULDER ARTHROSCOPY Left 2018   TUBAL LIGATION Bilateral 08/07/2019   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    Mental disorder    Rheumatoid arthritis (HCC)    Sinus node dysfunction (HCC)    s/p pacemaker   Sleep apnea    Thrombocytosis    Vaginal Pap smear, abnormal    BP 120/80   Pulse 76   Ht 5' (1.524 m)   Wt 209 lb (94.8 kg)   SpO2 96%   BMI 40.82 kg/m   Opioid Risk Score:   Fall Risk Score:  `1  Depression screen [facility] 2/9     01/19/2024   10:20 AM 11/18/2023    1:50 PM 10/14/2023   10:43 AM 07/15/2023   10:17 AM 04/29/2023    2:07 PM  04/15/2023    3:47 PM 03/25/2023    3:49 PM  Depression screen PHQ 2/9  Decreased Interest 0 1 0 2 1 0 0  Down, Depressed, Hopeless 0 1 1 1 1  0 0  PHQ - 2 Score 0 2 1 3 2  0 0  Altered sleeping    1     Tired, decreased energy    2     Change in appetite    0     Feeling bad or failure about yourself     0     Trouble concentrating    1     Moving slowly or fidgety/restless    1     Suicidal thoughts    0     PHQ-9 Score    8     Difficult doing work/chores    Somewhat difficult        Review of Systems  Musculoskeletal:  Positive for back pain and neck pain.       Bilateral hip pain Right knee pain Right hamstring pain Burning in hands  All other systems reviewed and are negative.      Objective:   Physical Exam  PE: Constitution: Appropriate appearance for age. No apparent distress  +Obese Resp: No respiratory distress. No accessory muscle usage. on RA Cardio: Well perfused appearance. No peripheral edema. Abdomen: Nondistended. Nontender. +BS Psych: Appropriate mood and affect. Neuro: AAOx4. No apparent cognitive deficits    Neurologic Exam:   Sensory exam: + Decreased sensation in R lateral knee, extending along lateral calf. LLE intact.  Motor exam: strength 5/5 throughout bilateral upper extremities and bilateral lower extremities Coordination: Fine motor coordination was normal.     Back MSK:  + Bilateral facet loading,  + TTP lumbar paraspinals - unchanged from prior exams     Assessment & Plan:   Jazzman Loughmiller is a 53 y.o. year old female  who  has a past medical history of Allergy, Anemia, Anxiety, Avascular necrosis of hip (HCC), Blood transfusion without reported diagnosis, Bunion of left foot, COVID-19, DDD (degenerative disc disease), lumbar, Fibromyalgia, GERD (gastroesophageal reflux disease), Herpes simplex virus (HSV) infection, HPV in female, Mental disorder, Rheumatoid arthritis (HCC), Sinus node dysfunction (HCC), Sleep apnea, Thrombocytosis, and Vaginal Pap smear, abnormal.  They are presenting to PM&R clinic as follow up for chronic pain from OA, FM, DDD, and low back pain with  sided lumbar radiculopathy (MBB 01/19/24) and SI joint pain (failed injections).  Chronic pain syndrome Lumbosacral spondylosis without myelopathy Fibromyalgia Continue current medications and concentrate on getting through this tough time (getting let go from her job) first; she has a support system and is talking to her PCP about getting set up with  behavioral health. No active SI, HI.   She will reach out to me through [facility] when she  needs a tramadol refill  Continue medial branch blocks with Dr. Wynn Banker - she had excellent results from this, 100% back pain relief day-of injection and sustained relief >50% ongoing.   Will work on getting back in the pool once life stressors are better under control.   Follow up in 3-4 months  Dysuria Reach out to Dr. Wallace Cullens about concern for a UTI

## 2024-01-30 ENCOUNTER — Encounter: Payer: PRIVATE HEALTH INSURANCE | Admitting: Physical Medicine and Rehabilitation

## 2024-01-30 ENCOUNTER — Ambulatory Visit: Payer: PRIVATE HEALTH INSURANCE | Admitting: Family Medicine

## 2024-01-30 VITALS — BP 120/80 | HR 76 | Ht 60.0 in | Wt 209.0 lb

## 2024-01-30 DIAGNOSIS — G894 Chronic pain syndrome: Secondary | ICD-10-CM | POA: Diagnosis not present

## 2024-01-30 DIAGNOSIS — M797 Fibromyalgia: Secondary | ICD-10-CM | POA: Diagnosis not present

## 2024-01-30 DIAGNOSIS — R300 Dysuria: Secondary | ICD-10-CM

## 2024-01-30 DIAGNOSIS — D219 Benign neoplasm of connective and other soft tissue, unspecified: Secondary | ICD-10-CM | POA: Insufficient documentation

## 2024-01-30 DIAGNOSIS — M47817 Spondylosis without myelopathy or radiculopathy, lumbosacral region: Secondary | ICD-10-CM | POA: Diagnosis not present

## 2024-01-30 NOTE — Patient Instructions (Signed)
 Continue current medications and concentrate on getting through this first; reach out to me through mychart when you need a tramadol refill  Reach out to Dr. Wallace Cullens about concern for a UTI  Continue medial branch blocks with Dr. Wynn Banker  Follow up in 3-4 months

## 2024-02-03 ENCOUNTER — Ambulatory Visit (INDEPENDENT_AMBULATORY_CARE_PROVIDER_SITE_OTHER): Payer: No Typology Code available for payment source

## 2024-02-03 DIAGNOSIS — R300 Dysuria: Secondary | ICD-10-CM | POA: Insufficient documentation

## 2024-02-03 DIAGNOSIS — I495 Sick sinus syndrome: Secondary | ICD-10-CM

## 2024-02-06 ENCOUNTER — Inpatient Hospital Stay: Payer: PRIVATE HEALTH INSURANCE | Attending: Oncology

## 2024-02-06 DIAGNOSIS — D509 Iron deficiency anemia, unspecified: Secondary | ICD-10-CM | POA: Diagnosis present

## 2024-02-06 LAB — CBC WITH DIFFERENTIAL/PLATELET
Abs Immature Granulocytes: 0.02 10*3/uL (ref 0.00–0.07)
Basophils Absolute: 0 10*3/uL (ref 0.0–0.1)
Basophils Relative: 0 %
Eosinophils Absolute: 0.1 10*3/uL (ref 0.0–0.5)
Eosinophils Relative: 1 %
HCT: 38.3 % (ref 36.0–46.0)
Hemoglobin: 12.5 g/dL (ref 12.0–15.0)
Immature Granulocytes: 0 %
Lymphocytes Relative: 31 %
Lymphs Abs: 2.2 10*3/uL (ref 0.7–4.0)
MCH: 31.2 pg (ref 26.0–34.0)
MCHC: 32.6 g/dL (ref 30.0–36.0)
MCV: 95.5 fL (ref 80.0–100.0)
Monocytes Absolute: 0.5 10*3/uL (ref 0.1–1.0)
Monocytes Relative: 6 %
Neutro Abs: 4.3 10*3/uL (ref 1.7–7.7)
Neutrophils Relative %: 62 %
Platelets: 326 10*3/uL (ref 150–400)
RBC: 4.01 MIL/uL (ref 3.87–5.11)
RDW: 12.5 % (ref 11.5–15.5)
WBC: 7 10*3/uL (ref 4.0–10.5)
nRBC: 0 % (ref 0.0–0.2)

## 2024-02-06 LAB — CUP PACEART REMOTE DEVICE CHECK
Battery Remaining Longevity: 142 mo
Battery Voltage: 3.03 V
Brady Statistic AP VP Percent: 0.02 %
Brady Statistic AP VS Percent: 5.13 %
Brady Statistic AS VP Percent: 0.04 %
Brady Statistic AS VS Percent: 94.81 %
Brady Statistic RA Percent Paced: 5.16 %
Brady Statistic RV Percent Paced: 0.05 %
Date Time Interrogation Session: 20250327221342
Implantable Lead Connection Status: 753985
Implantable Lead Connection Status: 753985
Implantable Lead Implant Date: 20210928
Implantable Lead Implant Date: 20210928
Implantable Lead Location: 753859
Implantable Lead Location: 753860
Implantable Lead Model: 4076
Implantable Lead Model: 4076
Implantable Pulse Generator Implant Date: 20210928
Lead Channel Impedance Value: 304 Ohm
Lead Channel Impedance Value: 361 Ohm
Lead Channel Impedance Value: 380 Ohm
Lead Channel Impedance Value: 456 Ohm
Lead Channel Pacing Threshold Amplitude: 0.625 V
Lead Channel Pacing Threshold Amplitude: 0.625 V
Lead Channel Pacing Threshold Pulse Width: 0.4 ms
Lead Channel Pacing Threshold Pulse Width: 0.4 ms
Lead Channel Sensing Intrinsic Amplitude: 1.875 mV
Lead Channel Sensing Intrinsic Amplitude: 1.875 mV
Lead Channel Sensing Intrinsic Amplitude: 6 mV
Lead Channel Sensing Intrinsic Amplitude: 6 mV
Lead Channel Setting Pacing Amplitude: 1.5 V
Lead Channel Setting Pacing Amplitude: 2 V
Lead Channel Setting Pacing Pulse Width: 0.4 ms
Lead Channel Setting Sensing Sensitivity: 0.9 mV
Zone Setting Status: 755011

## 2024-02-06 LAB — FERRITIN: Ferritin: 46 ng/mL (ref 11–307)

## 2024-02-06 LAB — IRON AND TIBC
Iron: 87 ug/dL (ref 28–170)
Saturation Ratios: 26 % (ref 10.4–31.8)
TIBC: 335 ug/dL (ref 250–450)
UIBC: 248 ug/dL

## 2024-02-07 ENCOUNTER — Encounter: Payer: Self-pay | Admitting: Oncology

## 2024-02-07 ENCOUNTER — Inpatient Hospital Stay: Payer: PRIVATE HEALTH INSURANCE

## 2024-02-07 ENCOUNTER — Inpatient Hospital Stay: Payer: PRIVATE HEALTH INSURANCE | Attending: Oncology | Admitting: Oncology

## 2024-02-07 ENCOUNTER — Telehealth: Payer: Self-pay | Admitting: Adult Health

## 2024-02-07 VITALS — BP 105/60 | HR 74 | Temp 98.6°F | Resp 20 | Wt 210.5 lb

## 2024-02-07 DIAGNOSIS — Z9884 Bariatric surgery status: Secondary | ICD-10-CM | POA: Diagnosis not present

## 2024-02-07 DIAGNOSIS — D509 Iron deficiency anemia, unspecified: Secondary | ICD-10-CM | POA: Insufficient documentation

## 2024-02-07 NOTE — Progress Notes (Unsigned)
 [facility]  Telephone:([phone]) [phone] Fax:(336) [phone]  ID: Diana Giles OB: Jul 27, 1971  MR#: [id number]  KVQ#:[id number]  Patient Care Team: [name], NP as PCP - General (Nurse Practitioner) [name], MD as PCP - Electrophysiology (Cardiology) Lanelle Bal, DO as Consulting Physician (Internal Medicine) Pollyann Savoy, MD as Consulting Physician (Rheumatology) Jeralyn Ruths, MD as Consulting Physician (Oncology)  CHIEF COMPLAINT: Iron deficiency anemia.  INTERVAL HISTORY: Patient returns to clinic today for repeat laboratory work, further evaluation, and consideration of additional IV Venofer.  She feels improved after receiving IV iron, but continues to have chronic fatigue.  She otherwise feels well.  She has no neurologic complaints.  She denies any recent fevers or illnesses.  She has a good appetite and denies weight loss.  She has no chest pain, shortness of breath, cough, or hemoptysis.  She denies any nausea, vomiting, constipation, or diarrhea.  She has no melena or hematochezia.  She has no urinary complaints.  Patient offers no further specific complaints today.  REVIEW OF SYSTEMS:   Review of Systems  Constitutional:  Positive for malaise/fatigue. Negative for fever and weight loss.  Respiratory: Negative.  Negative for cough, hemoptysis and shortness of breath.   Cardiovascular: Negative.  Negative for chest pain and leg swelling.  Gastrointestinal: Negative.  Negative for abdominal pain, blood in stool and melena.  Genitourinary: Negative.  Negative for hematuria.  Musculoskeletal: Negative.  Negative for back pain.  Skin: Negative.  Negative for rash.  Neurological: Negative.  Negative for dizziness, focal weakness, weakness and headaches.  Psychiatric/Behavioral: Negative.  The patient is not nervous/anxious.     As per HPI. Otherwise, a complete review of systems is negative.  PAST MEDICAL HISTORY: Past Medical  History:  Diagnosis Date   Allergy    Anemia    Anxiety    Avascular necrosis of hip (HCC)    Blood transfusion without reported diagnosis    Bunion of left foot    COVID-19    DDD (degenerative disc disease), lumbar    Fibromyalgia    GERD (gastroesophageal reflux disease)    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    Mental disorder    Rheumatoid arthritis (HCC)    Sinus node dysfunction (HCC)    s/p pacemaker   Sleep apnea    Thrombocytosis    Vaginal Pap smear, abnormal     PAST SURGICAL HISTORY: Past Surgical History:  Procedure Laterality Date   CESAREAN SECTION  06/02/94   GASTRIC BYPASS  2010   285lbs at highest weight   HYSTEROSCOPY WITH D & C  08/22/2019   JOINT REPLACEMENT  05/2019   Revision Right knee   OTHER SURGICAL HISTORY  08/2019   Bilateral fallopian tube removal   PACEMAKER PLACEMENT  08/06/2020   REVISION TOTAL KNEE ARTHROPLASTY Right 06/01/2019   SHOULDER ARTHROSCOPY Left 2018   TUBAL LIGATION Bilateral 08/07/2019    FAMILY HISTORY: Family History  Problem Relation Age of Onset   Hypertension Mother    Stroke Mother    Asthma Mother    COPD Mother    Early death Mother    Diabetes Father    Heart disease Father    Early death Father    Kidney disease Father    Hyperthyroidism Sister    COPD Sister    Asthma Sister    COPD Sister    Miscarriages / Stillbirths Sister    Obesity Brother  Hypertension Brother    Asthma Brother    Anxiety disorder Brother    Asthma Brother    Obesity Brother    Diabetes Brother    Asthma Brother    Asthma Brother    Polycystic ovary syndrome Daughter    Asthma Son    Obesity Son    ADD / ADHD Son    Colon cancer Cousin    Rectal cancer Neg Hx    Stomach cancer Neg Hx     ADVANCED DIRECTIVES (Y/N):  N  HEALTH MAINTENANCE: Social History   Tobacco Use   Smoking status: Never    Passive exposure: Past   Smokeless tobacco: Never  Vaping Use   Vaping status: Never Used   Substance Use Topics   Alcohol use: Not Currently    Comment: Occasionally   Drug use: Never     Colonoscopy:  PAP:  Bone density:  Lipid panel:  Allergies  Allergen Reactions   Nickel Other (See Comments)    Other Reaction(s): Not available   Penicillins Other (See Comments)    Other Reaction(s): Not available   Shellfish Allergy    Sulfa Antibiotics Rash    Current Outpatient Medications  Medication Sig Dispense Refill   acetaminophen (TYLENOL) 500 MG tablet Take 500 mg by mouth every 6 (six) hours as needed.     busPIRone (BUSPAR) 10 MG tablet TAKE 1 TABLET BY MOUTH TWICE A DAY 180 tablet 1   Cholecalciferol (VITAMIN D3) 125 MCG (5000 UT) CAPS Take 2 capsules by mouth in the morning and at bedtime. 2 in AM & 2 in PM. (10,000iu /2xday).     clonazePAM (KLONOPIN) 1 MG tablet TAKE 1 TABLET BY MOUTH TWICE A DAY AS NEEDED FOR ANXIETY 60 tablet 0   docusate sodium (COLACE) 100 MG capsule Take 100 mg by mouth daily as needed for mild constipation.     DULoxetine (CYMBALTA) 60 MG capsule TAKE 1 CAPSULE BY MOUTH 2 TIMES DAILY. 180 capsule 1   EPINEPHrine 0.3 mg/0.3 mL IJ SOAJ injection Inject 0.3 mg into the muscle as needed for anaphylaxis. 1 each 2   ibuprofen (ADVIL) 200 MG tablet Take 400 mg by mouth every 6 (six) hours as needed.     nystatin cream (MYCOSTATIN) APPLY TO AFFECTED AREA TWICE A DAY 15 g 0   omeprazole (PRILOSEC) 20 MG capsule Take 20 mg by mouth daily.     pregabalin (LYRICA) 100 MG capsule Take 1 capsule (100 mg total) by mouth 2 (two) times daily. 60 capsule 3   progesterone (PROMETRIUM) 200 MG capsule TAKE 1 DAILY FOR 21 DAYS EVERY MONTH 63 capsule 4   traMADol (ULTRAM) 50 MG tablet TAKE 1 TABLET (50 MG TOTAL) BY MOUTH 2 (TWO) TIMES DAILY AS NEEDED FOR SEVERE PAIN (PAIN SCORE 7-10). 60 tablet 0   valACYclovir (VALTREX) 500 MG tablet TAKE 1 TABLET BY MOUTH TWICE A DAY 180 tablet 3   vitamin B-12 (CYANOCOBALAMIN) 500 MCG tablet Take 500 mcg by mouth daily.      No current facility-administered medications for this visit.    OBJECTIVE: Vitals:   02/07/24 1310  BP: 105/60  Pulse: 74  Resp: 20  Temp: 98.6 F (37 C)  SpO2: 100%      Body mass index is 41.11 kg/m.    ECOG FS:0 - Asymptomatic   General: Well-developed, well-nourished, no acute distress. Eyes: Pink conjunctiva, anicteric sclera. HEENT: Normocephalic, moist mucous membranes. Lungs: No audible wheezing or coughing. Heart: Regular rate  and rhythm. Abdomen: Soft, nontender, no obvious distention. Musculoskeletal: No edema, cyanosis, or clubbing. Neuro: Alert, answering all questions appropriately. Cranial nerves grossly intact. Skin: No rashes or petechiae noted. Psych: Normal affect.  LAB RESULTS:  Lab Results  Component Value Date   NA 139 04/15/2023   K 4.1 04/15/2023   CL 104 04/15/2023   CO2 28 04/15/2023   GLUCOSE 80 04/15/2023   BUN 17 04/15/2023   CREATININE 0.78 04/15/2023   CALCIUM 8.8 04/15/2023   PROT 7.3 04/15/2023   ALBUMIN 4.1 04/15/2023   AST 16 04/15/2023   ALT 11 04/15/2023   ALKPHOS 120 (H) 04/15/2023   BILITOT 0.4 04/15/2023   GFRNONAA >60 04/05/2023   GFRAA 121 04/09/2021    Lab Results  Component Value Date   WBC 7.0 02/06/2024   NEUTROABS 4.3 02/06/2024   HGB 12.5 02/06/2024   HCT 38.3 02/06/2024   MCV 95.5 02/06/2024   PLT 326 02/06/2024   Lab Results  Component Value Date   IRON 87 02/06/2024   TIBC 335 02/06/2024   IRONPCTSAT 26 02/06/2024   Lab Results  Component Value Date   FERRITIN 46 02/06/2024     STUDIES: CUP PACEART REMOTE DEVICE CHECK Result Date: 02/06/2024 Scheduled remote reviewed. Normal device function.  Presenting rhythm:  AS/VS Next remote 91 days. LA, CVRS   ASSESSMENT: Iron deficiency anemia.  PLAN:    Iron deficiency anemia: Likely secondary to poor absorption from history of gastric bypass.  Colonoscopy and EGD completed on June 03, 2023 did not reveal any significant pathology.  Patient's  hemoglobin and iron stores are now within normal limits.  She does not require additional IV Venofer today.  She last received treatment on May 20, 2023.  No intervention is needed.  Return to clinic in 4 months with repeat laboratory work, further evaluation, and continuation of treatment if needed.    I spent a total of 20 minutes reviewing chart data, face-to-face evaluation with the patient, counseling and coordination of care as detailed above.   Patient expressed understanding and was in agreement with this plan. She also understands that She can call clinic at any time with any questions, concerns, or complaints.    Jeralyn Ruths, MD   02/07/2024 1:17 PM

## 2024-02-07 NOTE — Telephone Encounter (Signed)
 LVM and sent mychart msg informing pt of need to reschedule 03/01/24 appt - NP out

## 2024-02-08 ENCOUNTER — Encounter: Payer: Self-pay | Admitting: Oncology

## 2024-02-08 ENCOUNTER — Ambulatory Visit (INDEPENDENT_AMBULATORY_CARE_PROVIDER_SITE_OTHER): Payer: PRIVATE HEALTH INSURANCE | Admitting: Nurse Practitioner

## 2024-02-08 VITALS — BP 122/82 | HR 92 | Temp 98.3°F | Ht 60.0 in | Wt 208.1 lb

## 2024-02-08 DIAGNOSIS — F419 Anxiety disorder, unspecified: Secondary | ICD-10-CM

## 2024-02-08 DIAGNOSIS — D75839 Thrombocytosis, unspecified: Secondary | ICD-10-CM | POA: Diagnosis not present

## 2024-02-08 DIAGNOSIS — Z13220 Encounter for screening for lipoid disorders: Secondary | ICD-10-CM | POA: Diagnosis not present

## 2024-02-08 DIAGNOSIS — E66813 Obesity, class 3: Secondary | ICD-10-CM

## 2024-02-08 DIAGNOSIS — Z6841 Body Mass Index (BMI) 40.0 and over, adult: Secondary | ICD-10-CM

## 2024-02-08 DIAGNOSIS — Z136 Encounter for screening for cardiovascular disorders: Secondary | ICD-10-CM

## 2024-02-08 DIAGNOSIS — R300 Dysuria: Secondary | ICD-10-CM

## 2024-02-08 NOTE — Assessment & Plan Note (Signed)
 Chronic, Labs ordered, further recommendations may be made based upon his results

## 2024-02-08 NOTE — Assessment & Plan Note (Signed)
 Labs ordered, further recommendations may be made based upon his results.

## 2024-02-08 NOTE — Assessment & Plan Note (Signed)
 Resolved Point-of-care urinalysis negative for evidence of UTI.  Will send for urine culture to verify.

## 2024-02-08 NOTE — Assessment & Plan Note (Signed)
 Chronic, acutely worsened due to emotional stressors related to losing employment. She does not wish to make changes to medication regimen. Patient encouraged to reach out if mood continues to worsen.  She reports her understanding.

## 2024-02-08 NOTE — Progress Notes (Signed)
 Established Patient Office Visit  Subjective   Patient ID: Diana Giles, female    DOB: May 22, 1971  Age: 53 y.o. MRN: [id number]  Chief Complaint  Patient presents with   Dysuria    Dysuria: Symptom onset about 1 week ago.  [facility], was found to be positive with UTI. Treated with course of nitrofurantoin. Symptoms have resolved. She is here today to verify that UTI has cleared.   Depression: She was recently let go from her job.  She is having difficulty obtaining unemployment as well as finding a new job.  This is causing extreme stress and has triggered her depression.  She denies suicidal ideation.  She is currently on BuSpar 10 mg twice daily, Klonopin 1 mg twice daily as needed, and duloxetine 60 mg twice daily.    ROS: see HPI    Objective:     BP 122/82   Pulse 92   Temp 98.3 F (36.8 C) (Temporal)   Ht 5' (1.524 m)   Wt 208 lb 2 oz (94.4 kg)   SpO2 97%   BMI 40.65 kg/m  BP Readings from Last 3 Encounters:  02/08/24 122/82  02/07/24 105/60  01/30/24 120/80   Wt Readings from Last 3 Encounters:  02/08/24 208 lb 2 oz (94.4 kg)  02/07/24 210 lb 8 oz (95.5 kg)  01/30/24 209 lb (94.8 kg)         02/08/2024    4:01 PM 01/19/2024   10:20 AM 11/18/2023    1:50 PM  PHQ9 SCORE ONLY  PHQ-9 Total Score 9 0 2     Physical Exam Vitals reviewed.  Constitutional:      General: She is not in acute distress.    Appearance: Normal appearance.  HENT:     Head: Normocephalic and atraumatic.  Neck:     Vascular: No carotid bruit.  Cardiovascular:     Rate and Rhythm: Normal rate and regular rhythm.     Pulses: Normal pulses.     Heart sounds: Normal heart sounds.  Pulmonary:     Effort: Pulmonary effort is normal.     Breath sounds: Normal breath sounds.  Abdominal:     Tenderness: There is no right CVA tenderness or left CVA tenderness.  Skin:    General: Skin is warm and dry.  Neurological:     General: No focal deficit present.      Mental Status: She is alert and oriented to person, place, and time.  Psychiatric:        Mood and Affect: Mood normal.        Behavior: Behavior normal.        Judgment: Judgment normal.      No results found for any visits on 02/08/24.    The 10-year ASCVD risk score (Arnett DK, et al., 2019) is: 0.8%    Assessment & Plan:   Problem List Items Addressed This Visit       Hematopoietic and Hemostatic   Thrombocytosis   Chronic, Labs ordered, further recommendations may be made based upon his results       Relevant Orders   CBC   Comprehensive metabolic panel with GFR   Hemoglobin A1c   Lipid panel   TSH     Other   Anxiety   Chronic, acutely worsened due to emotional stressors related to losing employment. She does not wish to make changes to medication regimen. Patient encouraged to reach out if mood continues  to worsen.  She reports her understanding.      Relevant Orders   CBC   Comprehensive metabolic panel with GFR   Hemoglobin A1c   Lipid panel   TSH   Encounter for lipid screening for cardiovascular disease   Labs ordered, further recommendations may be made based upon his results       Relevant Orders   CBC   Comprehensive metabolic panel with GFR   Hemoglobin A1c   Lipid panel   TSH   Dysuria - Primary   Resolved Point-of-care urinalysis negative for evidence of UTI.  Will send for urine culture to verify.      Relevant Orders   Urinalysis, Routine w reflex microscopic   Urine Culture   CBC   Comprehensive metabolic panel with GFR   Hemoglobin A1c   Lipid panel   TSH   Class 3 severe obesity with body mass index (BMI) of 40.0 to 44.9 in adult [facility])   Labs ordered, further recommendations may be made based upon his results       Relevant Orders   CBC   Comprehensive metabolic panel with GFR   Hemoglobin A1c   Lipid panel   TSH    Return in about 6 months (around 08/09/2024) for CPE with Maralyn Sago.    [name], NP

## 2024-02-09 ENCOUNTER — Encounter: Payer: Self-pay | Admitting: Nurse Practitioner

## 2024-02-09 LAB — URINALYSIS, ROUTINE W REFLEX MICROSCOPIC
Bilirubin Urine: NEGATIVE
Hgb urine dipstick: NEGATIVE
Ketones, ur: NEGATIVE
Leukocytes,Ua: NEGATIVE
Nitrite: NEGATIVE
RBC / HPF: NONE SEEN (ref 0–?)
Specific Gravity, Urine: 1.015 (ref 1.000–1.030)
Total Protein, Urine: NEGATIVE
Urine Glucose: NEGATIVE
Urobilinogen, UA: 1 (ref 0.0–1.0)
pH: 6 (ref 5.0–8.0)

## 2024-02-09 LAB — URINE CULTURE: Result:: NO GROWTH

## 2024-02-10 ENCOUNTER — Encounter: Payer: Self-pay | Admitting: Cardiology

## 2024-02-10 ENCOUNTER — Encounter: Payer: Self-pay | Admitting: Nurse Practitioner

## 2024-02-12 ENCOUNTER — Other Ambulatory Visit: Payer: Self-pay | Admitting: Physical Medicine and Rehabilitation

## 2024-02-14 ENCOUNTER — Other Ambulatory Visit: Payer: Self-pay | Admitting: Physical Medicine and Rehabilitation

## 2024-02-14 MED ORDER — TRAMADOL HCL 50 MG PO TABS: 50.0000 mg | ORAL_TABLET | Freq: Two times a day (BID) | ORAL | 2 refills | Status: DC | PRN

## 2024-02-14 NOTE — Telephone Encounter (Signed)
 Further refills contingent on patient getting labs completed.

## 2024-02-17 ENCOUNTER — Encounter: Payer: Self-pay | Admitting: Oncology

## 2024-02-21 NOTE — Progress Notes (Signed)
 Office Visit Note  Patient: Diana Giles             Date of Birth: 03-24-1971           MRN: [id number]             PCP: Zorita Hiss, NP Referring: Zorita Hiss, NP Visit Date: 03/06/2024 Occupation: @GUAROCC @  Subjective:  Pain in joints and muscles.  History of Present Illness: Diana Giles is a 53 y.o. female with osteoarthritis, degenerative disc disease and fibromyalgia syndrome.  She returns today after her last visit on September 02, 2023.  She states she had brain fog from her gabapentin .  She was switched to Lyrica  and having less side effects with it.  She states the combination of Cymbalta   and Lyrica  is taking the edge off.  Patient states that last week she bumped her toe into a door.  She was also having pain and discomfort in her right knee.  She was seen at the [facility] urgent care where she had x-rays of the knees and her foot.  There was no fracture.  She was given a boot and also prednisone  taper.  She states after prednisone  taper her symptoms improved.  She should be wearing boot for the next couple of weeks.  She has been ambulating with the help of a cane.  Patient states that in March she was laid off from her job.  She is trying to training and do other feels.  She states she had lumbar spine injection about 6 weeks ago by pain management.    Activities of Daily Living:  Patient reports morning stiffness for 10 minutes.   Patient Reports nocturnal pain.  Difficulty dressing/grooming: Reports Difficulty climbing stairs: Reports Difficulty getting out of chair: Reports Difficulty using hands for taps, buttons, cutlery, and/or writing: Reports  Review of Systems  Constitutional:  Positive for fatigue.  HENT:  Positive for mouth dryness. Negative for mouth sores.   Eyes:  Negative for dryness.  Respiratory:  Negative for shortness of breath.   Cardiovascular:  Positive for palpitations. Negative for chest pain.  Gastrointestinal:  Positive for  constipation. Negative for blood in stool and diarrhea.  Endocrine: Negative for increased urination.  Genitourinary:  Negative for involuntary urination.  Musculoskeletal:  Positive for joint pain, gait problem, joint pain, joint swelling, myalgias, muscle weakness, morning stiffness, muscle tenderness and myalgias.  Skin:  Positive for color change, rash and hair loss. Negative for sensitivity to sunlight.  Allergic/Immunologic: Negative for susceptible to infections.  Neurological:  Negative for dizziness and headaches.  Hematological:  Negative for swollen glands.  Psychiatric/Behavioral:  Positive for depressed mood and sleep disturbance. The patient is nervous/anxious.     PMFS History:  Patient Active Problem List   Diagnosis Date Noted   Class 3 severe obesity with body mass index (BMI) of 40.0 to 44.9 in adult [facility]) 02/08/2024   Dysuria 02/03/2024   Lumbosacral spondylosis without myelopathy 01/30/2024   Fibroids 01/30/2024   Abdominal pain 10/14/2023   Chronic constipation 10/14/2023   History of anaphylaxis 07/15/2023   Rash 07/15/2023   Acute pain of right shoulder 07/15/2023   Iron  deficiency anemia, unspecified 04/29/2023   Anemia 04/15/2023   Abnormal TSH 04/15/2023   Generalized abdominal pain 03/25/2023   Change in nail appearance 03/25/2023   Diabetes mellitus screening 03/25/2023   Encounter for lipid screening for cardiovascular disease 03/25/2023   Encounter for hepatitis C screening test for low risk patient 03/25/2023  Encounter for screening mammogram for malignant neoplasm of breast 03/25/2023   COVID-19 11/26/2022   Chronic bilateral low back pain with right-sided sciatica 09/15/2022   Arthritis pain 09/15/2022   Fibromyalgia 09/15/2022   Chronic pain syndrome 09/15/2022   Bilateral sacroiliitis (HCC) 09/15/2022   Postmenopause 08/09/2022   Pacemaker 01/07/2021   Fatigue 11/13/2020   DDD (degenerative disc disease), lumbar 11/13/2020   Avascular  necrosis of bone of hip, right (HCC) 11/13/2020   Sinus node dysfunction (HCC) 11/13/2020   Thrombocytosis 11/13/2020   Anxiety 11/13/2020   Attention deficit hyperactivity disorder (ADHD) 11/13/2020    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    Mental disorder    Rheumatoid arthritis (HCC)    Sinus node dysfunction (HCC)    s/p pacemaker   Sleep apnea    Thrombocytosis    Vaginal Pap smear, abnormal     Family History  Problem Relation Age of Onset   Hypertension Mother    Stroke Mother    Asthma Mother    COPD Mother    Early death Mother    Diabetes Father    Heart disease Father    Early death Father    Kidney disease Father    Hyperthyroidism Sister    COPD Sister    Asthma Sister    COPD Sister    Miscarriages / Stillbirths Sister    Obesity Brother    Hypertension Brother    Asthma Brother    Anxiety disorder Brother    Asthma Brother    Obesity Brother    Diabetes Brother    Asthma Brother    Asthma Brother    Polycystic ovary syndrome Daughter    Asthma Son    Obesity Son    ADD / ADHD Son    Colon cancer Cousin    Rectal cancer Neg Hx    Stomach cancer Neg Hx    Past Surgical History:  Procedure Laterality Date   CESAREAN SECTION  06/02/94   GASTRIC BYPASS  2010   285lbs at highest weight   HYSTEROSCOPY WITH D & C  08/22/2019   JOINT REPLACEMENT  05/2019   Revision Right knee   OTHER SURGICAL HISTORY  08/2019   Bilateral fallopian tube removal   PACEMAKER PLACEMENT  08/06/2020   REVISION TOTAL KNEE ARTHROPLASTY Right 06/01/2019   SHOULDER ARTHROSCOPY Left 2018   TUBAL LIGATION Bilateral 08/07/2019   Social History   Social History  Narrative   Separated for 12 years,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   Caffiene coffee 2 cups.  Cokes 2 daily.   Immunization History  Administered Date(s) Administered   Influenza, Seasonal, Injecte, Preservative Fre 07/15/2023   Influenza,inj,Quad PF,6+ Mos 08/05/2022   Influenza-Unspecified 07/31/2020, 07/07/2021   PFIZER(Purple Top)SARS-COV-2 Vaccination 02/09/2020, 03/01/2020, 09/18/2020   Tdap 12/17/2020   Zoster Recombinant(Shingrix) 09/23/2020     Objective: Vital Signs: BP 126/89 (BP Location: Left Wrist, Patient Position: Sitting, Cuff Size: Normal)   Pulse 98   Resp 16   Ht 5' (1.524 m)  Wt 211 lb 12.8 oz (96.1 kg)   BMI 41.36 kg/m    Physical Exam Vitals and nursing note reviewed.  Constitutional:      Appearance: She is well-developed.  HENT:     Head: Normocephalic and atraumatic.  Eyes:     Conjunctiva/sclera: Conjunctivae normal.  Cardiovascular:     Rate and Rhythm: Normal rate and regular rhythm.     Heart sounds: Normal heart sounds.  Pulmonary:     Effort: Pulmonary effort is normal.     Breath sounds: Normal breath sounds.  Abdominal:     General: Bowel sounds are normal.     Palpations: Abdomen is soft.  Musculoskeletal:     Cervical back: Normal range of motion.  Lymphadenopathy:     Cervical: No cervical adenopathy.  Skin:    General: Skin is warm and dry.     Capillary Refill: Capillary refill takes less than 2 seconds.  Neurological:     Mental Status: She is alert and oriented to person, place, and time.  Psychiatric:        Behavior: Behavior normal.      Musculoskeletal Exam: She had limited lateral rotation of the cervical spine with bilateral trapezius spasm.  She had limited painful range of motion of lumbar spine.  Shoulders, elbow joints, wrist joints, MCPs PIPs and DIPs Juengel range of motion.  She had discomfort range of motion of her shoulders.  Bilateral PIP and DIP thickening was noted.   She good range of motion of bilateral knee joints with discomfort.  Knee joints were in good range of motion without any warmth swelling or effusion.  There was no tenderness over her ankles.  She had tenderness over her right fifth toe due to recent injury.  She had generalized hyperalgesia and positive tender points.  CDAI Exam: CDAI Score: -- Patient Global: --; Provider Global: -- Swollen: --; Tender: -- Joint Exam 03/06/2024   No joint exam has been documented for this visit   There is currently no information documented on the homunculus. Go to the Rheumatology activity and complete the homunculus joint exam.  Investigation: No additional findings.  Imaging: No results found.   Recent Labs: Lab Results  Component Value Date   WBC 7.0 02/06/2024   HGB 12.5 02/06/2024   PLT 326 02/06/2024   NA 139 04/15/2023   K 4.1 04/15/2023   CL 104 04/15/2023   CO2 28 04/15/2023   GLUCOSE 80 04/15/2023   BUN 17 04/15/2023   CREATININE 0.78 04/15/2023   BILITOT 0.4 04/15/2023   ALKPHOS 120 (H) 04/15/2023   AST 16 04/15/2023   ALT 11 04/15/2023   PROT 7.3 04/15/2023   ALBUMIN 4.1 04/15/2023   CALCIUM 8.8 04/15/2023   GFRAA 121 04/09/2021    Speciality Comments: No specialty comments available.  Procedures:  No procedures performed Allergies: Nickel, Penicillins, Shellfish allergy, and Sulfa antibiotics   Assessment / Plan:     Visit Diagnoses: Chronic left shoulder pain -she has intermittent discomfort.  She had good range of motion today with some discomfort.  XR obtained in the past were unremarkable.  Patient underwent left shoulder arthroscopic surgery 2018.  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  Primary osteoarthritis of both hands-she can history of pain and discomfort in the bilateral hands.  Bilateral PIP and DIP thickening was noted.  Joint protection muscle strengthening  was discussed.  A  handout on hand exercises was given.  Topical agents were discussed.  Trochanteric bursitis of both hips-she has intermittent discomfort in the trochanteric region.  Avascular necrosis of bone of hip, right (HCC)-she had pain for range of motion of her right hip joint.  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  She continues to have chronic pain.  She completed PT. she states recently she started having increased pain and discomfort in her right knee joint.  She was seen at [facility] urgent care where she was given prednisone  taper.  She states the pain improved after the prednisone  taper.  Primary osteoarthritis of left knee-she has been having intermittent discomfort in her left knee.  Primary osteoarthritis of both feet-she gives history of recent injury to her right fifth toe.  She was given a boot.  She has ambulating with the help of a cane.  She states she will be wearing boot for about 2 weeks.  She has a follow-up appointment with the orthopedic surgeon.  Degeneration of intervertebral disc of lumbar region without discogenic back pain or lower extremity pain - She had injections by pain management.  She continues to have lower back pain.    Fibromyalgia-she is on Cymbalta  and Lyrica  which helps to some extent.  She continues to have generalized pain and discomfort.  She lost her job in March 2025.  Patient states she is trying to learn new skills for a sedentary job.  Need for regular exercise and stretching was emphasized.  Other fatigue-she continues to have fatigue.  Other medical problems are listed as follows:  Sinus node dysfunction (HCC)  Pacemaker  Thrombocytosis  History of gastric bypass  History of anxiety  History of ADHD  Orders: No orders of the defined types were placed in this encounter.  No orders of the defined types were placed in this encounter.    Follow-Up Instructions: Return in about 6 months (around  09/05/2024) for Osteoarthritis.   Nicholas Bari, MD  Note - This record has been created using Animal nutritionist.  Chart creation errors have been sought, but may not always  have been located. Such creation errors do not reflect on  the standard of medical care.

## 2024-02-27 ENCOUNTER — Telehealth: Payer: Self-pay | Admitting: Physical Medicine and Rehabilitation

## 2024-02-27 IMAGING — CT CT RENAL STONE PROTOCOL
2 of 4 series · 15 of 46 positions shown, 17 images · non-contrast
Comparison: None.

CLINICAL DATA: Flank pain, kidney stone suspected. Microscopic
hematuria.



[Series 2: stone study 5.0 br38 1 · axial · 0.71mm/px · z∈[-658,-293]mm · 12 of 84 slices shown, 14 images]
[im 7/84  soft-tissue]
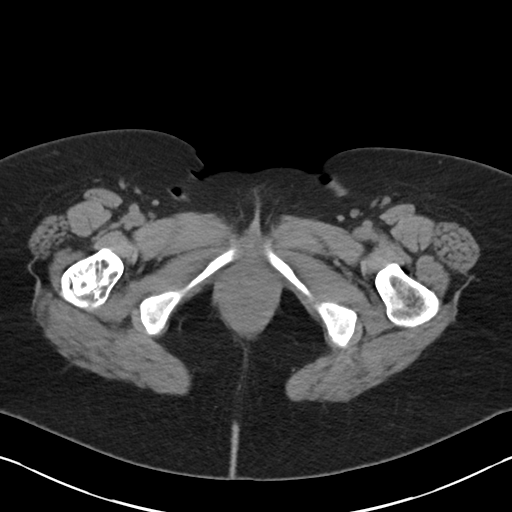
[im 7/84  bone]
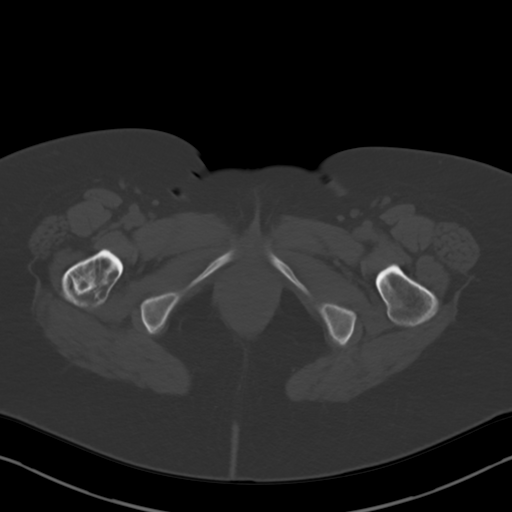
[im 14/84  soft-tissue]
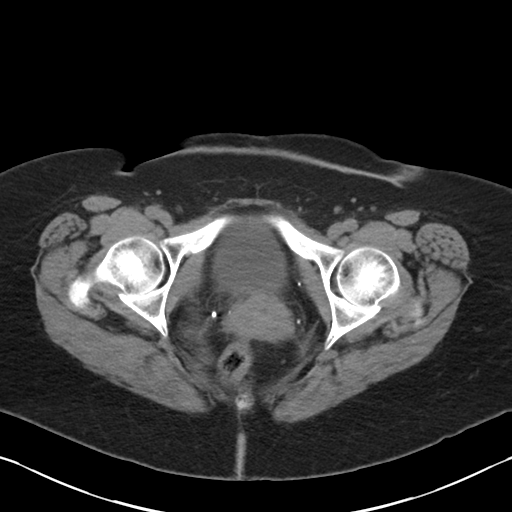
[im 20/84  soft-tissue]
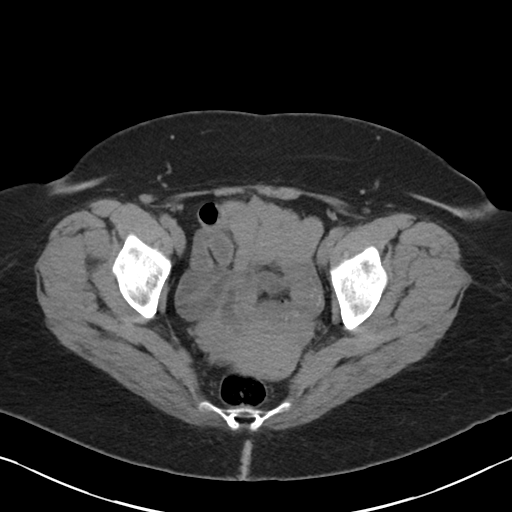
[im 27/84  soft-tissue]
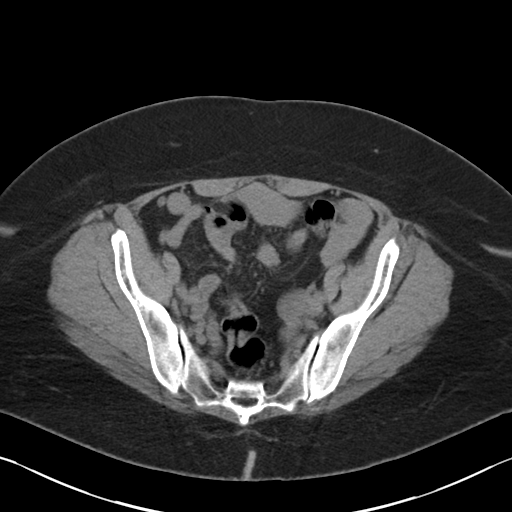
[im 34/84  soft-tissue]
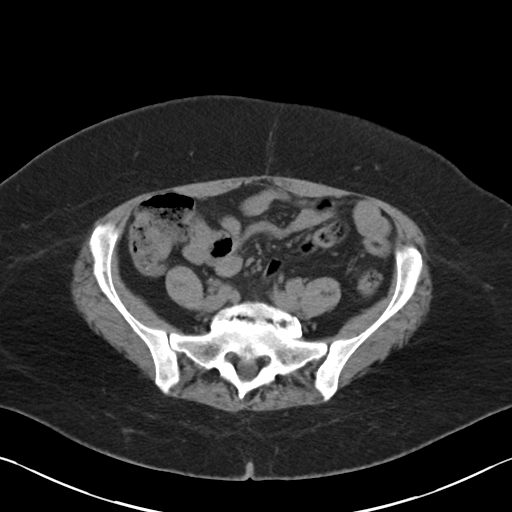
[im 40/84  soft-tissue]
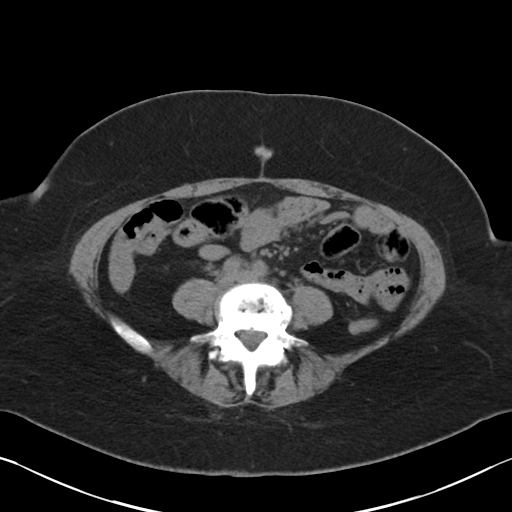
[im 47/84  soft-tissue]
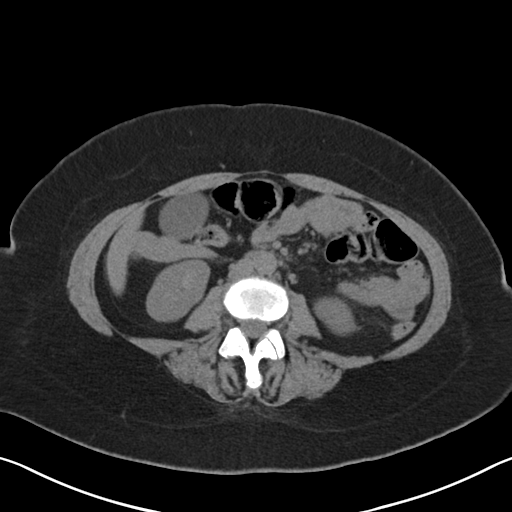
[im 54/84  soft-tissue]
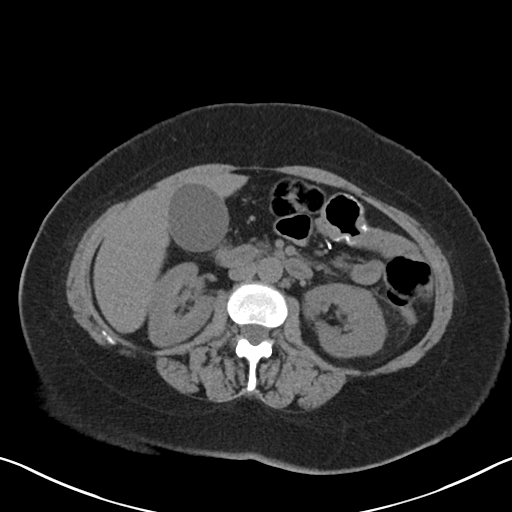
[im 60/84  soft-tissue]
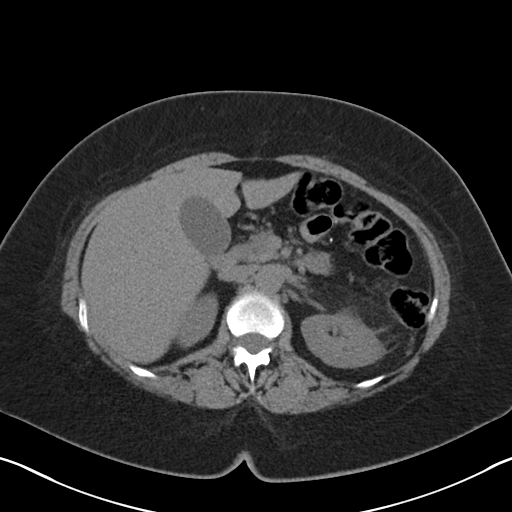
[im 60/84  bone]
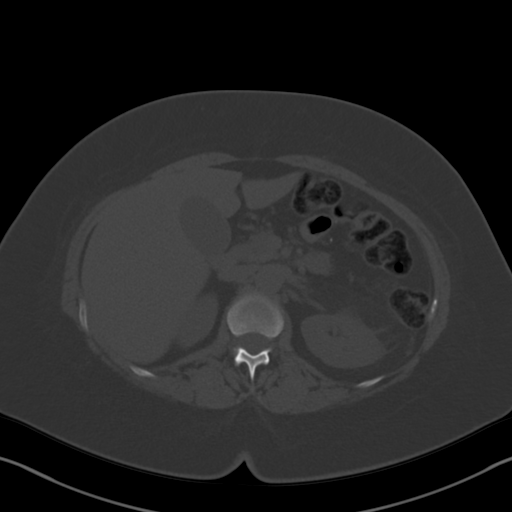
[im 67/84  soft-tissue]
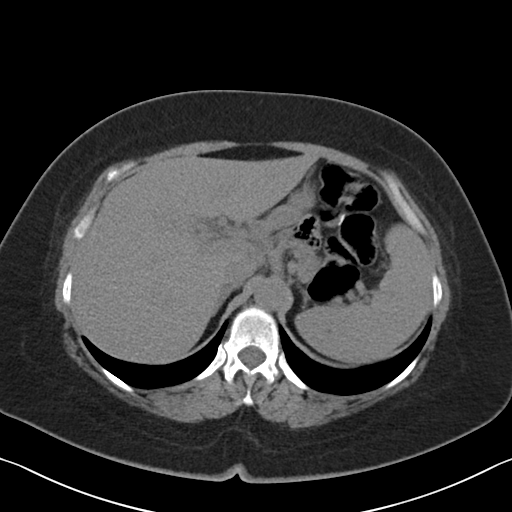
[im 74/84  soft-tissue]
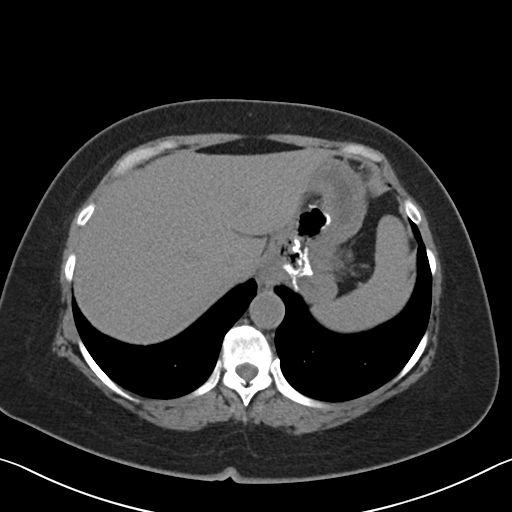
[im 80/84  soft-tissue]
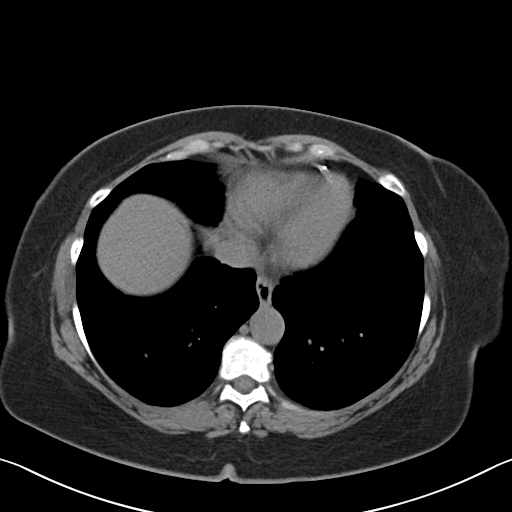

[Series 5: coronal soft tissue · coronal · 0.56mm/px · 3 of 71 slices shown]
[im 24/71  soft-tissue]
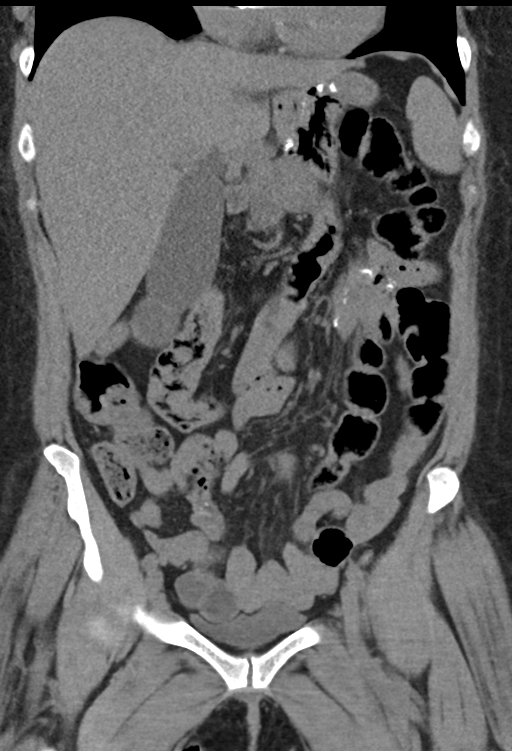
[im 32/71  soft-tissue]
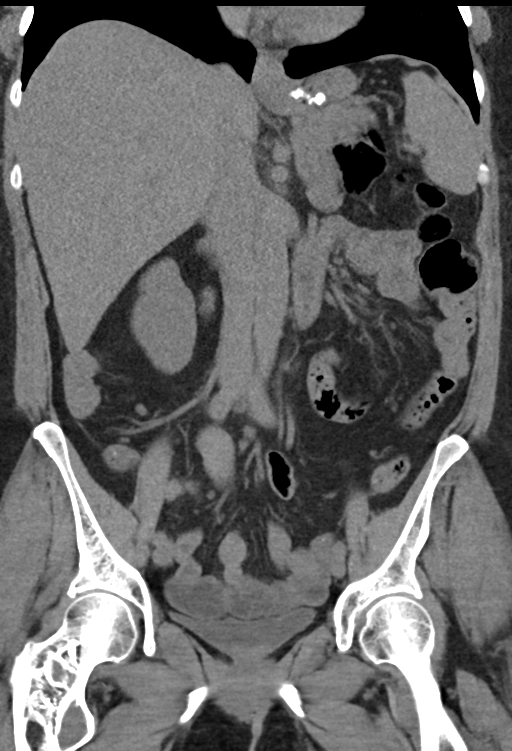
[im 39/71  soft-tissue]
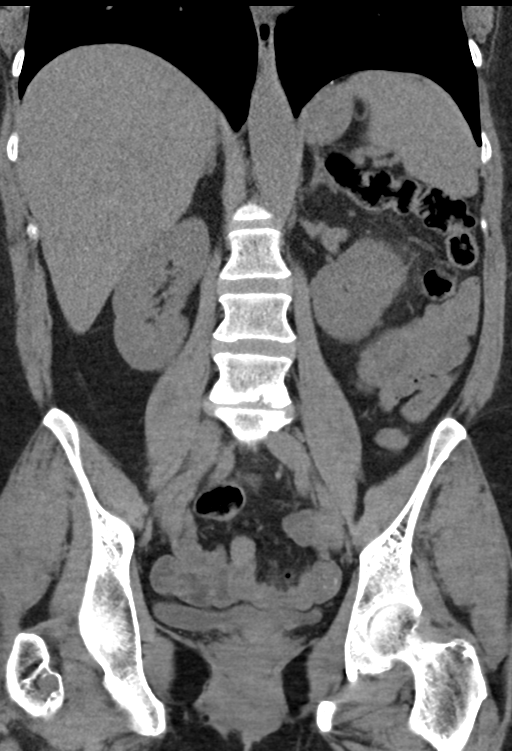

[15 of 46 positions shown; findings below may reference images not displayed]

FINDINGS: Lower chest: Lung bases are clear.  Pacer leads in the heart.

Hepatobiliary: No focal hepatic lesion. No biliary duct dilatation.
Common bile duct is normal.

Pancreas: Pancreas is normal. No ductal dilatation. No pancreatic
inflammation.

Spleen: Normal spleen

Adrenals/urinary tract: Adrenal glands normal.

No nephrolithiasis or ureterolithiasis. No obstructive uropathy. No
bladder calculi.

Stomach/Bowel: Postsurgical change of the stomach consistent
bariatric surgery. No small bowel obstruction or dilatation.
Appendix normal. The colon and rectosigmoid colon are normal.

Vascular/Lymphatic: Abdominal aorta is normal caliber. No periportal
or retroperitoneal adenopathy. No pelvic adenopathy.

Reproductive: Retroverted uterus.  No adnexal abnormality.

Other: No free fluid.

Musculoskeletal: Lucent lesion in the intertrochanteric RIGHT femur
with coarse trabeculation and narrow transition. Non expansile.
(Series 5/coronal/image 33).
IMPRESSION: 1. No nephrolithiasis, ureterolithiasis or obstructive uropathy.
2. Mo bladder calculi.
3. Normal appendix.
4. Benign-appearing lucent lesion in the proximal RIGHT femur.
Potential fibrous dysplasia. No further evaluation recommended
unless pain in RIGHT hip. If symptomatic , consider MRI of the RIGHT
hip.

## 2024-02-27 NOTE — Telephone Encounter (Signed)
 Patient called in requesting medication refill on pregabalin  (LYRICA ) 100 MG capsule  to be sent to [facility] on file

## 2024-02-28 ENCOUNTER — Encounter: Payer: Self-pay | Admitting: Oncology

## 2024-02-28 ENCOUNTER — Other Ambulatory Visit: Payer: Self-pay | Admitting: Physical Medicine and Rehabilitation

## 2024-03-01 ENCOUNTER — Telehealth: Payer: BLUE CROSS/BLUE SHIELD | Admitting: Adult Health

## 2024-03-05 NOTE — Telephone Encounter (Signed)
 Filled by Dr Sharl Davies

## 2024-03-06 ENCOUNTER — Ambulatory Visit: Payer: PRIVATE HEALTH INSURANCE | Attending: Rheumatology | Admitting: Rheumatology

## 2024-03-06 ENCOUNTER — Encounter: Payer: Self-pay | Admitting: Rheumatology

## 2024-03-06 VITALS — BP 126/89 | HR 98 | Resp 16 | Ht 60.0 in | Wt 211.8 lb

## 2024-03-06 DIAGNOSIS — M51369 Other intervertebral disc degeneration, lumbar region without mention of lumbar back pain or lower extremity pain: Secondary | ICD-10-CM

## 2024-03-06 DIAGNOSIS — M25512 Pain in left shoulder: Secondary | ICD-10-CM

## 2024-03-06 DIAGNOSIS — Z9884 Bariatric surgery status: Secondary | ICD-10-CM

## 2024-03-06 DIAGNOSIS — D75839 Thrombocytosis, unspecified: Secondary | ICD-10-CM

## 2024-03-06 DIAGNOSIS — M19072 Primary osteoarthritis, left ankle and foot: Secondary | ICD-10-CM

## 2024-03-06 DIAGNOSIS — M19041 Primary osteoarthritis, right hand: Secondary | ICD-10-CM | POA: Diagnosis not present

## 2024-03-06 DIAGNOSIS — M7701 Medial epicondylitis, right elbow: Secondary | ICD-10-CM

## 2024-03-06 DIAGNOSIS — I495 Sick sinus syndrome: Secondary | ICD-10-CM

## 2024-03-06 DIAGNOSIS — Z96651 Presence of right artificial knee joint: Secondary | ICD-10-CM

## 2024-03-06 DIAGNOSIS — M7062 Trochanteric bursitis, left hip: Secondary | ICD-10-CM

## 2024-03-06 DIAGNOSIS — M19042 Primary osteoarthritis, left hand: Secondary | ICD-10-CM

## 2024-03-06 DIAGNOSIS — M87051 Idiopathic aseptic necrosis of right femur: Secondary | ICD-10-CM

## 2024-03-06 DIAGNOSIS — M1712 Unilateral primary osteoarthritis, left knee: Secondary | ICD-10-CM

## 2024-03-06 DIAGNOSIS — M797 Fibromyalgia: Secondary | ICD-10-CM

## 2024-03-06 DIAGNOSIS — Z8659 Personal history of other mental and behavioral disorders: Secondary | ICD-10-CM

## 2024-03-06 DIAGNOSIS — R5383 Other fatigue: Secondary | ICD-10-CM

## 2024-03-06 DIAGNOSIS — G8929 Other chronic pain: Secondary | ICD-10-CM

## 2024-03-06 DIAGNOSIS — Z950 Presence of cardiac pacemaker: Secondary | ICD-10-CM

## 2024-03-06 DIAGNOSIS — M7061 Trochanteric bursitis, right hip: Secondary | ICD-10-CM | POA: Diagnosis not present

## 2024-03-06 DIAGNOSIS — M19071 Primary osteoarthritis, right ankle and foot: Secondary | ICD-10-CM

## 2024-03-06 NOTE — Patient Instructions (Signed)

## 2024-03-09 ENCOUNTER — Encounter: Payer: Self-pay | Admitting: Oncology

## 2024-03-10 ENCOUNTER — Other Ambulatory Visit: Payer: Self-pay | Admitting: Physical Medicine and Rehabilitation

## 2024-03-14 ENCOUNTER — Ambulatory Visit: Payer: Self-pay

## 2024-03-14 NOTE — Addendum Note (Signed)
 Addended by: Lott Rouleau A on: 03/14/2024 08:28 AM   Modules accepted: Orders

## 2024-03-14 NOTE — Telephone Encounter (Signed)
  Chief Complaint: requesting medication  Disposition: [] ED /[] Urgent Care (no appt availability in office) / [] Appointment(In office/virtual)/ []  [facility]/ [] Home Care/ [] Refused Recommended Disposition /[] [facility]/ [x]  Follow-up with PCP Additional Notes: Patient states she has to take an antibiotic an hour before dental cleanings due to her history of her knee replacement. Patient states she forgot to request it sooner from PCP and her dental cleaning is tomorrow at noon. No available appts or virtual visits with PCP.  Copied from CRM [id number]. Topic: Clinical - Red Word Triage >> Mar 14, 2024  2:13 PM Allyne Areola wrote: Red Word that prompted transfer to Nurse Triage: Patient is having a cleaning tomorrow for her yearly dental appointment, she has a complete knee replacement and is suppose to take an antibiotic prior for any dental work but did not request it with Adella Agee. She does not want to miss her appointment and would like to know if there is any way to order the medication. She is not aware of the name. Reason for Disposition  Prescription request for new medicine (not a refill)  Answer Assessment - Initial Assessment Questions 1. REASON FOR CALL or QUESTION: "What is your reason for calling today?" or "How can I best help you?" or "What question do you have that I can help answer?"     Patient states she was supposed to be on an antibiotic before dental cleanings, she has a dental cleaning scheduled for tomorrow at noon.   2. CALLER: Document the source of call. (e.g., laboratory, patient).     Patient.  Protocols used: PCP Call - No Triage-A-AH, Medication Refill and Renewal Call-A-AH

## 2024-03-14 NOTE — Progress Notes (Signed)
 Remote pacemaker transmission.

## 2024-03-15 ENCOUNTER — Other Ambulatory Visit: Payer: Self-pay | Admitting: Nurse Practitioner

## 2024-03-15 MED ORDER — DOXYCYCLINE HYCLATE 100 MG PO TABS: ORAL_TABLET | ORAL | 0 refills | Status: DC

## 2024-03-16 ENCOUNTER — Other Ambulatory Visit: Payer: Self-pay | Admitting: Nurse Practitioner

## 2024-03-16 DIAGNOSIS — F419 Anxiety disorder, unspecified: Secondary | ICD-10-CM

## 2024-03-19 ENCOUNTER — Telehealth: Payer: Self-pay | Admitting: Adult Health

## 2024-03-19 NOTE — Telephone Encounter (Signed)
 R/s my chart vv

## 2024-04-04 ENCOUNTER — Encounter: Payer: Self-pay | Admitting: Oncology

## 2024-04-04 ENCOUNTER — Other Ambulatory Visit: Payer: Self-pay | Admitting: Nurse Practitioner

## 2024-04-04 DIAGNOSIS — R21 Rash and other nonspecific skin eruption: Secondary | ICD-10-CM

## 2024-04-04 NOTE — Progress Notes (Unsigned)
 [facility] [address] [place] [place] [address] [phone]       OFFICE FOLLOW UP NOTE  Ms. Watson Hacking Date of Birth:  Aug 06, 1971 Medical Record Number:  [id number]    Primary neurologist: Dr. Omar Bibber Reason for visit: Initial CPAP follow-up    SUBJECTIVE:   CHIEF COMPLAINT:  No chief complaint on file.   Follow-up visit:  Prior visit: 08/11/2023  Brief HPI:   Mariann Palo is a 53 y.o. female who was evaluated by Dr. Omar Bibber on 09/27/2022 for concern of underlying sleep apnea with reports of snoring, excessive daytime somnolence and nocturia.  Completed sleep study 05/01/2023 which showed overall mild OSA with total AHI of 12/h and O2 nadir of 77%.  AutoPap initiated 05/13/2023. DME [employer].   At prior visit, compliance report below usage but gradually working on tolerance, noted improvement of tolerance recently since changing mask type.     Interval history:       Compliance report shows poor nightly and greater than 4-hour usage although optimal residual AHI with use. Feels like she is gradually doing better, was having issues with initial nasal pillow and since changed, feels like she is doing better. At times, will remove mask while sleeping as she can be a restless sleeper. Still struggles with insomnia at times.  ESS 14/24.          ROS:   14 system review of systems performed and negative with exception of those listed in HPI  PMH:  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    Mental disorder    Rheumatoid arthritis (HCC)    Sinus node dysfunction (HCC)    s/p pacemaker   Sleep apnea    Thrombocytosis     Vaginal Pap smear, abnormal     PSH:  Past Surgical History:  Procedure Laterality Date   CESAREAN SECTION  06/02/94   GASTRIC BYPASS  2010   285lbs at highest weight   HYSTEROSCOPY WITH D & C  08/22/2019   JOINT REPLACEMENT  05/2019   Revision Right knee   OTHER SURGICAL HISTORY  08/2019   Bilateral fallopian tube removal   PACEMAKER PLACEMENT  08/06/2020   REVISION TOTAL KNEE ARTHROPLASTY Right 06/01/2019   SHOULDER ARTHROSCOPY Left 2018   TUBAL LIGATION Bilateral 08/07/2019    Social History:  Social History   Socioeconomic History   Marital status: Legally Separated    Spouse name: Not on file   Number of children: 2   Years of education: Not on file   Highest education level: Some college, no degree  Occupational History   Not on file  Tobacco Use   Smoking status: Never    Passive exposure: Past   Smokeless tobacco: Never  Vaping Use   Vaping status: Never Used  Substance and Sexual Activity   Alcohol use: Not Currently   Drug use: Never   Sexual activity: Not Currently    Birth control/protection: Post-menopausal, Surgical    Comment: tubal  Other Topics Concern   Not on file  Social History Narrative   Separated  for 12 years,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   Caffiene coffee 2 cups.  Cokes 2 daily.   Social Drivers of Health   Financial Resource Strain: High Risk (02/06/2024)   Overall Financial Resource Strain (CARDIA)    Difficulty of Paying Living Expenses: Hard  Food Insecurity: Food Insecurity Present (02/06/2024)   Hunger Vital Sign    Worried About Running Out of Food in the Last Year: Sometimes true    Ran Out of Food in the Last Year: Sometimes true  Transportation Needs: No Transportation Needs (02/06/2024)   PRAPARE - Administrator, Civil Service (Medical): No    Lack of Transportation (Non-Medical): No  Physical Activity: Inactive (02/06/2024)   Exercise Vital Sign    Days of Exercise per  Week: 0 days    Minutes of Exercise per Session: 20 min  Stress: Stress Concern Present (02/06/2024)   [employer] of Occupational Health - Occupational Stress Questionnaire    Feeling of Stress : Rather much  Social Connections: Socially Isolated (02/06/2024)   Social Connection and Isolation Panel [NHANES]    Frequency of Communication with Friends and Family: Once a week    Frequency of Social Gatherings with Friends and Family: Once a week    Attends Religious Services: Never    Database administrator or Organizations: No    Attends Engineer, structural: Not on file    Marital Status: Separated  Intimate Partner Violence: Not At Risk (04/29/2023)   Humiliation, Afraid, Rape, and Kick questionnaire    Fear of Current or Ex-Partner: No    Emotionally Abused: No    Physically Abused: No    Sexually Abused: No    Family History:  Family History  Problem Relation Age of Onset   Hypertension Mother    Stroke Mother    Asthma Mother    COPD Mother    Early death Mother    Diabetes Father    Heart disease Father    Early death Father    Kidney disease Father    Hyperthyroidism Sister    COPD Sister    Asthma Sister    COPD Sister    Miscarriages / Stillbirths Sister    Obesity Brother    Hypertension Brother    Asthma Brother    Anxiety disorder Brother    Asthma Brother    Obesity Brother    Diabetes Brother    Asthma Brother    Asthma Brother    Polycystic ovary syndrome Daughter    Asthma Son    Obesity Son    ADD / ADHD Son    Colon cancer Cousin    Rectal cancer Neg Hx    Stomach cancer Neg Hx     Medications:   Current Outpatient Medications on File Prior to Visit  Medication Sig Dispense Refill   acetaminophen (TYLENOL) 500 MG tablet Take 500 mg by mouth every 6 (six) hours as needed.     busPIRone  (BUSPAR ) 10 MG tablet TAKE 1 TABLET BY MOUTH TWICE A DAY 180 tablet 1   Cholecalciferol (VITAMIN D3) 125 MCG (5000 UT) CAPS Take 2 capsules by  mouth in the morning and at bedtime. 2 in AM & 2 in PM. (10,000iu /2xday).     clonazePAM  (KLONOPIN ) 1 MG tablet TAKE 1 TABLET BY MOUTH TWICE A DAY AS NEEDED FOR ANXIETY 60 tablet 0   docusate sodium (COLACE) 100 MG capsule Take 100  mg by mouth daily as needed for mild constipation. (Patient not taking: Reported on 03/06/2024)     doxycycline  (VIBRA -TABS) 100 MG tablet Take 1 tablet by mouth once 30-60 minutes prior to dental procedure 1 tablet 0   DULoxetine  (CYMBALTA ) 60 MG capsule TAKE 1 CAPSULE BY MOUTH 2 TIMES DAILY. 180 capsule 1   EPINEPHrine  0.3 mg/0.3 mL IJ SOAJ injection Inject 0.3 mg into the muscle as needed for anaphylaxis. 1 each 2   ibuprofen (ADVIL) 200 MG tablet Take 400 mg by mouth every 6 (six) hours as needed.     meloxicam  (MOBIC ) 15 MG tablet TAKE 1 TABLET (15 MG TOTAL) BY MOUTH DAILY. 30 tablet 0   nystatin  cream (MYCOSTATIN ) APPLY TO AFFECTED AREA TWICE A DAY 15 g 0   omeprazole (PRILOSEC) 20 MG capsule Take 20 mg by mouth daily.     predniSONE  (STERAPRED UNI-PAK 21 TAB) 5 MG (21) TBPK tablet Take by mouth as directed.     pregabalin  (LYRICA ) 100 MG capsule TAKE 1 CAPSULE BY MOUTH TWICE A DAY 60 capsule 3   progesterone  (PROMETRIUM ) 200 MG capsule TAKE 1 DAILY FOR 21 DAYS EVERY MONTH 63 capsule 4   traMADol  (ULTRAM ) 50 MG tablet Take 1 tablet (50 mg total) by mouth 2 (two) times daily as needed for severe pain (pain score 7-10). 60 tablet 2   valACYclovir  (VALTREX ) 500 MG tablet TAKE 1 TABLET BY MOUTH TWICE A DAY (Patient taking differently: Take 500 mg by mouth as needed.) 180 tablet 3   vitamin B-12 (CYANOCOBALAMIN ) 500 MCG tablet Take 500 mcg by mouth daily.     No current facility-administered medications on file prior to visit.    Allergies:   Allergies  Allergen Reactions   Nickel Other (See Comments)    Other Reaction(s): Not available   Penicillins Other (See Comments)    Other Reaction(s): Not available   Shellfish Allergy    Sulfa Antibiotics Rash       OBJECTIVE:  Physical Exam  There were no vitals filed for this visit.  There is no height or weight on file to calculate BMI. No results found.   General: well developed, well nourished, very pleasant middle-age female, seated, in no evident distress Head: head normocephalic and atraumatic.   Neck: supple with no carotid or supraclavicular bruits Cardiovascular: regular rate and rhythm, no murmurs Musculoskeletal: no deformity Skin:  no rash/petichiae Vascular:  Normal pulses all extremities   Neurologic Exam Mental Status: Awake and fully alert. Oriented to place and time. Recent and remote memory intact. Attention span, concentration and fund of knowledge appropriate. Mood and affect appropriate.  Cranial Nerves: Pupils equal, briskly reactive to light. Extraocular movements full without nystagmus. Visual fields full to confrontation. Hearing intact. Facial sensation intact. Face, tongue, palate moves normally and symmetrically.  Motor: Normal bulk and tone. Normal strength in all tested extremity muscles Gait and Station: Arises from chair without difficulty. Stance is normal. Gait demonstrates normal stride length and balance without use of AD. Tandem walk and heel toe without difficulty.  Reflexes: 1+ and symmetric. Toes downgoing.         ASSESSMENT/PLAN: Izel Hochberg is a 53 y.o. year old female    OSA on CPAP : Compliance report shows low nightly usage.  Discussed ways to help improve tolerance and desensitization. May consider mask refitting or change of interface is she continues to remove mask while sleeping. Discussed importance of nightly usage with ensuring greater than 4 hours nightly for  optimal benefit and per insurance purposes.  Continue to follow with DME company for any needed supplies or CPAP related concerns     Follow up in 6 months via MyChart VV or call earlier if needed   CC:  PCP: Zorita Hiss, NP    I spent 25 minutes of  face-to-face and non-face-to-face time with patient.  This included previsit chart review, lab review, study review, order entry, electronic health record documentation, patient education and discussion regarding above diagnoses and treatment plan and answered all other questions to patient's satisfaction    Johny Nap, [facility]  [facility] [address] [place], [place] [address]  Phone [phone] Fax [phone] Note: This document was prepared with digital dictation and possible smart phrase technology. Any transcriptional errors that result from this process are unintentional.

## 2024-04-05 ENCOUNTER — Telehealth: Payer: Self-pay | Admitting: Adult Health

## 2024-04-05 ENCOUNTER — Encounter: Payer: Self-pay | Admitting: Adult Health

## 2024-04-05 DIAGNOSIS — G4733 Obstructive sleep apnea (adult) (pediatric): Secondary | ICD-10-CM

## 2024-04-09 NOTE — Progress Notes (Signed)
 Diana Giles D, CMA  Vergia Glasgow Bradley; Garcia, Patricia; Ziegler, Melissa; Tucker, Dolanda; Cain, [place] New orders have been placed for the above pt, DOB: 02/10/2071 Thanks

## 2024-04-27 ENCOUNTER — Other Ambulatory Visit

## 2024-04-27 ENCOUNTER — Other Ambulatory Visit: Payer: Self-pay | Admitting: Nurse Practitioner

## 2024-04-27 DIAGNOSIS — Z6841 Body Mass Index (BMI) 40.0 and over, adult: Secondary | ICD-10-CM

## 2024-04-27 DIAGNOSIS — E66813 Obesity, class 3: Secondary | ICD-10-CM

## 2024-04-27 DIAGNOSIS — Z136 Encounter for screening for cardiovascular disorders: Secondary | ICD-10-CM

## 2024-04-27 DIAGNOSIS — R300 Dysuria: Secondary | ICD-10-CM

## 2024-04-27 DIAGNOSIS — Z13220 Encounter for screening for lipoid disorders: Secondary | ICD-10-CM | POA: Diagnosis not present

## 2024-04-27 DIAGNOSIS — F419 Anxiety disorder, unspecified: Secondary | ICD-10-CM

## 2024-04-27 DIAGNOSIS — D75839 Thrombocytosis, unspecified: Secondary | ICD-10-CM

## 2024-04-27 LAB — COMPREHENSIVE METABOLIC PANEL WITH GFR
ALT: 12 U/L (ref 0–35)
AST: 17 U/L (ref 0–37)
Albumin: 3.8 g/dL (ref 3.5–5.2)
Alkaline Phosphatase: 102 U/L (ref 39–117)
BUN: 14 mg/dL (ref 6–23)
CO2: 26 meq/L (ref 19–32)
Calcium: 8.9 mg/dL (ref 8.4–10.5)
Chloride: 106 meq/L (ref 96–112)
Creatinine, Ser: 0.7 mg/dL (ref 0.40–1.20)
GFR: 97.59 mL/min (ref >60.00)
Glucose, Bld: 91 mg/dL (ref 70–99)
Potassium: 3.8 meq/L (ref 3.5–5.1)
Sodium: 140 meq/L (ref 135–145)
Total Bilirubin: 0.6 mg/dL (ref 0.2–1.2)
Total Protein: 6.7 g/dL (ref 6.0–8.3)

## 2024-04-27 LAB — CBC
HCT: 38.4 % (ref 36.0–46.0)
Hemoglobin: 12.7 g/dL (ref 12.0–15.0)
MCHC: 33.1 g/dL (ref 30.0–36.0)
MCV: 93 fl (ref 78.0–100.0)
Platelets: 342 10*3/uL (ref 150.0–400.0)
RBC: 4.12 Mil/uL (ref 3.87–5.11)
RDW: 12.8 % (ref 11.5–15.5)
WBC: 7.6 10*3/uL (ref 4.0–10.5)

## 2024-04-27 LAB — LIPID PANEL
Cholesterol: 141 mg/dL (ref 0–200)
HDL: 72.3 mg/dL (ref >39.00)
LDL Cholesterol: 62 mg/dL (ref 0–99)
NonHDL: 69.14
Total CHOL/HDL Ratio: 2
Triglycerides: 38 mg/dL (ref 0.0–149.0)
VLDL: 7.6 mg/dL (ref 0.0–40.0)

## 2024-04-27 LAB — HEMOGLOBIN A1C: Hgb A1c MFr Bld: 5.7 % (ref 4.6–6.5)

## 2024-04-27 LAB — TSH: TSH: 1.1 u[IU]/mL (ref 0.35–5.50)

## 2024-05-01 ENCOUNTER — Ambulatory Visit: Payer: Self-pay | Admitting: Family

## 2024-05-01 ENCOUNTER — Other Ambulatory Visit: Payer: Self-pay | Admitting: Physical Medicine and Rehabilitation

## 2024-05-02 ENCOUNTER — Encounter
Payer: PRIVATE HEALTH INSURANCE | Attending: Physical Medicine and Rehabilitation | Admitting: Physical Medicine and Rehabilitation

## 2024-05-02 VITALS — BP 106/74 | HR 82 | Ht 60.0 in | Wt 214.0 lb

## 2024-05-02 DIAGNOSIS — Z79891 Long term (current) use of opiate analgesic: Secondary | ICD-10-CM | POA: Insufficient documentation

## 2024-05-02 DIAGNOSIS — M47817 Spondylosis without myelopathy or radiculopathy, lumbosacral region: Secondary | ICD-10-CM | POA: Diagnosis present

## 2024-05-02 DIAGNOSIS — G894 Chronic pain syndrome: Secondary | ICD-10-CM | POA: Diagnosis not present

## 2024-05-02 DIAGNOSIS — G4700 Insomnia, unspecified: Secondary | ICD-10-CM | POA: Insufficient documentation

## 2024-05-02 DIAGNOSIS — F908 Attention-deficit hyperactivity disorder, other type: Secondary | ICD-10-CM | POA: Insufficient documentation

## 2024-05-02 DIAGNOSIS — Z560 Unemployment, unspecified: Secondary | ICD-10-CM | POA: Diagnosis present

## 2024-05-02 DIAGNOSIS — M797 Fibromyalgia: Secondary | ICD-10-CM | POA: Insufficient documentation

## 2024-05-02 DIAGNOSIS — R5382 Chronic fatigue, unspecified: Secondary | ICD-10-CM | POA: Insufficient documentation

## 2024-05-02 DIAGNOSIS — R413 Other amnesia: Secondary | ICD-10-CM | POA: Diagnosis present

## 2024-05-02 NOTE — Patient Instructions (Addendum)
 Continue Lyrica  and Tramadol  at current doses; I will refill for 3 months  Referral sent to psychiatry   Use your CPAP consistently  Continue your efforts to improve your applications and look for employment; you ar edoing great here!  Pick the same time to lay down every night, ideally between 8 and 10 PM.    Starting 1 hour before you want to go to sleep, turn off all television screens, phone screens, tablets, and computers.    Keep the lights low and perform only low stimulation activities, such as reading.    Only use your bedroom for sleep and sex. Avoid daytime naps and limit any time spent in your bed that is not dedicated to sleep.   You may also take 3 to 5 mg of over-the-counter melatonin approximately 1 hour before bedtime, or if you are prescribed a medication for sleep take it at this time.  Avoid alcohol, decongestants/pseudoephedrine, antihistamines, caffeine, and nicotine a few hours before bedtime, as these can reduce your quality of sleep.

## 2024-05-02 NOTE — Progress Notes (Signed)
 Subjective:    Patient ID: Diana Giles, female    DOB: 1971-02-08, 53 y.o.   MRN: [id number]  HPI  Diana Giles is a 53 y.o. year old female  who  has a past medical history of Allergy, Anemia, Anxiety, Avascular necrosis of hip (HCC), Blood transfusion without reported diagnosis, Bunion of left foot, COVID-19, DDD (degenerative disc disease), lumbar, Fibromyalgia, GERD (gastroesophageal reflux disease), Herpes simplex virus (HSV) infection, HPV in female, Mental disorder, Rheumatoid arthritis (HCC), Sinus node dysfunction (HCC), Sleep apnea, Thrombocytosis, and Vaginal Pap smear, abnormal.   . They are presenting to PM&R clinic as follow up for chronic pain from OA, FM, DDD, and low back pain with  sided lumbar radiculopathy (MBB 01/19/24) and SI joint pain (failed injections)   Plan from last visit:  Chronic pain syndrome Lumbosacral spondylosis without myelopathy Fibromyalgia Continue current medications and concentrate on getting through this tough time (getting let go from her job) first; she has a support system and is talking to her PCP about getting set up with behavioral health. No active SI, HI.    She will reach out to me through [facility] when she  needs a tramadol  refill   Continue medial branch blocks with Dr. Carilyn - she had excellent results from this, 100% back pain relief day-of injection and sustained relief >50% ongoing.    Will work on getting back in the pool once life stressors are better under control.    Follow up in 3-4 months   Dysuria Reach out to Dr. Elnor about concern for a UTI   Interval Hx:  - Therapies: It's getting better, not goig to lie I have been struggling. I deal with it by keeping a routine. She gets up, has a shower, makes her bed and this seems to help. She thinks she is gaining weight which does not help.    - Follow ups: Saw Dr. Beola about her knee, he says he thinks her pain is coming form her back at this point. Gave her  exercises and said to follow up with our office for ESIs.   Re: MBB - She has noticed the pain on the right is not as bad as it used to be, but is coming back; the left is actually getting pretty bad now.    - Falls: none   - DME: none   - Medications:  Lyrica  100 mg BID filled 5/28 - no issues - she notices if she misses it she gets a weird television static in her head; it goes away if she takes the lyrica .   Tramadol  filled 5/28 - no issues  Mobic  15 mg daily  - she avoids taking with with the tramadol  because someone told her to.  Cymbalta  - doing well   - Other concerns: Has put in many applications without a job offer. 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 She is at the tial end of her unemployment benefits, but her brother is living with her and helping, along with spousal support she thinks she will be ok.   She got an attorney and is getting together a case for retaliatory behavior by [facility].   She does not sleep at night, which makes her very tired during the day; makes her sleep a lot. She has not seen behavioral health or asked about a referral for this yet. She thinks she needs to resume her ADD medications - she manicly cleans in the middle  of the night. No SI/HI. She takes melatonin rarely in the middle for the night. She is using her CPAP machine again and the last 2 nights and does feel better with this.   Pain Inventory Average Pain 5 Pain Right Now 6 My pain is sharp, dull, stabbing, and aching  In the last 24 hours, has pain interfered with the following? General activity 2 Relation with others 3 Enjoyment of life 2 What TIME of day is your pain at its worst? morning  Sleep (in general) Poor  Pain is worse with: walking, bending, sitting, inactivity, standing, and some activites Pain improves with: heat/ice, pacing activities, and medication Relief from Meds: 5  Family History  Problem Relation Age of Onset    Hypertension Mother    Stroke Mother    Asthma Mother    COPD Mother    Early death Mother    Diabetes Father    Heart disease Father    Early death Father    Kidney disease Father    Hyperthyroidism Sister    COPD Sister    Asthma Sister    COPD Sister    Miscarriages / Stillbirths Sister    Obesity Brother    Hypertension Brother    Asthma Brother    Anxiety disorder Brother    Asthma Brother    Obesity Brother    Diabetes Brother    Asthma Brother    Asthma Brother    Polycystic ovary syndrome Daughter    Asthma Son    Obesity Son    ADD / ADHD Son    Colon cancer Cousin    Rectal cancer Neg Hx    Stomach cancer Neg Hx    Social History   Socioeconomic History   Marital status: Legally Separated    Spouse name: Not on file   Number of children: 2   Years of education: Not on file   Highest education level: Some college, no degree  Occupational History   Not on file  Tobacco Use   Smoking status: Never    Passive exposure: Past   Smokeless tobacco: Never  Vaping Use   Vaping status: Never Used  Substance and Sexual Activity   Alcohol use: Not Currently   Drug use: Never   Sexual activity: Not Currently    Birth control/protection: Post-menopausal, Surgical    Comment: tubal  Other Topics Concern   Not on file  Social History Narrative   Separated for 12 years,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   Caffiene coffee 2 cups.  Cokes 2 daily.   Social Drivers of Health   Financial Resource Strain: High Risk (02/06/2024)   Overall Financial Resource Strain (CARDIA)    Difficulty of Paying Living Expenses: Hard  Food Insecurity: Food Insecurity Present (02/06/2024)   Hunger Vital Sign    Worried About Running Out of Food in the Last Year: Sometimes true    Ran Out of Food in the Last Year: Sometimes true  Transportation Needs: No Transportation Needs (02/06/2024)   PRAPARE - Administrator, Civil Service  (Medical): No    Lack of Transportation (Non-Medical): No  Physical Activity: Inactive (02/06/2024)   Exercise Vital Sign    Days of Exercise per Week: 0 days    Minutes of Exercise per Session: 20 min  Stress: Stress Concern Present (02/06/2024)   [employer] of Occupational Health - Occupational Stress Questionnaire    Feeling of Stress :  Rather much  Social Connections: Socially Isolated (02/06/2024)   Social Connection and Isolation Panel    Frequency of Communication with Friends and Family: Once a week    Frequency of Social Gatherings with Friends and Family: Once a week    Attends Religious Services: Never    Database administrator or Organizations: No    Attends Engineer, structural: Not on file    Marital Status: Separated   Past Surgical History:  Procedure Laterality Date   CESAREAN SECTION  06/02/94   GASTRIC BYPASS  2010   285lbs at highest weight   HYSTEROSCOPY WITH D & C  08/22/2019   JOINT REPLACEMENT  05/2019   Revision Right knee   OTHER SURGICAL HISTORY  08/2019   Bilateral fallopian tube removal   PACEMAKER PLACEMENT  08/06/2020   REVISION TOTAL KNEE ARTHROPLASTY Right 06/01/2019   SHOULDER ARTHROSCOPY Left 2018   TUBAL LIGATION Bilateral 08/07/2019   Past Surgical History:  Procedure Laterality Date   CESAREAN SECTION  06/02/94   GASTRIC BYPASS  2010   285lbs at highest weight   HYSTEROSCOPY WITH D & C  08/22/2019   JOINT REPLACEMENT  05/2019   Revision Right knee   OTHER SURGICAL HISTORY  08/2019   Bilateral fallopian tube removal   PACEMAKER PLACEMENT  08/06/2020   REVISION TOTAL KNEE ARTHROPLASTY Right 06/01/2019   SHOULDER ARTHROSCOPY Left 2018   TUBAL LIGATION Bilateral 08/07/2019   Past Medical History:  Diagnosis Date   Allergy    Anemia    Anxiety    Avascular necrosis of hip (HCC)    Blood transfusion without reported diagnosis    Bunion of left foot    COVID-19    DDD (degenerative disc disease), lumbar     Fibromyalgia    GERD (gastroesophageal reflux disease)    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    Mental disorder    Rheumatoid arthritis (HCC)    Sinus node dysfunction (HCC)    s/p pacemaker   Sleep apnea    Thrombocytosis    Vaginal Pap smear, abnormal    BP 106/74   Pulse 82   Ht 5' (1.524 m)   Wt 214 lb (97.1 kg)   SpO2 96%   BMI 41.79 kg/m   Opioid Risk Score:   Fall Risk Score:  `1  Depression screen [facility] 2/9     02/08/2024    4:01 PM 01/19/2024   10:20 AM 11/18/2023    1:50 PM 10/14/2023   10:43 AM 07/15/2023   10:17 AM 04/29/2023    2:07 PM 04/15/2023    3:47 PM  Depression screen PHQ 2/9  Decreased Interest 2 0 1 0 2 1 0  Down, Depressed, Hopeless  0 1 1 1 1  0  PHQ - 2 Score 2 0 2 1 3 2  0  Altered sleeping 2    1    Tired, decreased energy 1    2    Change in appetite 2    0    Feeling bad or failure about yourself  1    0    Trouble concentrating 1    1    Moving slowly or fidgety/restless 0    1    Suicidal thoughts 0    0    PHQ-9 Score 9    8    Difficult doing work/chores Very difficult    Somewhat difficult  Review of Systems  Musculoskeletal:  Positive for back pain and neck pain.       Bilateral elbow pain Bilateral knee pain Back of right knee pain  All other systems reviewed and are negative.     Objective:   Physical Exam PE: Constitution: Appropriate appearance for age. No apparent distress  +Obese Resp: No respiratory distress. No accessory muscle usage. on RA Cardio: Well perfused appearance. No peripheral edema. Abdomen: Nondistended. Nontender. +BS Psych: Appropriate mood and affect. Much more hopeful than last exam.  Neuro: AAOx4. No apparent cognitive deficits    Neurologic Exam:   Sensory exam: + Decreased sensation in R lateral knee, extending along lateral calf. LLE intact.  Motor exam: strength 5/5 throughout bilateral upper extremities and bilateral lower extremities Coordination: Fine motor  coordination was normal.     Back MSK:  + Bilateral facet loading,  + TTP lumbar paraspinals - unchanged from prior exams, bilateral, L5/S1/S2 - L PSIS tenderness  + R TKR  -full AROM     Assessment & Plan:   Diana Giles is a 53 y.o. year old female  who  has a past medical history of Allergy, Anemia, Anxiety, Avascular necrosis of hip (HCC), Blood transfusion without reported diagnosis, Bunion of left foot, COVID-19, DDD (degenerative disc disease), lumbar, Fibromyalgia, GERD (gastroesophageal reflux disease), Herpes simplex virus (HSV) infection, HPV in female, Mental disorder, Rheumatoid arthritis (HCC), Sinus node dysfunction (HCC), Sleep apnea, Thrombocytosis, and Vaginal Pap smear, abnormal.   They are presenting to PM&R clinic as follow up for chronic pain from OA, FM, DDD, and low back pain with  sided lumbar radiculopathy (MBB 01/19/24) and SI joint pain (failed injections).   Chronic pain syndrome Encounter for long-term opiate analgesic use Fibromyalgia Lumbosacral spondylosis without myelopathy  Continue Lyrica  and Tramadol  at current doses; I will refill for 3 months Follow up with myself or Eunice in 3 months   Insomnia, unspecified type Memory difficulties Chronic fatigue -     Ambulatory referral to Psychiatry  Use your CPAP consistently  Continue your efforts to improve your applications and look for employment; you ar edoing great here!  Pick the same time to lay down every night, ideally between 8 and 10 PM.    Starting 1 hour before you want to go to sleep, turn off all television screens, phone screens, tablets, and computers.    Keep the lights low and perform only low stimulation activities, such as reading.    Only use your bedroom for sleep and sex. Avoid daytime naps and limit any time spent in your bed that is not dedicated to sleep.   You may also take 3 to 5 mg of over-the-counter melatonin approximately 1 hour before bedtime, or if you are  prescribed a medication for sleep take it at this time.  Avoid alcohol, decongestants/pseudoephedrine, antihistamines, caffeine, and nicotine a few hours before bedtime, as these can reduce your quality of sleep.   Recently unemployed Other specified attention deficit hyperactivity disorder (ADHD) -     Ambulatory referral to Psychiatry  Referral sent to psychiatry  Overall patient's outlook and mood is improving from last visit - working on training programs for medical coding/billing and looking for work - no concerns for SI/HI

## 2024-05-04 ENCOUNTER — Ambulatory Visit: Payer: Self-pay | Admitting: Cardiology

## 2024-05-04 ENCOUNTER — Ambulatory Visit: Payer: No Typology Code available for payment source

## 2024-05-04 DIAGNOSIS — I495 Sick sinus syndrome: Secondary | ICD-10-CM

## 2024-05-04 LAB — CUP PACEART REMOTE DEVICE CHECK
Battery Remaining Longevity: 139 mo
Battery Voltage: 3.02 V
Brady Statistic AP VP Percent: 0.03 %
Brady Statistic AP VS Percent: 4.62 %
Brady Statistic AS VP Percent: 0.04 %
Brady Statistic AS VS Percent: 95.31 %
Brady Statistic RA Percent Paced: 4.66 %
Brady Statistic RV Percent Paced: 0.07 %
Date Time Interrogation Session: 20250627040444
Implantable Lead Connection Status: 753985
Implantable Lead Connection Status: 753985
Implantable Lead Implant Date: 20210928
Implantable Lead Implant Date: 20210928
Implantable Lead Location: 753859
Implantable Lead Location: 753860
Implantable Lead Model: 4076
Implantable Lead Model: 4076
Implantable Pulse Generator Implant Date: 20210928
Lead Channel Impedance Value: 304 Ohm
Lead Channel Impedance Value: 361 Ohm
Lead Channel Impedance Value: 399 Ohm
Lead Channel Impedance Value: 456 Ohm
Lead Channel Pacing Threshold Amplitude: 0.5 V
Lead Channel Pacing Threshold Amplitude: 0.625 V
Lead Channel Pacing Threshold Pulse Width: 0.4 ms
Lead Channel Pacing Threshold Pulse Width: 0.4 ms
Lead Channel Sensing Intrinsic Amplitude: 2.25 mV
Lead Channel Sensing Intrinsic Amplitude: 2.25 mV
Lead Channel Sensing Intrinsic Amplitude: 6.625 mV
Lead Channel Sensing Intrinsic Amplitude: 6.625 mV
Lead Channel Setting Pacing Amplitude: 1.5 V
Lead Channel Setting Pacing Amplitude: 2 V
Lead Channel Setting Pacing Pulse Width: 0.4 ms
Lead Channel Setting Sensing Sensitivity: 0.9 mV
Zone Setting Status: 755011

## 2024-05-07 MED ORDER — PREGABALIN 100 MG PO CAPS: 100.0000 mg | ORAL_CAPSULE | Freq: Two times a day (BID) | ORAL | 2 refills | Status: AC

## 2024-05-07 MED ORDER — TRAMADOL HCL 50 MG PO TABS: 50.0000 mg | ORAL_TABLET | Freq: Two times a day (BID) | ORAL | 2 refills | Status: AC | PRN

## 2024-05-10 ENCOUNTER — Ambulatory Visit: Payer: PRIVATE HEALTH INSURANCE | Admitting: Physical Medicine & Rehabilitation

## 2024-05-10 ENCOUNTER — Other Ambulatory Visit: Payer: Self-pay | Admitting: *Deleted

## 2024-05-10 DIAGNOSIS — D509 Iron deficiency anemia, unspecified: Secondary | ICD-10-CM

## 2024-05-11 ENCOUNTER — Other Ambulatory Visit: Payer: Self-pay | Admitting: Nurse Practitioner

## 2024-05-11 DIAGNOSIS — F419 Anxiety disorder, unspecified: Secondary | ICD-10-CM

## 2024-05-14 ENCOUNTER — Encounter: Payer: Self-pay | Admitting: Oncology

## 2024-05-14 ENCOUNTER — Inpatient Hospital Stay: Payer: PRIVATE HEALTH INSURANCE | Attending: Oncology

## 2024-05-14 DIAGNOSIS — D509 Iron deficiency anemia, unspecified: Secondary | ICD-10-CM | POA: Insufficient documentation

## 2024-05-14 DIAGNOSIS — Z79899 Other long term (current) drug therapy: Secondary | ICD-10-CM | POA: Diagnosis not present

## 2024-05-14 LAB — CBC WITH DIFFERENTIAL/PLATELET
Abs Immature Granulocytes: 0.02 K/uL (ref 0.00–0.07)
Basophils Absolute: 0 K/uL (ref 0.0–0.1)
Basophils Relative: 0 %
Eosinophils Absolute: 0.2 K/uL (ref 0.0–0.5)
Eosinophils Relative: 2 %
HCT: 37.5 % (ref 36.0–46.0)
Hemoglobin: 12.4 g/dL (ref 12.0–15.0)
Immature Granulocytes: 0 %
Lymphocytes Relative: 32 %
Lymphs Abs: 2.2 K/uL (ref 0.7–4.0)
MCH: 31 pg (ref 26.0–34.0)
MCHC: 33.1 g/dL (ref 30.0–36.0)
MCV: 93.8 fL (ref 80.0–100.0)
Monocytes Absolute: 0.4 K/uL (ref 0.1–1.0)
Monocytes Relative: 5 %
Neutro Abs: 4.2 K/uL (ref 1.7–7.7)
Neutrophils Relative %: 61 %
Platelets: 331 K/uL (ref 150–400)
RBC: 4 MIL/uL (ref 3.87–5.11)
RDW: 12.1 % (ref 11.5–15.5)
WBC: 7.1 K/uL (ref 4.0–10.5)
nRBC: 0 % (ref 0.0–0.2)

## 2024-05-14 LAB — IRON AND TIBC
Iron: 82 ug/dL (ref 28–170)
Saturation Ratios: 25 % (ref 10.4–31.8)
TIBC: 326 ug/dL (ref 250–450)
UIBC: 244 ug/dL

## 2024-05-14 LAB — FERRITIN: Ferritin: 43 ng/mL (ref 11–307)

## 2024-05-16 ENCOUNTER — Inpatient Hospital Stay: Payer: PRIVATE HEALTH INSURANCE

## 2024-05-16 ENCOUNTER — Encounter: Payer: Self-pay | Admitting: Oncology

## 2024-05-16 ENCOUNTER — Inpatient Hospital Stay (HOSPITAL_BASED_OUTPATIENT_CLINIC_OR_DEPARTMENT_OTHER): Payer: PRIVATE HEALTH INSURANCE | Admitting: Oncology

## 2024-05-16 VITALS — BP 118/89 | HR 83 | Temp 97.2°F | Resp 16 | Wt 217.0 lb

## 2024-05-16 DIAGNOSIS — D509 Iron deficiency anemia, unspecified: Secondary | ICD-10-CM

## 2024-05-16 NOTE — Progress Notes (Signed)
 [facility]  [phone] [phone]) [phone]  ID: Cindia Stallion OB: 11-09-1970  MR#: [id number]  RDW#:[id number]  Patient Care Team: Elnor Lauraine BRAVO, NP as PCP - General (Nurse Practitioner) Cindie Ole DASEN, MD as PCP - Electrophysiology (Cardiology) Cindie Carlin POUR, DO as Consulting Physician (Internal Medicine) Dolphus Reiter, MD as Consulting Physician (Rheumatology) Jacobo Evalene PARAS, MD as Consulting Physician (Oncology)  CHIEF COMPLAINT: Iron  deficiency anemia.  INTERVAL HISTORY: Patient returns to clinic today for repeat laboratory, further evaluation, and consideration of additional IV Venofer .  She has increased anxiety and insomnia secondary to recently losing her job, but otherwise has felt well.  She does not complain of weakness or fatigue today.  She has no neurologic complaints.  She denies any recent fevers or illnesses.  She has a good appetite and denies weight loss.  She has no chest pain, shortness of breath, cough, or hemoptysis.  She denies any nausea, vomiting, constipation, or diarrhea.  She has no melena or hematochezia.  She has no urinary complaints.  Patient offers no further specific complaints today.  REVIEW OF SYSTEMS:   Review of Systems  Constitutional: Negative.  Negative for fever, malaise/fatigue and weight loss.  Respiratory: Negative.  Negative for cough, hemoptysis and shortness of breath.   Cardiovascular: Negative.  Negative for chest pain and leg swelling.  Gastrointestinal: Negative.  Negative for abdominal pain, blood in stool and melena.  Genitourinary: Negative.  Negative for hematuria.  Musculoskeletal: Negative.  Negative for back pain.  Skin: Negative.  Negative for rash.  Neurological: Negative.  Negative for dizziness, focal weakness, weakness and headaches.  Psychiatric/Behavioral: Negative.  The patient is not nervous/anxious.     As per HPI. Otherwise, a complete review of systems is  negative.  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    Mental disorder    Rheumatoid arthritis (HCC)    Sinus node dysfunction (HCC)    s/p pacemaker   Sleep apnea    Thrombocytosis    Vaginal Pap smear, abnormal     PAST SURGICAL HISTORY: Past Surgical History:  Procedure Laterality Date   CESAREAN SECTION  06/02/94   GASTRIC BYPASS  2010   285lbs at highest weight   HYSTEROSCOPY WITH D & C  08/22/2019   JOINT REPLACEMENT  05/2019   Revision Right knee   OTHER SURGICAL HISTORY  08/2019   Bilateral fallopian tube removal   PACEMAKER PLACEMENT  08/06/2020   REVISION TOTAL KNEE ARTHROPLASTY Right 06/01/2019   SHOULDER ARTHROSCOPY Left 2018   TUBAL LIGATION Bilateral 08/07/2019    FAMILY HISTORY: Family History  Problem Relation Age of Onset   Hypertension Mother    Stroke Mother    Asthma Mother    COPD Mother    Early death Mother    Diabetes Father    Heart disease Father    Early death Father    Kidney disease Father    Hyperthyroidism Sister    COPD Sister    Asthma Sister    COPD Sister    Miscarriages / [place] Sister  Obesity Brother    Hypertension Brother    Asthma Brother    Anxiety disorder Brother    Asthma Brother    Obesity Brother    Diabetes Brother    Asthma Brother    Asthma Brother    Polycystic ovary syndrome Daughter    Asthma Son    Obesity Son    ADD / ADHD Son    Colon cancer Cousin    Rectal cancer Neg Hx    Stomach cancer Neg Hx     ADVANCED DIRECTIVES (Y/N):  N  HEALTH MAINTENANCE: Social History   Tobacco Use   Smoking status: Never    Passive exposure: Past   Smokeless tobacco:  Never  Vaping Use   Vaping status: Never Used  Substance Use Topics   Alcohol use: Not Currently   Drug use: Never     Colonoscopy:  PAP:  Bone density:  Lipid panel:  Allergies  Allergen Reactions   Nickel Other (See Comments)    Other Reaction(s): Not available   Penicillins Other (See Comments)    Other Reaction(s): Not available   Shellfish Allergy    Sulfa Antibiotics Rash    Current Outpatient Medications  Medication Sig Dispense Refill   acetaminophen (TYLENOL) 500 MG tablet Take 500 mg by mouth every 6 (six) hours as needed.     busPIRone  (BUSPAR ) 10 MG tablet TAKE 1 TABLET BY MOUTH TWICE A DAY 180 tablet 1   Cholecalciferol (VITAMIN D3) 125 MCG (5000 UT) CAPS Take 2 capsules by mouth in the morning and at bedtime. 2 in AM & 2 in PM. (10,000iu /2xday).     clonazePAM  (KLONOPIN ) 1 MG tablet TAKE 1 TABLET BY MOUTH TWICE A DAY AS NEEDED FOR ANXIETY 60 tablet 0   doxycycline  (VIBRA -TABS) 100 MG tablet Take 1 tablet by mouth once 30-60 minutes prior to dental procedure 1 tablet 0   DULoxetine  (CYMBALTA ) 60 MG capsule TAKE 1 CAPSULE BY MOUTH 2 TIMES DAILY. 180 capsule 1   EPINEPHrine  0.3 mg/0.3 mL IJ SOAJ injection Inject 0.3 mg into the muscle as needed for anaphylaxis. 1 each 2   ibuprofen (ADVIL) 200 MG tablet Take 400 mg by mouth every 6 (six) hours as needed.     Melatonin 1 MG CAPS Take by mouth.     meloxicam  (MOBIC ) 15 MG tablet TAKE 1 TABLET (15 MG TOTAL) BY MOUTH DAILY. 30 tablet 0   nystatin  cream (MYCOSTATIN ) APPLY TO AFFECTED AREA TWICE A DAY 15 g 0   omeprazole (PRILOSEC) 20 MG capsule Take 20 mg by mouth daily.     predniSONE  (STERAPRED UNI-PAK 21 TAB) 5 MG (21) TBPK tablet Take by mouth as directed.     pregabalin  (LYRICA ) 100 MG capsule Take 1 capsule (100 mg total) by mouth 2 (two) times daily. 60 capsule 2   progesterone  (PROMETRIUM ) 200 MG capsule TAKE 1 DAILY FOR 21 DAYS EVERY MONTH 63 capsule 4   traMADol  (ULTRAM ) 50 MG tablet Take 1 tablet (50 mg  total) by mouth 2 (two) times daily as needed for severe pain (pain score 7-10). 60 tablet 2   valACYclovir  (VALTREX ) 500 MG tablet TAKE 1 TABLET BY MOUTH TWICE A DAY (Patient taking differently: Take 500 mg by mouth as needed.) 180 tablet 3   vitamin B-12 (CYANOCOBALAMIN ) 500 MCG tablet Take 500 mcg by mouth daily.     docusate sodium (COLACE) 100 MG capsule Take 100 mg by mouth daily as needed for mild constipation. (Patient not  taking: Reported on 05/16/2024)     No current facility-administered medications for this visit.    OBJECTIVE: Vitals:   05/16/24 1337  BP: 118/89  Pulse: 83  Resp: 16  Temp: (!) 97.2 F (36.2 C)  SpO2: 98%      Body mass index is 42.38 kg/m.    ECOG FS:0 - Asymptomatic   General: Well-developed, well-nourished, no acute distress. Eyes: Pink conjunctiva, anicteric sclera. HEENT: Normocephalic, moist mucous membranes. Lungs: No audible wheezing or coughing. Heart: Regular rate and rhythm. Abdomen: Soft, nontender, no obvious distention. Musculoskeletal: No edema, cyanosis, or clubbing. Neuro: Alert, answering all questions appropriately. Cranial nerves grossly intact. Skin: No rashes or petechiae noted. Psych: Normal affect.  LAB RESULTS:  Lab Results  Component Value Date   NA 140 04/27/2024   K 3.8 04/27/2024   CL 106 04/27/2024   CO2 26 04/27/2024   GLUCOSE 91 04/27/2024   BUN 14 04/27/2024   CREATININE 0.70 04/27/2024   CALCIUM 8.9 04/27/2024   PROT 6.7 04/27/2024   ALBUMIN 3.8 04/27/2024   AST 17 04/27/2024   ALT 12 04/27/2024   ALKPHOS 102 04/27/2024   BILITOT 0.6 04/27/2024   GFRNONAA >60 04/05/2023   GFRAA 121 04/09/2021    Lab Results  Component Value Date   WBC 7.1 05/14/2024   NEUTROABS 4.2 05/14/2024   HGB 12.4 05/14/2024   HCT 37.5 05/14/2024   MCV 93.8 05/14/2024   PLT 331 05/14/2024   Lab Results  Component Value Date   IRON  82 05/14/2024   TIBC 326 05/14/2024   IRONPCTSAT 25 05/14/2024   Lab Results   Component Value Date   FERRITIN 43 05/14/2024     STUDIES: CUP PACEART REMOTE DEVICE CHECK Result Date: 05/04/2024 PPM scheduled remote reviewed. Normal device function.  Presenting rhythm: AS-VS 1 NSVT event on 04/29/24 x 2 sec @ 173bpm.  Appears 1:1 AV conduction. Next remote 91 days. AB, CVRS   ASSESSMENT: Iron  deficiency anemia.  PLAN:    Iron  deficiency anemia: Likely secondary to poor absorption from history of gastric bypass.  Colonoscopy and EGD completed on June 03, 2023 did not reveal any significant pathology.  Patient's hemoglobin and iron  stores continue to be within normal limits.  She does not require additional Venofer  today.  She last received treatment on May 20, 2023.  No intervention is needed.  After discussion with the patient, it was agreed upon that no further follow-up is necessary.  Please refer patient back if there are any questions or concerns.  I spent a total of 20 minutes reviewing chart data, face-to-face evaluation with the patient, counseling and coordination of care as detailed above.   Patient expressed understanding and was in agreement with this plan. She also understands that She can call clinic at any time with any questions, concerns, or complaints.    Evalene JINNY Reusing, MD   05/16/2024 1:54 PM

## 2024-05-16 NOTE — Progress Notes (Signed)
 NO Venofer  today per MD note

## 2024-05-18 ENCOUNTER — Encounter
Payer: PRIVATE HEALTH INSURANCE | Attending: Physical Medicine and Rehabilitation | Admitting: Physical Medicine & Rehabilitation

## 2024-05-18 ENCOUNTER — Encounter: Payer: Self-pay | Admitting: Physical Medicine & Rehabilitation

## 2024-05-18 ENCOUNTER — Encounter: Payer: PRIVATE HEALTH INSURANCE | Admitting: Physical Medicine & Rehabilitation

## 2024-05-18 VITALS — BP 102/70 | HR 78 | Temp 97.7°F | Ht 60.0 in | Wt 210.0 lb

## 2024-05-18 DIAGNOSIS — M47817 Spondylosis without myelopathy or radiculopathy, lumbosacral region: Secondary | ICD-10-CM | POA: Insufficient documentation

## 2024-05-18 MED ORDER — LIDOCAINE HCL 1 % IJ SOLN
10.0000 mL | Freq: Once | INTRAMUSCULAR | Status: AC
  Administered 2024-05-18: 10 mL

## 2024-05-18 MED ORDER — IOHEXOL 180 MG/ML  SOLN
3.0000 mL | Freq: Once | INTRAMUSCULAR | Status: AC
  Administered 2024-05-18: 3 mL via ORAL

## 2024-05-18 MED ORDER — LIDOCAINE HCL (PF) 2 % IJ SOLN
6.0000 mL | Freq: Once | INTRAMUSCULAR | Status: AC
  Administered 2024-05-18: 6 mL

## 2024-05-18 NOTE — Progress Notes (Signed)
  PROCEDURE RECORD [facility]   Name: Diana Giles DOB:1971/08/30 MRN: [id number]  Date:05/18/2024  Physician: Prentice Compton, MD    Nurse/CMA: Jama, CMA  Allergies:  Allergies  Allergen Reactions   Nickel Other (See Comments)    Other Reaction(s): Not available   Penicillins Other (See Comments)    Other Reaction(s): Not available   Shellfish Allergy    Sulfa Antibiotics Rash    Consent Signed: Yes.    Is patient diabetic? No.  CBG today? .  Pregnant: No. LMP: No LMP recorded. Patient is postmenopausal. (age 53-55)  Anticoagulants: no Anti-inflammatory: yes (Meloxicam ) Antibiotics: no  Procedure: bilateral L3,4,5 Medial Branch Block  Position: Prone Start Time: 2:18 pm  End Time: 2:34 pm  Fluoro Time: 1:08  RN/CMA [name],CMA [name], CMA    Time 2:02 pm 2:50 pm    BP 102/70     Pulse 78     Respirations 16 16    O2 Sat 94     S/S 6 6    Pain Level 6/10      D/C home with sister, patient A & O X 3, D/C instructions reviewed, and sits independently.

## 2024-05-18 NOTE — Progress Notes (Signed)
 Discussed symptoms with the patient today.  She has had 1 set of right-sided L3-4-5 medial branch blocks and still has some continued relief with this but does have bilateral back pain that is at least moderate.  She has noticed more left-sided pain since she has had the right-sided MBB.  She has not completed 2 sets of MBB's in order to move onto radiofrequency neurotomy.  Will do bilateral L3-L4 medial branch and L5 dorsal ramus blocks today  Bilateral Lumbar L3, L4  medial branch blocks and L 5 dorsal ramus injection under fluoroscopic guidance  Indication: Lumbar pain which is not relieved by medication management or other conservative care and interfering with self-care and mobility.  Informed consent was obtained after describing risks and benefits of the procedure with the patient, this includes bleeding, infection, paralysis and medication side effects.  The patient wishes to proceed and has given written consent.  The patient was placed in prone position.  The lumbar area was marked and prepped with Betadine.  One mL of 1% lidocaine  was injected into each of 6 areas into the skin and subcutaneous tissue.  Then a 22-gauge 5in spinal needle was inserted targeting the junction of the left S1 superior articular process and sacral ala junction. Needle was advanced under fluoroscopic guidance.  Bone contact was made.Omnipaque  180 was injected x 0.5 mL demonstrating no intravascular uptake.  Then a solution  of 2% MPF lidocaine  was injected x 0.5 mL.  Then the left L5 superior articular process in transverse process junction was targeted.  Bone contact was made. Omnipaque  180 was injected x 0.5 mL demonstrating no intravascular uptake. Then a solution containing  2% MPF lidocaine  was injected x 0.5 mL.  Then the left L4 superior articular process in transverse process junction was targeted.  Bone contact was made. Omnipaque  180 was injected x 0.5 mL demonstrating no intravascular uptake.  Then a solution  containing2% MPF lidocaine  was injected x 0.5 mL.  This same procedure was performed on the right side using the same needle, technique and injectate.  Patient tolerated procedure well.  Post procedure instructions were given.   Lidocaine  1% with preservative multidose, 10ml no waste Lidocaine  2% MPF, 5ml bottle, 3ml used 2ml waste Omnipaque  180 1.5 ml used, 8.5 ml waste

## 2024-05-18 NOTE — Addendum Note (Signed)
 Addended by: JAMA FLEMING T on: 05/18/2024 04:28 PM   Modules accepted: Orders

## 2024-05-25 ENCOUNTER — Other Ambulatory Visit: Payer: Self-pay | Admitting: Obstetrics & Gynecology

## 2024-06-03 ENCOUNTER — Other Ambulatory Visit: Payer: Self-pay | Admitting: Physical Medicine and Rehabilitation

## 2024-06-04 ENCOUNTER — Other Ambulatory Visit: Payer: Self-pay | Admitting: Family

## 2024-06-04 DIAGNOSIS — F419 Anxiety disorder, unspecified: Secondary | ICD-10-CM

## 2024-06-06 ENCOUNTER — Encounter: Payer: Self-pay | Admitting: Nurse Practitioner

## 2024-06-06 DIAGNOSIS — F419 Anxiety disorder, unspecified: Secondary | ICD-10-CM

## 2024-06-07 MED ORDER — CLONAZEPAM 1 MG PO TABS: 1.0000 mg | ORAL_TABLET | Freq: Two times a day (BID) | ORAL | 0 refills | Status: DC | PRN

## 2024-06-10 ENCOUNTER — Other Ambulatory Visit: Payer: Self-pay | Admitting: Nurse Practitioner

## 2024-06-10 DIAGNOSIS — F419 Anxiety disorder, unspecified: Secondary | ICD-10-CM

## 2024-06-11 NOTE — Progress Notes (Deleted)
 Psychiatric Initial Adult Assessment   Patient Identification: Diana Giles MRN:  [id number] Date of Evaluation:  06/11/2024 Referral Source: PCP Chief Complaint:  No chief complaint on file.  Visit Diagnosis: No diagnosis found.   Assessment:  Diana Giles is a 53 y.o. female with a history of *** who presents in person to [facility] at [facility] for initial evaluation on 06/11/2024.    At initial evaluation patient reports ***  A number of assessments were performed during the evaluation today including  PHQ-9 which they scored a *** on, GAD-7 which they scored a *** on, and [place] suicide severity screening which showed ***.    Risk Assessment: A suicide and violence risk assessment was performed as part of this evaluation. There patient is deemed to be at chronic elevated risk for self-harm/suicide given the following factors: {SABSUICIDERISKFACTORS:29780}. These risk factors are mitigated by the following factors: {SABSUICIDEPROTECTIVEFACTORS:29779}. The patient is deemed to be at chronic elevated risk for violence given the following factors: {SABVIOLENCERISKFACTORS:29781}. These risk factors are mitigated by the following factors: {SABVIOLENCEPROTECTIVEFACTORS:29782}. There is no *** acute risk for suicide or violence at this time. The patient was educated about relevant modifiable risk factors including following recommendations for treatment of psychiatric illness and abstaining from substance abuse.  While future psychiatric events cannot be accurately predicted, the patient does not *** currently require  acute inpatient psychiatric care and does not *** currently meet [place]  involuntary commitment criteria.  Patient was given contact information for crisis resources, behavioral health clinic and was instructed to call 911 for emergencies.    Plan: # *** Past medication trials:  Status of problem: *** Interventions: -- ***  # *** Past  medication trials:  Status of problem: *** Interventions: -- ***  # *** Past medication trials:  Status of problem: *** Interventions: -- ***   History of Present Illness:  ***  Associated Signs/Symptoms: Depression Symptoms:  {DEPRESSION SYMPTOMS:20000} (Hypo) Manic Symptoms:  {BHH MANIC SYMPTOMS:22872} Anxiety Symptoms:  {BHH ANXIETY SYMPTOMS:22873} Psychotic Symptoms:  {BHH PSYCHOTIC SYMPTOMS:22874} PTSD Symptoms: {BHH PTSD SYMPTOMS:22875}  Past Psychiatric History:  Past psychiatric diagnoses: *** Psychiatric hospitalizations:*** Past suicide attempts: *** Hx of self harm: *** Hx of violence towards others: *** Prior psychiatric providers: *** Prior therapy: *** Access to firearms: ***  Prior medication trials: ***  Substance use: ***  Past Medical History:  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    Mental disorder    Rheumatoid arthritis (HCC)    Sinus node dysfunction (HCC)    s/p pacemaker   Sleep apnea    Thrombocytosis    Vaginal Pap smear, abnormal     Past Surgical History:  Procedure Laterality Date   CESAREAN SECTION  06/02/94   GASTRIC BYPASS  2010   285lbs at highest weight   HYSTEROSCOPY WITH D & C  08/22/2019   JOINT REPLACEMENT  05/2019   Revision Right knee   OTHER SURGICAL HISTORY  08/2019   Bilateral fallopian tube removal   PACEMAKER PLACEMENT  08/06/2020   REVISION TOTAL KNEE ARTHROPLASTY Right 06/01/2019  SHOULDER ARTHROSCOPY Left 2018   TUBAL LIGATION Bilateral 08/07/2019    Family Psychiatric History: ***  Family History:  Family History  Problem Relation Age of Onset   Hypertension Mother    Stroke Mother     Asthma Mother    COPD Mother    Early death Mother    Diabetes Father    Heart disease Father    Early death Father    Kidney disease Father    Hyperthyroidism Sister    COPD Sister    Asthma Sister    COPD Sister    Miscarriages / Stillbirths Sister    Obesity Brother    Hypertension Brother    Asthma Brother    Anxiety disorder Brother    Asthma Brother    Obesity Brother    Diabetes Brother    Asthma Brother    Asthma Brother    Polycystic ovary syndrome Daughter    Asthma Son    Obesity Son    ADD / ADHD Son    Colon cancer Cousin    Rectal cancer Neg Hx    Stomach cancer Neg Hx     Social History:   Social History   Socioeconomic History   Marital status: Legally Separated    Spouse name: Not on file   Number of children: 2   Years of education: Not on file   Highest education level: Some college, no degree  Occupational History   Not on file  Tobacco Use   Smoking status: Never    Passive exposure: Past   Smokeless tobacco: Never  Vaping Use   Vaping status: Never Used  Substance and Sexual Activity   Alcohol use: Not Currently   Drug use: Never   Sexual activity: Not Currently    Birth control/protection: Post-menopausal, Surgical    Comment: tubal  Other Topics Concern   Not on file  Social History Narrative   Separated for 12 years,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   Caffiene coffee 2 cups.  Cokes 2 daily.   Social Drivers of Health   Financial Resource Strain: High Risk (02/06/2024)   Overall Financial Resource Strain (CARDIA)    Difficulty of Paying Living Expenses: Hard  Food Insecurity: Food Insecurity Present (02/06/2024)   Hunger Vital Sign    Worried About Running Out of Food in the Last Year: Sometimes true    Ran Out of Food in the Last Year: Sometimes true  Transportation Needs: No Transportation Needs (02/06/2024)   PRAPARE - Administrator, Civil Service (Medical): No    Lack of  Transportation (Non-Medical): No  Physical Activity: Inactive (02/06/2024)   Exercise Vital Sign    Days of Exercise per Week: 0 days    Minutes of Exercise per Session: 20 min  Stress: Stress Concern Present (02/06/2024)   [employer] of Occupational Health - Occupational Stress Questionnaire    Feeling of Stress : Rather much  Social Connections: Socially Isolated (02/06/2024)   Social Connection and Isolation Panel    Frequency of Communication with Friends and Family: Once a week    Frequency of Social Gatherings with Friends and Family: Once a week    Attends Religious Services: Never    Database administrator or Organizations: No    Attends Engineer, structural: Not on file    Marital Status: Separated    Additional Social History: ***  Allergies:   Allergies  Allergen  Reactions   Nickel Other (See Comments)    Other Reaction(s): Not available   Penicillins Other (See Comments)    Other Reaction(s): Not available   Shellfish Allergy    Sulfa Antibiotics Rash    Metabolic Disorder Labs: Lab Results  Component Value Date   HGBA1C 5.7 04/27/2024   MPG 114 04/15/2022   MPG 114 04/09/2021   No results found for: PROLACTIN Lab Results  Component Value Date   CHOL 141 04/27/2024   TRIG 38.0 04/27/2024   HDL 72.30 04/27/2024   CHOLHDL 2 04/27/2024   VLDL 7.6 04/27/2024   LDLCALC 62 04/27/2024   LDLCALC 59 04/15/2023   Lab Results  Component Value Date   TSH 1.10 04/27/2024    Therapeutic Level Labs: No results found for: LITHIUM No results found for: CBMZ No results found for: VALPROATE  Current Medications: Current Outpatient Medications  Medication Sig Dispense Refill   acetaminophen (TYLENOL) 500 MG tablet Take 500 mg by mouth every 6 (six) hours as needed.     busPIRone  (BUSPAR ) 10 MG tablet TAKE 1 TABLET BY MOUTH TWICE A DAY 180 tablet 1   Cholecalciferol (VITAMIN D3) 125 MCG (5000 UT) CAPS Take 2 capsules by mouth in the morning  and at bedtime. 2 in AM & 2 in PM. (10,000iu /2xday).     clonazePAM  (KLONOPIN ) 1 MG tablet Take 1 tablet (1 mg total) by mouth 2 (two) times daily as needed for anxiety. 60 tablet 0   docusate sodium (COLACE) 100 MG capsule Take 100 mg by mouth daily as needed for mild constipation.     doxycycline  (VIBRA -TABS) 100 MG tablet Take 1 tablet by mouth once 30-60 minutes prior to dental procedure (Patient not taking: Reported on 05/18/2024) 1 tablet 0   DULoxetine  (CYMBALTA ) 60 MG capsule TAKE 1 CAPSULE BY MOUTH 2 TIMES DAILY. 180 capsule 1   EPINEPHrine  0.3 mg/0.3 mL IJ SOAJ injection Inject 0.3 mg into the muscle as needed for anaphylaxis. 1 each 2   ibuprofen (ADVIL) 200 MG tablet Take 400 mg by mouth every 6 (six) hours as needed.     Melatonin 1 MG CAPS Take by mouth.     meloxicam  (MOBIC ) 15 MG tablet TAKE 1 TABLET (15 MG TOTAL) BY MOUTH DAILY. 30 tablet 0   nystatin  cream (MYCOSTATIN ) APPLY TO AFFECTED AREA TWICE A DAY 15 g 0   omeprazole (PRILOSEC) 20 MG capsule Take 20 mg by mouth daily.     predniSONE  (STERAPRED UNI-PAK 21 TAB) 5 MG (21) TBPK tablet Take by mouth as directed.     pregabalin  (LYRICA ) 100 MG capsule Take 1 capsule (100 mg total) by mouth 2 (two) times daily. 60 capsule 2   progesterone  (PROMETRIUM ) 200 MG capsule TAKE 1 DAILY FOR 21 DAYS EVERY MONTH 63 capsule 4   traMADol  (ULTRAM ) 50 MG tablet Take 1 tablet (50 mg total) by mouth 2 (two) times daily as needed for severe pain (pain score 7-10). 60 tablet 2   valACYclovir  (VALTREX ) 500 MG tablet TAKE 1 TABLET BY MOUTH TWICE A DAY 180 tablet 3   vitamin B-12 (CYANOCOBALAMIN ) 500 MCG tablet Take 500 mcg by mouth daily.     No current facility-administered medications for this visit.    Musculoskeletal: Strength & Muscle Tone: {desc; muscle tone:32375} Gait & Station: {PE GAIT ED WJUO:77474} Patient leans: {Patient Leans:21022755}  Psychiatric Specialty Exam:  Psychiatric Specialty Exam: There were no vitals taken for  this visit.There is no height or weight on  file to calculate BMI. Review of Systems  General Appearance: {Appearance:22683}  Eye Contact:  {BHH EYE CONTACT:22684}  Speech:  {Speech:22685}  Volume:  {Volume (PAA):22686}  Mood:  {BHH MOOD:22306}  Affect:  {Affect (PAA):22687}  Thought Content: {Thought Content:22690}   Suicidal Thoughts:  {ST/HT (PAA):22692}  Homicidal Thoughts:  {ST/HT (PAA):22692}  Thought Process:  {Thought Process (PAA):22688}  Orientation:  {BHH ORIENTATION (PAA):22689}    Memory: {BHH MEMORY:22881}  Judgment:  {Judgement (PAA):22694}  Insight:  {Insight (PAA):22695}  Concentration:  {Concentration:21399}  Recall:  not formally assessed ***  Fund of Knowledge: {BHH GOOD/FAIR/POOR:22877}  Language: {BHH GOOD/FAIR/POOR:22877}  Psychomotor Activity:  {Psychomotor (PAA):22696}  Akathisia:  {BHH YES OR NO:22294}  AIMS (if indicated): {Desc; done/not:10129}  Assets:  {Assets (PAA):22698}  ADL's:  {BHH JIO'D:77709}  Cognition: {chl bhh cognition:304700322}  Sleep:  {BHH GOOD/FAIR/POOR:22877}    Screenings: GAD-7    Flowsheet Row Office Visit from 07/15/2023 in [facility] Visit from 03/13/2021 in [facility] for [facility] at [facility]  Total GAD-7 Score 5 11   PHQ2-9    Flowsheet Row Office Visit from 05/16/2024 in [facility] Of [place] Office Visit from 05/02/2024 in [facility] Office Visit from 02/08/2024 in [facility] at [facility] Office Visit from 01/19/2024 in [facility] Rehabilitation Office Visit from 11/18/2023 in [facility] Medicine [facility] Rehabilitation  PHQ-2 Total Score 0 2 2 0 2  PHQ-9 Total Score 0 14 9 -- --   Flowsheet Row ED from 04/05/2023 in [facility] Emergency Department at [facility] ED from 11/10/2022 in [facility] Department at [facility]  C-SSRS RISK CATEGORY No Risk No Risk     Collaboration of Care: {BH OP Collaboration of Care:21014065}  Patient/Guardian was advised Release of Information must be obtained prior to any record release in order to collaborate their care with an outside provider. Patient/Guardian was advised if they have not already done so to contact the registration department to sign all necessary forms in order for us  to release information regarding their care.   Consent: Patient/Guardian gives verbal consent for treatment and assignment of benefits for services provided during this visit. Patient/Guardian expressed understanding and agreed to proceed.   Jireh Elmore, MD 8/4/202511:23 AM

## 2024-06-25 ENCOUNTER — Ambulatory Visit (HOSPITAL_COMMUNITY): Payer: Self-pay

## 2024-07-06 ENCOUNTER — Other Ambulatory Visit: Payer: Self-pay | Admitting: Physical Medicine and Rehabilitation

## 2024-07-06 NOTE — Progress Notes (Signed)
 Remote pacemaker transmission.

## 2024-07-13 ENCOUNTER — Other Ambulatory Visit: Payer: Self-pay | Admitting: Internal Medicine

## 2024-07-13 DIAGNOSIS — F419 Anxiety disorder, unspecified: Secondary | ICD-10-CM

## 2024-07-19 MED ORDER — CLONAZEPAM 1 MG PO TABS: 1.0000 mg | ORAL_TABLET | Freq: Two times a day (BID) | ORAL | 0 refills | Status: DC | PRN

## 2024-07-19 NOTE — Addendum Note (Signed)
 Addended by: ELNOR DOMINO E on: 07/19/2024 02:20 PM   Modules accepted: Orders

## 2024-08-01 ENCOUNTER — Encounter
Payer: PRIVATE HEALTH INSURANCE | Attending: Physical Medicine and Rehabilitation | Admitting: Physical Medicine and Rehabilitation

## 2024-08-01 DIAGNOSIS — M47817 Spondylosis without myelopathy or radiculopathy, lumbosacral region: Secondary | ICD-10-CM | POA: Insufficient documentation

## 2024-08-03 ENCOUNTER — Ambulatory Visit

## 2024-08-03 DIAGNOSIS — I495 Sick sinus syndrome: Secondary | ICD-10-CM

## 2024-08-04 LAB — CUP PACEART REMOTE DEVICE CHECK
Battery Remaining Longevity: 136 mo
Battery Voltage: 3.02 V
Brady Statistic AP VP Percent: 0.04 %
Brady Statistic AP VS Percent: 5.56 %
Brady Statistic AS VP Percent: 0.04 %
Brady Statistic AS VS Percent: 94.36 %
Brady Statistic RA Percent Paced: 5.61 %
Brady Statistic RV Percent Paced: 0.07 %
Date Time Interrogation Session: 20250926041226
Implantable Lead Connection Status: 753985
Implantable Lead Connection Status: 753985
Implantable Lead Implant Date: 20210928
Implantable Lead Implant Date: 20210928
Implantable Lead Location: 753859
Implantable Lead Location: 753860
Implantable Lead Model: 4076
Implantable Lead Model: 4076
Implantable Pulse Generator Implant Date: 20210928
Lead Channel Impedance Value: 304 Ohm
Lead Channel Impedance Value: 361 Ohm
Lead Channel Impedance Value: 399 Ohm
Lead Channel Impedance Value: 456 Ohm
Lead Channel Pacing Threshold Amplitude: 0.5 V
Lead Channel Pacing Threshold Amplitude: 0.5 V
Lead Channel Pacing Threshold Pulse Width: 0.4 ms
Lead Channel Pacing Threshold Pulse Width: 0.4 ms
Lead Channel Sensing Intrinsic Amplitude: 0.5 mV
Lead Channel Sensing Intrinsic Amplitude: 0.5 mV
Lead Channel Sensing Intrinsic Amplitude: 6.25 mV
Lead Channel Sensing Intrinsic Amplitude: 6.25 mV
Lead Channel Setting Pacing Amplitude: 1.5 V
Lead Channel Setting Pacing Amplitude: 2 V
Lead Channel Setting Pacing Pulse Width: 0.4 ms
Lead Channel Setting Sensing Sensitivity: 0.9 mV
Zone Setting Status: 755011

## 2024-08-08 NOTE — Progress Notes (Signed)
 Remote PPM Transmission

## 2024-08-09 ENCOUNTER — Ambulatory Visit: Payer: PRIVATE HEALTH INSURANCE | Admitting: Nurse Practitioner

## 2024-08-10 ENCOUNTER — Ambulatory Visit: Payer: Self-pay | Admitting: Cardiology

## 2024-08-17 ENCOUNTER — Ambulatory Visit: Admitting: Nurse Practitioner

## 2024-08-17 ENCOUNTER — Other Ambulatory Visit: Payer: Self-pay

## 2024-08-17 ENCOUNTER — Encounter: Payer: Self-pay | Admitting: Oncology

## 2024-08-17 ENCOUNTER — Other Ambulatory Visit (HOSPITAL_COMMUNITY): Payer: Self-pay

## 2024-08-17 ENCOUNTER — Other Ambulatory Visit: Payer: Self-pay | Admitting: Nurse Practitioner

## 2024-08-17 ENCOUNTER — Telehealth: Payer: Self-pay | Admitting: Nurse Practitioner

## 2024-08-17 VITALS — BP 108/64 | HR 89 | Temp 98.0°F | Ht 60.0 in | Wt 214.2 lb

## 2024-08-17 DIAGNOSIS — E66813 Obesity, class 3: Secondary | ICD-10-CM | POA: Diagnosis not present

## 2024-08-17 DIAGNOSIS — K219 Gastro-esophageal reflux disease without esophagitis: Secondary | ICD-10-CM

## 2024-08-17 DIAGNOSIS — Z6841 Body Mass Index (BMI) 40.0 and over, adult: Secondary | ICD-10-CM

## 2024-08-17 DIAGNOSIS — R6889 Other general symptoms and signs: Secondary | ICD-10-CM | POA: Diagnosis not present

## 2024-08-17 DIAGNOSIS — F419 Anxiety disorder, unspecified: Secondary | ICD-10-CM

## 2024-08-17 LAB — COMPREHENSIVE METABOLIC PANEL WITH GFR
ALT: 9 U/L (ref 0–35)
AST: 16 U/L (ref 0–37)
Albumin: 4.2 g/dL (ref 3.5–5.2)
Alkaline Phosphatase: 116 U/L (ref 39–117)
BUN: 11 mg/dL (ref 6–23)
CO2: 28 meq/L (ref 19–32)
Calcium: 9.2 mg/dL (ref 8.4–10.5)
Chloride: 107 meq/L (ref 96–112)
Creatinine, Ser: 0.72 mg/dL (ref 0.40–1.20)
GFR: 94.14 mL/min (ref >60.00)
Glucose, Bld: 94 mg/dL (ref 70–99)
Potassium: 4.1 meq/L (ref 3.5–5.1)
Sodium: 142 meq/L (ref 135–145)
Total Bilirubin: 0.6 mg/dL (ref 0.2–1.2)
Total Protein: 7.1 g/dL (ref 6.0–8.3)

## 2024-08-17 LAB — CBC WITH DIFFERENTIAL/PLATELET
Basophils Absolute: 0 K/uL (ref 0.0–0.1)
Basophils Relative: 0.5 % (ref 0.0–3.0)
Eosinophils Absolute: 0.2 K/uL (ref 0.0–0.7)
Eosinophils Relative: 2.5 % (ref 0.0–5.0)
HCT: 39.3 % (ref 36.0–46.0)
Hemoglobin: 13 g/dL (ref 12.0–15.0)
Lymphocytes Relative: 30 % (ref 12.0–46.0)
Lymphs Abs: 2 K/uL (ref 0.7–4.0)
MCHC: 33.1 g/dL (ref 30.0–36.0)
MCV: 92 fl (ref 78.0–100.0)
Monocytes Absolute: 0.3 K/uL (ref 0.1–1.0)
Monocytes Relative: 4.4 % (ref 3.0–12.0)
Neutro Abs: 4.1 K/uL (ref 1.4–7.7)
Neutrophils Relative %: 62.6 % (ref 43.0–77.0)
Platelets: 367 K/uL (ref 150.0–400.0)
RBC: 4.27 Mil/uL (ref 3.87–5.11)
RDW: 13.1 % (ref 11.5–15.5)
WBC: 6.6 K/uL (ref 4.0–10.5)

## 2024-08-17 LAB — HEMOGLOBIN A1C: Hgb A1c MFr Bld: 5.8 % (ref 4.6–6.5)

## 2024-08-17 LAB — VITAMIN B12: Vitamin B-12: 225 pg/mL (ref 211–911)

## 2024-08-17 MED ORDER — WEGOVY 0.25 MG/0.5ML ~~LOC~~ SOAJ
0.2500 mg | SUBCUTANEOUS | 0 refills | Status: DC
  Filled 2024-08-17 – 2024-08-20 (×2): qty 2, 28d supply, fill #0

## 2024-08-17 MED ORDER — WEGOVY 0.25 MG/0.5ML ~~LOC~~ SOAJ: 0.2500 mg | SUBCUTANEOUS | 0 refills | Status: DC

## 2024-08-17 NOTE — Assessment & Plan Note (Signed)
 Class 3 severe obesity with BMI 41 Weight gain of 22 pounds since January 2022. Discussed GLP-1 receptor agonists, specifically Wegovy, for weight loss. Addressed muscle mass loss concerns and emphasized weight training. Discussed Wegovy side effects. Patient denies personal or family history of thyroid  cancer. No personal history of gallstones or pancreatitis - Send prescription for Wegovy 0.25mg /week - Request prior authorization for [facility]. - Encourage weight training to prevent muscle mass loss. - Monitor for side effects such as nausea, diarrhea, constipation, abdominal pain, and mental health changes.

## 2024-08-17 NOTE — Progress Notes (Signed)
 Established Patient Office Visit  Subjective   Patient ID: Diana Giles, female    DOB: April 30, 1971  Age: 53 y.o. MRN: [id number]  Chief Complaint  Patient presents with   Obesity    Discussed the use of AI scribe software for clinical note transcription with the patient, who gave verbal consent to proceed.  History of Present Illness Diana Giles is a 53 year old female who presents with weight gain and difficulty managing her weight.  Weight gain and obesity - Weight increased from 192 lbs in January 2022 to 214 lbs currently - Difficulty managing weight despite efforts - Pertinent past medical history: anemia, anxiety, arthritis, fibromyalgia, DDD, anxiety and depression, prediabetes, s/p gastric bypass  GERD -Omeprazole and tums use. Has daily heart burn. Concerned about forget fullness/dementia associated with prolonged PPI use      ROS: see HPI    Objective:     BP 108/64   Pulse 89   Temp 98 F (36.7 C) (Temporal)   Ht 5' (1.524 m)   Wt 214 lb 4 oz (97.2 kg)   SpO2 98%   BMI 41.84 kg/m  BP Readings from Last 3 Encounters:  08/17/24 108/64  05/18/24 102/70  05/16/24 118/89   Wt Readings from Last 3 Encounters:  08/17/24 214 lb 4 oz (97.2 kg)  05/18/24 210 lb (95.3 kg)  05/16/24 217 lb (98.4 kg)      Physical Exam Vitals reviewed.  Constitutional:      General: She is not in acute distress.    Appearance: Normal appearance.  HENT:     Head: Normocephalic and atraumatic.  Cardiovascular:     Rate and Rhythm: Normal rate and regular rhythm.     Pulses: Normal pulses.     Heart sounds: Normal heart sounds.  Pulmonary:     Effort: Pulmonary effort is normal.     Breath sounds: Normal breath sounds.  Skin:    General: Skin is warm and dry.  Neurological:     General: No focal deficit present.     Mental Status: She is alert and oriented to person, place, and time.  Psychiatric:        Mood and Affect: Mood normal.        Behavior:  Behavior normal.        Judgment: Judgment normal.      No results found for any visits on 08/17/24.    The 10-year ASCVD risk score (Arnett DK, et al., 2019) is: 0.8%    Assessment & Plan:   Problem List Items Addressed This Visit       Digestive   GERD (gastroesophageal reflux disease)   Gastroesophageal reflux disease  Frequent heartburn managed with long-term omeprazole. Discussed potential link between dementia and long-term PPI use.  Emphasized managing heartburn to prevent esophageal cancer. History of gastric bypass surgery may contribute to malabsorption and deficiencies. - Check B12 levels to assess for deficiency. - Instruct to take omeprazole every other day for two weeks, then attempt to stop. - If heartburn persists, try taking omeprazole every third day. - Consider switching to famotidine if omeprazole is not tolerated. - Advise on dietary modifications to reduce heartburn, such as avoiding caffeine, spicy foods, and chocolate.        Other   Class 3 severe obesity with body mass index (BMI) of 40.0 to 44.9 in adult [facility]) - Primary   Class 3 severe obesity with BMI 41 Weight gain of 22 pounds since January 2022.  Discussed GLP-1 receptor agonists, specifically Wegovy, for weight loss. Addressed muscle mass loss concerns and emphasized weight training. Discussed Wegovy side effects. Patient denies personal or family history of thyroid  cancer. No personal history of gallstones or pancreatitis - Send prescription for Wegovy 0.25mg /week - Request prior authorization for [facility]. - Encourage weight training to prevent muscle mass loss. - Monitor for side effects such as nausea, diarrhea, constipation, abdominal pain, and mental health changes.      Relevant Medications   semaglutide-weight management (WEGOVY) 0.25 MG/0.5ML SOAJ SQ injection   Other Relevant Orders   Vitamin B12   CBC with Differential/Platelet   Comprehensive metabolic panel with GFR   Hemoglobin  A1c   Zinc   Other Visit Diagnoses       Forgetfulness       Relevant Orders   Vitamin B12   CBC with Differential/Platelet   Comprehensive metabolic panel with GFR   Hemoglobin A1c   Zinc      Assessment and Plan Assessment & Plan Class 3 severe obesity with BMI 41 Weight gain of 22 pounds since January 2022. Discussed GLP-1 receptor agonists, specifically Wegovy, for weight loss. Addressed muscle mass loss concerns and emphasized weight training. Discussed Wegovy side effects. Patient denies personal or family history of thyroid  cancer. No personal history of gallstones or pancreatitis - Send prescription for Wegovy 0.25mg /week - Request prior authorization for [facility]. - Encourage weight training to prevent muscle mass loss. - Monitor for side effects such as nausea, diarrhea, constipation, abdominal pain, and mental health changes.  Gastroesophageal reflux disease and forgetfullness Frequent heartburn managed with long-term omeprazole. Discussed potential link between dementia and long-term PPI use.  Emphasized managing heartburn to prevent esophageal cancer. History of gastric bypass surgery may contribute to malabsorption and deficiencies. - Check B12 levels to assess for deficiency. - Instruct to take omeprazole every other day for two weeks, then attempt to stop. - If heartburn persists, try taking omeprazole every third day. - Consider switching to famotidine if omeprazole is not tolerated. - Advise on dietary modifications to reduce heartburn, such as avoiding caffeine, spicy foods, and chocolate.  Depression and anxiety disorders Exacerbated by financial stress and employment difficulties. On Buspar , Klonopin , and Duloxetine . Discussed potential mood impact of weight loss medications. - Continue current medications: Buspar , Klonopin , and Duloxetine . - Monitor for changes in mood or anxiety with the initiation of Wegovy.  Follow-Up Follow-up needed to assess  effectiveness and tolerability of the new treatment plan. - Schedule follow-up appointment in six weeks.   Return in about 6 weeks (around 09/28/2024) for F/U with Diana.    Diana FORBES Pereyra, NP

## 2024-08-17 NOTE — Assessment & Plan Note (Signed)
 Gastroesophageal reflux disease  Frequent heartburn managed with long-term omeprazole. Discussed potential link between dementia and long-term PPI use.  Emphasized managing heartburn to prevent esophageal cancer. History of gastric bypass surgery may contribute to malabsorption and deficiencies. - Check B12 levels to assess for deficiency. - Instruct to take omeprazole every other day for two weeks, then attempt to stop. - If heartburn persists, try taking omeprazole every third day. - Consider switching to famotidine if omeprazole is not tolerated. - Advise on dietary modifications to reduce heartburn, such as avoiding caffeine, spicy foods, and chocolate.

## 2024-08-17 NOTE — Telephone Encounter (Signed)
 Please start prior auth for wegovy for obesity BMI is 41.84

## 2024-08-18 ENCOUNTER — Ambulatory Visit: Payer: Self-pay | Admitting: Nurse Practitioner

## 2024-08-20 ENCOUNTER — Telehealth: Payer: Self-pay

## 2024-08-20 ENCOUNTER — Other Ambulatory Visit: Payer: Self-pay

## 2024-08-20 ENCOUNTER — Other Ambulatory Visit (HOSPITAL_COMMUNITY): Payer: Self-pay

## 2024-08-20 NOTE — Telephone Encounter (Signed)
 Pharmacy Patient Advocate Encounter  Received notification from [facility] that Prior Authorization for [facility] 0.25MG /0.5ML auto-injectors  has been DENIED.  See denial reason below. No denial letter attached in CMM. Will attach denial letter to Media tab once received.   PA #/Case ID/Reference #: [id number]

## 2024-08-20 NOTE — Telephone Encounter (Signed)
 Made pt aware of denial in different encounter.

## 2024-08-20 NOTE — Telephone Encounter (Signed)
 Pharmacy Patient Advocate Encounter   Received notification from Onbase that prior authorization for [facility] 0.25MG /0.5ML auto-injectors  is required/requested.   Insurance verification completed.   The patient is insured through [facility].   Per test claim: PA required; PA submitted to above mentioned insurance via Latent Key/confirmation #/EOC [place] Status is pending

## 2024-08-21 LAB — ZINC: Zinc: 66 ug/dL (ref 60–130)

## 2024-08-22 NOTE — Progress Notes (Signed)
 Office Visit Note  Patient: Diana Giles             Date of Birth: [birth year]           MRN: [id number]             PCP: Elnor Lauraine BRAVO, NP Referring: Elnor Lauraine BRAVO, NP Visit Date: 09/05/2024 Occupation: Data Unavailable  Subjective:  Left ankle pain   History of Present Illness: Archer Vise is a 53 y.o. female with history of osteoarthritis.  Patient remains on Cymbalta  and Lyrica  as prescribed.  She is been taking tramadol  as needed for pain relief but does not find tramadol  to be overly effective at managing her symptoms.  Patient states that she sprained her left ankle in August 2025.  Patient states that she completed 2 sessions of physical therapy but discontinued due to cost of co-pays.  Patient states that she tried to get an MRI approved but insurance has required that she completes a total of 6 sessions of physical therapy.  She will restart physical therapy in 09/18/2024.  Patient has continued to wear a brace on the left ankle.  She has difficulty standing for prolonged peers of time while at work due to severity of pain in the left ankle. Patient had a lower back injection in the summer which provided relief for about 1 month. She continues to experience intermittent myalgias and muscle tenderness due to fibromyalgia.  Activities of Daily Living:  Patient reports occasional morning stiffness  Patient Reports nocturnal pain.  Difficulty dressing/grooming: Reports Difficulty climbing stairs: Reports Difficulty getting out of chair: Reports Difficulty using hands for taps, buttons, cutlery, and/or writing: Reports  Review of Systems  Constitutional:  Positive for fatigue.  HENT:  Positive for mouth dryness. Negative for mouth sores.   Eyes:  Negative for dryness.  Respiratory:  Negative for shortness of breath.   Cardiovascular:  Negative for chest pain and palpitations.  Gastrointestinal:  Positive for constipation and diarrhea. Negative for blood in stool.   Endocrine: Negative for increased urination.  Genitourinary:  Negative for involuntary urination.  Musculoskeletal:  Positive for joint pain, gait problem, joint pain, joint swelling, myalgias, muscle weakness, morning stiffness, muscle tenderness and myalgias.  Skin:  Negative for color change, rash, hair loss and sensitivity to sunlight.  Allergic/Immunologic: Negative for susceptible to infections.  Neurological:  Negative for dizziness and headaches.  Hematological:  Negative for swollen glands.  Psychiatric/Behavioral:  Positive for depressed mood and sleep disturbance. The patient is nervous/anxious.     PMFS History:  Patient Active Problem List   Diagnosis Date Noted   GERD (gastroesophageal reflux disease) 08/17/2024   Recently unemployed 05/02/2024   Insomnia 05/02/2024   Memory difficulties 05/02/2024   Class 3 severe obesity with body mass index (BMI) of 40.0 to 44.9 in adult [facility]) 02/08/2024   Dysuria 02/03/2024   Lumbosacral spondylosis without myelopathy 01/30/2024   Fibroids 01/30/2024   Abdominal pain 10/14/2023   Chronic constipation 10/14/2023   History of anaphylaxis 07/15/2023   Rash 07/15/2023   Acute pain of right shoulder 07/15/2023   Iron  deficiency anemia, unspecified 04/29/2023   Anemia 04/15/2023   Abnormal TSH 04/15/2023   Generalized abdominal pain 03/25/2023   Change in nail appearance 03/25/2023   Diabetes mellitus screening 03/25/2023   Encounter for lipid screening for cardiovascular disease 03/25/2023   Encounter for hepatitis C screening test for low risk patient 03/25/2023   Encounter for long-term opiate analgesic use 03/25/2023   COVID-19  11/26/2022   Chronic bilateral low back pain with right-sided sciatica 09/15/2022   Arthritis pain 09/15/2022   Fibromyalgia 09/15/2022   Chronic pain syndrome 09/15/2022   Bilateral sacroiliitis 09/15/2022   Postmenopause 08/09/2022   Pacemaker 01/07/2021   Fatigue 11/13/2020   DDD (degenerative  disc disease), lumbar 11/13/2020   Avascular necrosis of bone of hip, right (HCC) 11/13/2020   Sinus node dysfunction (HCC) 11/13/2020   Thrombocytosis 11/13/2020   Anxiety 11/13/2020   Attention deficit hyperactivity disorder (ADHD) 11/13/2020    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    Mental disorder    Rheumatoid arthritis (HCC)    Sinus node dysfunction (HCC)    s/p pacemaker   Sleep apnea    Thrombocytosis    Vaginal Pap smear, abnormal     Family History  Problem Relation Age of Onset   Hypertension Mother    Stroke Mother    Asthma Mother    COPD Mother    Early death Mother    Diabetes Father    Heart disease Father    Early death Father    Kidney disease Father    Hyperthyroidism Sister    COPD Sister    Asthma Sister    COPD Sister    Miscarriages / Stillbirths Sister    Obesity Brother    Hypertension Brother    Asthma Brother    Anxiety disorder Brother    Asthma Brother    Obesity Brother    Diabetes Brother    Asthma Brother    Asthma Brother    Polycystic ovary syndrome Daughter    Asthma Son    Obesity Son    ADD / ADHD Son    Colon cancer Cousin    Rectal cancer Neg Hx    Stomach cancer Neg Hx    Past Surgical History:  Procedure Laterality Date   CESAREAN SECTION  06/02/94   GASTRIC BYPASS  2010   285lbs at highest weight   HYSTEROSCOPY WITH D & C  08/22/2019   JOINT REPLACEMENT  05/2019   Revision Right knee   OTHER SURGICAL HISTORY  08/2019   Bilateral fallopian tube removal   PACEMAKER PLACEMENT  08/06/2020   REVISION TOTAL KNEE ARTHROPLASTY Right 06/01/2019   SHOULDER ARTHROSCOPY Left 2018   TUBAL LIGATION Bilateral  08/07/2019   Social History   Tobacco Use   Smoking status: Never    Passive exposure: Past   Smokeless tobacco: Never  Vaping Use   Vaping status: Never Used  Substance Use Topics   Alcohol use: Not Currently   Drug use: Never   Social History   Social History Narrative   Separated for 12 years,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   Work for [employer].   Caffiene coffee 2 cups.  Cokes 2 daily.     Immunization History  Administered Date(s) Administered   Influenza, Seasonal, Injecte, Preservative Fre 07/15/2023   Influenza,inj,Quad PF,6+ Mos 08/05/2022   Influenza-Unspecified 07/31/2020, 07/07/2021   PFIZER(Purple Top)SARS-COV-2 Vaccination 02/09/2020, 03/01/2020, 09/18/2020   Tdap 12/17/2020   Zoster Recombinant(Shingrix)  09/23/2020     Objective: Vital Signs: BP 124/81   Pulse 76   Temp 97.7 F (36.5 C)   Resp 16   Ht 5' (1.524 m)   Wt 214 lb 9.6 oz (97.3 kg)   BMI 41.91 kg/m    Physical Exam Vitals and nursing note reviewed.  Constitutional:      Appearance: She is well-developed.  HENT:     Head: Normocephalic and atraumatic.  Eyes:     Conjunctiva/sclera: Conjunctivae normal.  Cardiovascular:     Rate and Rhythm: Normal rate and regular rhythm.     Heart sounds: Normal heart sounds.     Comments: Pacemaker Pulmonary:     Effort: Pulmonary effort is normal.     Breath sounds: Normal breath sounds.  Abdominal:     General: Bowel sounds are normal.     Palpations: Abdomen is soft.  Musculoskeletal:     Cervical back: Normal range of motion.  Lymphadenopathy:     Cervical: No cervical adenopathy.  Skin:    General: Skin is warm and dry.     Capillary Refill: Capillary refill takes less than 2 seconds.  Neurological:     Mental Status: She is alert and oriented to person, place, and time.  Psychiatric:        Behavior: Behavior normal.      Musculoskeletal Exam: Generalized hyperalgesia and positive tender points on exam.   Trapezius muscle tension tenderness bilaterally.  C-spine has limited range of motion with lateral rotation.  Difficult to assess lumbar range of motion while in seated position.  Shoulder joints, elbow joints, wrist joints, MCPs, PIPs, DIPs have good range of motion with no synovitis.  Complete fist formation bilaterally.  Thickening and tenderness of the DIP joints of both fifth digits. Thickening of the DIP of the left index.  Hip joints able to assess in seated position.  Right knee replacement has good range of motion.  Crepitus with range of motion of the left knee.  Left ankle is in a brace.  CDAI Exam: CDAI Score: -- Patient Global: --; Provider Global: -- Swollen: --; Tender: -- Joint Exam 09/05/2024   No joint exam has been documented for this visit   There is currently no information documented on the homunculus. Go to the Rheumatology activity and complete the homunculus joint exam.  Investigation: No additional findings.  Imaging: No results found.   Recent Labs: Lab Results  Component Value Date   WBC 6.6 08/17/2024   HGB 13.0 08/17/2024   PLT 367.0 08/17/2024   NA 142 08/17/2024   K 4.1 08/17/2024   CL 107 08/17/2024   CO2 28 08/17/2024   GLUCOSE 94 08/17/2024   BUN 11 08/17/2024   CREATININE 0.72 08/17/2024   BILITOT 0.6 08/17/2024   ALKPHOS 116 08/17/2024   AST 16 08/17/2024   ALT 9 08/17/2024   PROT 7.1 08/17/2024   ALBUMIN 4.2 08/17/2024   CALCIUM 9.2 08/17/2024   GFRAA 121 04/09/2021    Speciality Comments: No specialty comments available.  Procedures:  No procedures performed Allergies: Nickel, Penicillins, Shellfish allergy, and Sulfa antibiotics   Assessment / Plan:     Visit Diagnoses: Chronic left shoulder pain - XR obtained in the past were unremarkable.  Patient underwent left shoulder arthroscopic surgery 2018.  She continues to have trapezius muscle tension and tenderness bilaterally.  Tenderness over the deltoid insertion site affecting  both shoulders noted.  Most of her discomfort seems to be due to underlying  myofascial pain at this time.  Medial epicondylitis of elbow, right: Tenderness upon palpation.  Primary osteoarthritis of both hands: Mild PIP and DIP prominence consistent with osteoarthritis of both hands.  She experiences discomfort in the PIP and DIP joint of bilateral fifth digits. Discussed importance of joint protection and muscle strengthening.  Discussed the use of arthritis compression gloves.  Discussed that she can soak her hands in Epsom salt baths or use paraffin wax as needed.  Trochanteric bursitis of both hips: Intermittent discomfort.  Avascular necrosis of bone of hip, right (HCC) - 2015 and revision in 2020.  She continues to have chronic pain.  She completed PT.  Hx of total knee replacement, right: She has good range of motion of the right knee replacement on examination.  No effusion noted.  Primary osteoarthritis of left knee: Crepitus with range of motion of the left knee noted.  No warmth or effusion noted.  Primary osteoarthritis of both feet: Patient is currently wearing a left ankle brace.  She sprained the left ankle in August 2025 and has had difficulty standing and walking since then.  She tried 2 sessions of physical therapy but had to discontinue due to cost.  She tried to get an MRI approved but insurance has declined.  She will require 4 more sessions of physical therapy which she will be starting on 09/18/2024.  Degeneration of intervertebral disc of lumbar region without discogenic back pain or lower extremity pain: Chronic pain.  Patient had a lower back injection this summer which provided relief for about 1 month.  Fibromyalgia - She continues to have generalized hyperalgesia and positive tender points on exam.  She has intermittent flares of fibromyalgia triggered by weather changes and stress levels.  She remains on Cymbalta  as prescribed and is taking Lyrica  100 mg twice daily.   She takes tramadol  every 6 hours as needed for pain relief.  She does not find tramadol  to be overly effective at managing her pain levels.  Could consider increasing the dose of Lyrica  which she plans on discussing with Dr. Emeline at her upcoming appointment.   Other fatigue: Chronic, stable.  Discussed importance of regular exercise and good sleep hygiene.  Other medical conditions are listed as follows:  Sinus node dysfunction (HCC)  Pacemaker  Thrombocytosis  History of gastric bypass  History of anxiety  History of ADHD  Orders: No orders of the defined types were placed in this encounter.  No orders of the defined types were placed in this encounter.    Follow-Up Instructions: Return in about 6 months (around 03/06/2025) for Osteoarthritis.   Waddell CHRISTELLA Craze, PA-C  Note - This record has been created using Dragon software.  Chart creation errors have been sought, but may not always  have been located. Such creation errors do not reflect on  the standard of medical care.

## 2024-08-23 ENCOUNTER — Other Ambulatory Visit: Payer: Self-pay | Admitting: Nurse Practitioner

## 2024-08-23 DIAGNOSIS — E538 Deficiency of other specified B group vitamins: Secondary | ICD-10-CM

## 2024-08-23 MED ORDER — CYANOCOBALAMIN 1000 MCG/ML IJ SOLN: INTRAMUSCULAR | 1 refills | Status: AC

## 2024-08-23 MED ORDER — BD SAFETYGLIDE SYRINGE/NEEDLE 25G X 1" 3 ML MISC
0 refills | Status: AC
Start: 2024-08-23 — End: ?

## 2024-08-29 ENCOUNTER — Encounter
Payer: PRIVATE HEALTH INSURANCE | Attending: Physical Medicine and Rehabilitation | Admitting: Physical Medicine and Rehabilitation

## 2024-08-29 DIAGNOSIS — M47817 Spondylosis without myelopathy or radiculopathy, lumbosacral region: Secondary | ICD-10-CM | POA: Insufficient documentation

## 2024-09-05 ENCOUNTER — Ambulatory Visit: Payer: PRIVATE HEALTH INSURANCE | Attending: Physician Assistant | Admitting: Physician Assistant

## 2024-09-05 ENCOUNTER — Encounter: Payer: Self-pay | Admitting: Physician Assistant

## 2024-09-05 VITALS — BP 124/81 | HR 76 | Temp 97.7°F | Resp 16 | Ht 60.0 in | Wt 214.6 lb

## 2024-09-05 DIAGNOSIS — M51369 Other intervertebral disc degeneration, lumbar region without mention of lumbar back pain or lower extremity pain: Secondary | ICD-10-CM

## 2024-09-05 DIAGNOSIS — M7061 Trochanteric bursitis, right hip: Secondary | ICD-10-CM | POA: Diagnosis not present

## 2024-09-05 DIAGNOSIS — M7062 Trochanteric bursitis, left hip: Secondary | ICD-10-CM

## 2024-09-05 DIAGNOSIS — M19042 Primary osteoarthritis, left hand: Secondary | ICD-10-CM

## 2024-09-05 DIAGNOSIS — M25512 Pain in left shoulder: Secondary | ICD-10-CM

## 2024-09-05 DIAGNOSIS — M19041 Primary osteoarthritis, right hand: Secondary | ICD-10-CM | POA: Diagnosis not present

## 2024-09-05 DIAGNOSIS — D75839 Thrombocytosis, unspecified: Secondary | ICD-10-CM

## 2024-09-05 DIAGNOSIS — M7701 Medial epicondylitis, right elbow: Secondary | ICD-10-CM | POA: Diagnosis not present

## 2024-09-05 DIAGNOSIS — M87051 Idiopathic aseptic necrosis of right femur: Secondary | ICD-10-CM

## 2024-09-05 DIAGNOSIS — I495 Sick sinus syndrome: Secondary | ICD-10-CM

## 2024-09-05 DIAGNOSIS — Z9884 Bariatric surgery status: Secondary | ICD-10-CM

## 2024-09-05 DIAGNOSIS — Z950 Presence of cardiac pacemaker: Secondary | ICD-10-CM

## 2024-09-05 DIAGNOSIS — Z96651 Presence of right artificial knee joint: Secondary | ICD-10-CM

## 2024-09-05 DIAGNOSIS — R5383 Other fatigue: Secondary | ICD-10-CM

## 2024-09-05 DIAGNOSIS — M19071 Primary osteoarthritis, right ankle and foot: Secondary | ICD-10-CM

## 2024-09-05 DIAGNOSIS — M797 Fibromyalgia: Secondary | ICD-10-CM

## 2024-09-05 DIAGNOSIS — G8929 Other chronic pain: Secondary | ICD-10-CM

## 2024-09-05 DIAGNOSIS — M19072 Primary osteoarthritis, left ankle and foot: Secondary | ICD-10-CM

## 2024-09-05 DIAGNOSIS — M1712 Unilateral primary osteoarthritis, left knee: Secondary | ICD-10-CM

## 2024-09-05 DIAGNOSIS — Z8659 Personal history of other mental and behavioral disorders: Secondary | ICD-10-CM

## 2024-09-07 ENCOUNTER — Other Ambulatory Visit: Payer: Self-pay | Admitting: Medical Genetics

## 2024-09-07 DIAGNOSIS — Z006 Encounter for examination for normal comparison and control in clinical research program: Secondary | ICD-10-CM

## 2024-09-10 ENCOUNTER — Other Ambulatory Visit: Payer: Self-pay | Admitting: Physical Medicine and Rehabilitation

## 2024-09-10 ENCOUNTER — Other Ambulatory Visit: Payer: Self-pay | Admitting: Nurse Practitioner

## 2024-09-10 DIAGNOSIS — R21 Rash and other nonspecific skin eruption: Secondary | ICD-10-CM

## 2024-09-27 ENCOUNTER — Encounter: Payer: Self-pay | Admitting: Oncology

## 2024-09-28 ENCOUNTER — Ambulatory Visit: Admitting: Nurse Practitioner

## 2024-10-02 ENCOUNTER — Other Ambulatory Visit: Payer: Self-pay | Admitting: Nurse Practitioner

## 2024-10-02 DIAGNOSIS — F419 Anxiety disorder, unspecified: Secondary | ICD-10-CM

## 2024-10-03 ENCOUNTER — Telehealth: Payer: Self-pay | Admitting: Nurse Practitioner

## 2024-10-03 NOTE — Telephone Encounter (Unsigned)
 Copied from CRM [id number]. Topic: Clinical - Medication Refill >> Oct 03, 2024 10:45 AM Alfonso HERO wrote: Medication: clonazePAM  (KLONOPIN ) 1 MG tablet  Has the patient contacted their pharmacy? Yes (Agent: If no, request that the patient contact the pharmacy for the refill. If patient does not wish to contact the pharmacy document the reason why and proceed with request.) (Agent: If yes, when and what did the pharmacy advise?)  This is the patient's preferred pharmacy:  [id number] - [place],  - [address] [place] [address] Phone: [phone] Fax: [phone]   Is this the correct pharmacy for this prescription? Yes If no, delete pharmacy and type the correct one.   Has the prescription been filled recently? Yes  Is the patient out of the medication? Yes  Has the patient been seen for an appointment in the last year OR does the patient have an upcoming appointment? Yes  Can we respond through MyChart? Yes  Agent: Please be advised that Rx refills may take up to 3 business days. We ask that you follow-up with your pharmacy.

## 2024-10-07 NOTE — Progress Notes (Deleted)
     Electrophysiology Clinic Note    Date:  10/07/2024  Patient ID:  Diana Giles, Diana Giles 1971-07-15, MRN [id number] PCP:  Diana Lauraine BRAVO, Diana Giles  Cardiologist:  None  Electrophysiologist:  Diana ONEIDA HOLTS, MD    ***refresh  Discussed the use of AI scribe software for clinical note transcription with the patient, who gave verbal consent to proceed.   Patient Profile    Chief Complaint: ***  History of Present Illness: Diana Giles is a 53 y.o. female with PMH notable for SND s/p PPM, IDA, OSA ; seen today for Diana ONEIDA HOLTS, MD for routine electrophysiology followup.   She had PPM implanted in [place] prior to moving to the area.  I last saw her 04/2023 where she had SCAF on PPM, did not initiate OAC d/t low chadsvasc score.   On follow-up today *** AF burden, symptoms *** palpitations *** CPAP usage?   - full device check   Since last being seen in our clinic the patient reports doing ***.  she denies chest pain, palpitations, dyspnea, PND, orthopnea, nausea, vomiting, dizziness, syncope, edema, weight gain, or early satiety.      Arrhythmia/Device History MDT dual chamber PPM, imp 07/2020; dx SND     ROS:  Please see the history of present illness. All other systems are reviewed and otherwise negative.    Physical Exam    VS:  There were no vitals taken for this visit. BMI: There is no height or weight on file to calculate BMI.    STOP-Bang Score:     { Consider Dx Sleep Disordered Breathing or Sleep Apnea  ICD G47.33          :1}       Wt Readings from Last 3 Encounters:  09/05/24 214 lb 9.6 oz (97.3 kg)  08/17/24 214 lb 4 oz (97.2 kg)  05/18/24 210 lb (95.3 kg)     GEN- The patient is well appearing, alert and oriented x 3 today.   Lungs- Clear to ausculation bilaterally, normal work of breathing.  Heart- {Blank single:19197::Regular,Irregularly irregular} rate and rhythm, no murmurs, rubs or gallops Extremities- {EDEMA LEVEL:28147::No}  peripheral edema, warm, dry Skin-  *** device pocket well-healed, no tethering   *** Brief check performed without iterative lead testing/measurements   Device interrogation done today and reviewed by myself:  Battery *** Lead thresholds, impedence, sensing stable *** *** episodes *** changes made today   Studies Reviewed   Previous EP, cardiology notes.    EKG is ordered. Personal review of EKG from today shows:  ***             Assessment and Plan     #) SND s/p PPM     #) ***   {Are you ordering a CV Procedure (e.g. stress test, cath, DCCV, TEE, etc)?   Press F2        :[id number]}   Current medicines are reviewed at length with the patient today.   The patient {ACTIONS; HAS/DOES NOT HAVE:19233} concerns regarding her medicines.  The following changes were made today:  {NONE DEFAULTED:18576}  Labs/ tests ordered today include: *** No orders of the defined types were placed in this encounter.    Disposition: Follow up with {EPMDS:28135::EP Team} or EP APP {EPFOLLOW UP:28173}   Signed, Diana Marrin, Diana Giles  10/07/24  4:35 PM  Electrophysiology [facility]

## 2024-10-08 ENCOUNTER — Ambulatory Visit: Admitting: Cardiology

## 2024-10-08 ENCOUNTER — Encounter: Payer: Self-pay | Admitting: Oncology

## 2024-10-08 DIAGNOSIS — I495 Sick sinus syndrome: Secondary | ICD-10-CM

## 2024-10-08 DIAGNOSIS — Z950 Presence of cardiac pacemaker: Secondary | ICD-10-CM

## 2024-10-13 NOTE — Progress Notes (Unsigned)
 Electrophysiology Clinic Note    Date:  10/15/2024  Patient ID:  Diana Giles, Diana Giles 1971-05-18, MRN [id number] PCP:  Elnor Lauraine BRAVO, NP  Cardiologist:  None  Electrophysiologist:  OLE ONEIDA HOLTS, MD  Electrophysiology APP:  Wylie Coon, NP    Discussed the use of AI scribe software for clinical note transcription with the patient, who gave verbal consent to proceed.   Patient Profile    Chief Complaint: PPM follow-up  History of Present Illness: Diana Giles is a 53 y.o. female with PMH notable for SND s/p PPM, IDA, OSA, RA, fibromyalgia ; seen today for OLE ONEIDA HOLTS, MD for routine electrophysiology followup.   She had PPM implanted in [place] prior to moving to the area.  I last saw her 04/2023 where she had SCAF on PPM, did not initiate OAC d/t low chadsvasc score.   On follow-up today, she has brief palpitation episodes. She noticed they increased in burden when she lost her job in March, but seem to have improved since then. She denies chest pain, chest pressure, DOE or increased edema.   She does not use CPAP machine regularly.      Arrhythmia/Device History MDT dual chamber PPM, imp 07/2020; dx SND     ROS:  Please see the history of present illness. All other systems are reviewed and otherwise negative.    Physical Exam    VS:  BP 132/78 (BP Location: Left Arm, Patient Position: Sitting, Cuff Size: Large)   Pulse 77   Ht 5' (1.524 m)   Wt 212 lb 6.4 oz (96.3 kg)   SpO2 97%   BMI 41.48 kg/m  BMI: Body mass index is 41.48 kg/m.    STOP-Bang Score:            Wt Readings from Last 3 Encounters:  10/15/24 212 lb 6.4 oz (96.3 kg)  09/05/24 214 lb 9.6 oz (97.3 kg)  08/17/24 214 lb 4 oz (97.2 kg)     GEN- The patient is well appearing, alert and oriented x 3 today.   Lungs- Clear to ausculation bilaterally, normal work of breathing.  Heart- Regular rate and rhythm, no murmurs, rubs or gallops Extremities- No peripheral edema, warm,  dry Skin-  device pocket well-healed, no tethering ` Device interrogation done today and reviewed by myself:  Battery 11 years Lead thresholds, impedence, sensing stable  Multiple SVT episodes noted, overall burden is low. Appears to be ATach No changes made today   Studies Reviewed   Previous EP, cardiology notes.    EKG is ordered. Personal review of EKG from today shows:    EKG Interpretation Date/Time:  Monday October 15 2024 10:00:53 EST Ventricular Rate:  77 PR Interval:  134 QRS Duration:  92 QT Interval:  390 QTC Calculation: 441 R Axis:   -22  Text Interpretation: Low voltage QRS Incomplete right bundle branch block Confirmed by Jye Fariss [phone]) on 10/15/2024 10:05:16 AM      Assessment and Plan     #) SND s/p PPM #) SVT Device functioning well, see paceart for details Battery good Lead measurements stable Low VP Paroxysmal SVT episodes noted, overall burden is low. Patient is generally asymptomatic, will continue to monitor via PPM  #) OSA Encouraged nightly CPAP compliance    Current medicines are reviewed at length with the patient today.   The patient does not have concerns regarding her medicines.  The following changes were made today:  none  Labs/ tests ordered today  include:  Orders Placed This Encounter  Procedures   EKG 12-Lead     Disposition: Follow up with Dr. Kennyth or EP APP in 2 years , continue remote monitoring every 3 months   Signed, Chantal Needle, NP  10/15/24  12:21 PM  Electrophysiology [facility]

## 2024-10-15 ENCOUNTER — Encounter: Payer: Self-pay | Admitting: Cardiology

## 2024-10-15 ENCOUNTER — Ambulatory Visit: Attending: Cardiology | Admitting: Cardiology

## 2024-10-15 VITALS — BP 132/78 | HR 77 | Ht 60.0 in | Wt 212.4 lb

## 2024-10-15 DIAGNOSIS — G4733 Obstructive sleep apnea (adult) (pediatric): Secondary | ICD-10-CM | POA: Diagnosis not present

## 2024-10-15 DIAGNOSIS — I471 Supraventricular tachycardia, unspecified: Secondary | ICD-10-CM | POA: Diagnosis not present

## 2024-10-15 DIAGNOSIS — I495 Sick sinus syndrome: Secondary | ICD-10-CM

## 2024-10-15 DIAGNOSIS — Z950 Presence of cardiac pacemaker: Secondary | ICD-10-CM | POA: Diagnosis not present

## 2024-10-15 LAB — CUP PACEART INCLINIC DEVICE CHECK
Date Time Interrogation Session: 20251208122716
Implantable Lead Connection Status: 753985
Implantable Lead Connection Status: 753985
Implantable Lead Implant Date: 20210928
Implantable Lead Implant Date: 20210928
Implantable Lead Location: 753859
Implantable Lead Location: 753860
Implantable Lead Model: 4076
Implantable Lead Model: 4076
Implantable Pulse Generator Implant Date: 20210928

## 2024-10-15 NOTE — Patient Instructions (Signed)
 Medication Instructions:  Your physician recommends that you continue on your current medications as directed. Please refer to the Current Medication list given to you today.  *If you need a refill on your cardiac medications before your next appointment, please call your pharmacy*  Lab Work: No labs ordered today    Testing/Procedures: No test ordered today   Follow-Up: At [facility], you and your health needs are our priority.  As part of our continuing mission to provide you with exceptional heart care, our providers are all part of one team.  This team includes your primary Cardiologist (physician) and Advanced Practice Providers or APPs (Physician Assistants and Nurse Practitioners) who all work together to provide you with the care you need, when you need it.  Your next appointment:   2 year(s)  Provider:   Suzann Riddle, NP

## 2024-10-24 ENCOUNTER — Ambulatory Visit: Payer: Self-pay | Admitting: Cardiology

## 2024-10-30 ENCOUNTER — Telehealth: Payer: Self-pay | Admitting: Nurse Practitioner

## 2024-10-30 ENCOUNTER — Ambulatory Visit: Admitting: Nurse Practitioner

## 2024-10-30 ENCOUNTER — Encounter: Payer: Self-pay | Admitting: Oncology

## 2024-10-30 VITALS — BP 108/64 | HR 85 | Temp 97.6°F | Ht 60.0 in | Wt 213.5 lb

## 2024-10-30 DIAGNOSIS — F419 Anxiety disorder, unspecified: Secondary | ICD-10-CM

## 2024-10-30 DIAGNOSIS — R102 Pelvic and perineal pain unspecified side: Secondary | ICD-10-CM | POA: Diagnosis not present

## 2024-10-30 DIAGNOSIS — Z6841 Body Mass Index (BMI) 40.0 and over, adult: Secondary | ICD-10-CM | POA: Diagnosis not present

## 2024-10-30 DIAGNOSIS — H9190 Unspecified hearing loss, unspecified ear: Secondary | ICD-10-CM | POA: Insufficient documentation

## 2024-10-30 DIAGNOSIS — R21 Rash and other nonspecific skin eruption: Secondary | ICD-10-CM

## 2024-10-30 DIAGNOSIS — E66813 Obesity, class 3: Secondary | ICD-10-CM

## 2024-10-30 DIAGNOSIS — B369 Superficial mycosis, unspecified: Secondary | ICD-10-CM | POA: Diagnosis not present

## 2024-10-30 DIAGNOSIS — Z1239 Encounter for other screening for malignant neoplasm of breast: Secondary | ICD-10-CM | POA: Insufficient documentation

## 2024-10-30 MED ORDER — CLOTRIMAZOLE 1 % EX CREA: 1.0000 | TOPICAL_CREAM | Freq: Two times a day (BID) | CUTANEOUS | 0 refills | Status: AC

## 2024-10-30 MED ORDER — CLONAZEPAM 1 MG PO TABS: 1.0000 mg | ORAL_TABLET | Freq: Two times a day (BID) | ORAL | 0 refills | Status: DC | PRN

## 2024-10-30 NOTE — Telephone Encounter (Signed)
 Please start prior auth for mounjaro current BMI is 41.70

## 2024-10-30 NOTE — Assessment & Plan Note (Signed)
 Recurrent intertriginous fungal rash Recurrent rash in abdominal apron likely due to yeast vs fungal overgrowth, improved with nystatin  in the past, but is not responding as well recently  - Prescribed clotrimazole , apply twice daily.

## 2024-10-30 NOTE — Progress Notes (Signed)
 "  Established Patient Office Visit  Subjective   Patient ID: Diana Giles, female    DOB: 08-22-1971  Age: 53 y.o. MRN: [id number]  Chief Complaint  Patient presents with   Medical Management of Chronic Issues    Wants alternative for the wegovy  since she was denied for it, referral to audiologist due to hearing and also rash on the left side of the belly.    Discussed the use of AI scribe software for clinical note transcription with the patient, who gave verbal consent to proceed.  History of Present Illness Diana Giles is a 53 year old female who presents for an alternative weight management medication.  Obesity and weight management - BMI is 41. - Denied insurance coverage for Wegovy ; interested in alternatives such as Zepbound - Identifies stress as a contributing factor to weight issues.  Intertriginous dermatitis - Recurrent itchy rash under abdominal pannus. - Rash improves temporarily with nystatin  cream and keeping the area dry, but recurs, often more on left side.  Lower abdominal pain and uterine fibroids - History of lower abdominal pain and fibroids identified on ultrasound. - No follow-up with gynecology despite prior referral.  Hearing loss - Reports she feels that she would like hearing evaluation with audiologist     Review of Systems  Respiratory:  Negative for shortness of breath.   Cardiovascular:  Negative for chest pain and palpitations.      Objective:     BP 108/64   Pulse 85   Temp 97.6 F (36.4 C) (Temporal)   Ht 5' (1.524 m)   Wt 213 lb 8 oz (96.8 kg)   SpO2 95%   BMI 41.70 kg/m  BP Readings from Last 3 Encounters:  10/30/24 108/64  10/15/24 132/78  09/05/24 124/81   Wt Readings from Last 3 Encounters:  10/30/24 213 lb 8 oz (96.8 kg)  10/15/24 212 lb 6.4 oz (96.3 kg)  09/05/24 214 lb 9.6 oz (97.3 kg)      Physical Exam Vitals reviewed.  Constitutional:      General: She is not in acute distress.    Appearance:  Normal appearance.  HENT:     Head: Normocephalic and atraumatic.  Cardiovascular:     Rate and Rhythm: Normal rate and regular rhythm.     Pulses: Normal pulses.     Heart sounds: Normal heart sounds.  Pulmonary:     Effort: Pulmonary effort is normal.     Breath sounds: Normal breath sounds.  Abdominal:   Skin:    General: Skin is warm and dry.  Neurological:     General: No focal deficit present.     Mental Status: She is alert and oriented to person, place, and time.  Psychiatric:        Mood and Affect: Mood normal.        Behavior: Behavior normal.        Judgment: Judgment normal.      No results found for any visits on 10/30/24.    The 10-year ASCVD risk score (Arnett DK, et al., 2019) is: 0.8%    Assessment & Plan:   Problem List Items Addressed This Visit       Nervous and Auditory   Hearing loss - Primary   Referral to audiologist ordered      Relevant Orders   Ambulatory referral to Audiology     Musculoskeletal and Integument   Rash   Recurrent intertriginous fungal rash Recurrent rash in abdominal apron likely due  to yeast vs fungal overgrowth, improved with nystatin  in the past, but is not responding as well recently  - Prescribed clotrimazole , apply twice daily.        Other   Anxiety   Chronic, stable Refill for clonazepam  prescribed      Relevant Medications   clonazePAM  (KLONOPIN ) 1 MG tablet   Class 3 severe obesity with body mass index (BMI) of 40.0 to 44.9 in adult Diana Giles)   Class 3 severe obesity BMI 41. Previous denial for Wegovy . Discussed Zepbound, with potential denial for Zepbound. Phentermine not preferred due to cardiovascular risks. - Submitted request for Zepbound approval.      Pelvic pain    Pelvic pain due to uterine fibroids Pelvic pain from fibroids growing into uterine wall. Prefers local specialist  - Referred to OBGYN at [facility] for further evaluation and management.      Relevant Orders    Ambulatory referral to Obstetrics / Gynecology   Encounter for screening for malignant neoplasm of breast    Breast cancer screening Mammogram not completed due to referral issues. - Ordered mammogram for timely screening.      Relevant Orders   MM 3D SCREENING MAMMOGRAM BILATERAL BREAST   Other Visit Diagnoses       Fungal rash of trunk       Relevant Medications   clotrimazole  (LOTRIMIN ) 1 % cream   Other Relevant Orders   Ambulatory referral to Plastic Surgery      Assessment and Plan Assessment & Plan Recurrent intertriginous fungal rash Recurrent rash in abdominal apron likely due to yeast vs fungal overgrowth, improved with nystatin  in the past, but is not responding as well recently  - Prescribed clotrimazole , apply twice daily.  Class 3 severe obesity BMI 41. Previous denial for Wegovy . Discussed Zepbound, with potential denial for Zepbound. Phentermine not preferred due to cardiovascular risks. - Submitted request for Zepbound approval.  Pelvic pain due to uterine fibroids Pelvic pain from fibroids growing into uterine wall. Prefers local specialist  - Referred to OBGYN at [facility] for further evaluation and management.  Breast cancer screening Mammogram not completed due to referral issues. - Ordered mammogram for timely screening.    Return in about 3 months (around 01/28/2025) for F/U with Diana.    Diana FORBES Pereyra, NP  "

## 2024-10-30 NOTE — Assessment & Plan Note (Signed)
" °  Pelvic pain due to uterine fibroids Pelvic pain from fibroids growing into uterine wall. Prefers local specialist  - Referred to OBGYN at [facility] for further evaluation and management. "

## 2024-10-30 NOTE — Assessment & Plan Note (Signed)
 Referral to audiologist ordered

## 2024-10-30 NOTE — Assessment & Plan Note (Signed)
 Chronic, stable Refill for clonazepam  prescribed

## 2024-10-30 NOTE — Assessment & Plan Note (Signed)
 Class 3 severe obesity BMI 41. Previous denial for Wegovy . Discussed Zepbound, with potential denial for Zepbound. Phentermine not preferred due to cardiovascular risks. - Submitted request for Zepbound approval.

## 2024-10-30 NOTE — Assessment & Plan Note (Signed)
" °  Breast cancer screening Mammogram not completed due to referral issues. - Ordered mammogram for timely screening. "

## 2024-10-31 ENCOUNTER — Other Ambulatory Visit (HOSPITAL_COMMUNITY): Payer: Self-pay

## 2024-10-31 ENCOUNTER — Telehealth: Payer: Self-pay

## 2024-10-31 NOTE — Telephone Encounter (Signed)
 Pharmacy Patient Advocate Encounter   Received notification from Pt Calls Messages that prior authorization for Zepbound 2.5mg /0.26ml is required/requested.   Insurance verification completed.   The patient is insured through [facility].   Per test claim: PA required; PA started via CoverMyMeds. KEY [id number] . Please see clinical question(s) below that I am not finding the answer to in their chart and advise.

## 2024-11-02 ENCOUNTER — Ambulatory Visit

## 2024-11-02 DIAGNOSIS — I495 Sick sinus syndrome: Secondary | ICD-10-CM

## 2024-11-04 ENCOUNTER — Ambulatory Visit: Payer: Self-pay | Admitting: Cardiology

## 2024-11-04 LAB — CUP PACEART REMOTE DEVICE CHECK
Battery Remaining Longevity: 133 mo
Battery Voltage: 3.02 V
Brady Statistic AP VP Percent: 0.02 %
Brady Statistic AP VS Percent: 2.99 %
Brady Statistic AS VP Percent: 0.03 %
Brady Statistic AS VS Percent: 96.96 %
Brady Statistic RA Percent Paced: 3.01 %
Brady Statistic RV Percent Paced: 0.05 %
Date Time Interrogation Session: 20251226002127
Implantable Lead Connection Status: 753985
Implantable Lead Connection Status: 753985
Implantable Lead Implant Date: 20210928
Implantable Lead Implant Date: 20210928
Implantable Lead Location: 753859
Implantable Lead Location: 753860
Implantable Lead Model: 4076
Implantable Lead Model: 4076
Implantable Pulse Generator Implant Date: 20210928
Lead Channel Impedance Value: 304 Ohm
Lead Channel Impedance Value: 342 Ohm
Lead Channel Impedance Value: 399 Ohm
Lead Channel Impedance Value: 456 Ohm
Lead Channel Pacing Threshold Amplitude: 0.625 V
Lead Channel Pacing Threshold Amplitude: 0.625 V
Lead Channel Pacing Threshold Pulse Width: 0.4 ms
Lead Channel Pacing Threshold Pulse Width: 0.4 ms
Lead Channel Sensing Intrinsic Amplitude: 2.25 mV
Lead Channel Sensing Intrinsic Amplitude: 2.25 mV
Lead Channel Sensing Intrinsic Amplitude: 7.625 mV
Lead Channel Sensing Intrinsic Amplitude: 7.625 mV
Lead Channel Setting Pacing Amplitude: 1.5 V
Lead Channel Setting Pacing Amplitude: 2 V
Lead Channel Setting Pacing Pulse Width: 0.4 ms
Lead Channel Setting Sensing Sensitivity: 0.9 mV
Zone Setting Status: 755011

## 2024-11-05 NOTE — Progress Notes (Signed)
 Remote PPM Transmission

## 2024-11-07 ENCOUNTER — Ambulatory Visit: Admitting: Plastic Surgery

## 2024-11-07 VITALS — BP 133/83 | HR 80 | Ht 60.0 in | Wt 213.8 lb

## 2024-11-07 DIAGNOSIS — Z9884 Bariatric surgery status: Secondary | ICD-10-CM

## 2024-11-07 DIAGNOSIS — M793 Panniculitis, unspecified: Secondary | ICD-10-CM

## 2024-11-07 DIAGNOSIS — Z6841 Body Mass Index (BMI) 40.0 and over, adult: Secondary | ICD-10-CM

## 2024-11-07 DIAGNOSIS — R21 Rash and other nonspecific skin eruption: Secondary | ICD-10-CM

## 2024-11-07 DIAGNOSIS — E66813 Obesity, class 3: Secondary | ICD-10-CM

## 2024-11-07 DIAGNOSIS — E65 Localized adiposity: Secondary | ICD-10-CM | POA: Diagnosis not present

## 2024-11-07 NOTE — Telephone Encounter (Signed)
 Closing the encounter. Will submit a PA after patient completes 6 month provider-directed weight loss recommendations.

## 2024-11-07 NOTE — Progress Notes (Signed)
 "   Referring Provider Elnor Lauraine BRAVO, NP [address] [place],  [place] [address]   CC:  Chief Complaint  Patient presents with   Advice Only      Kyrstyn Greear is an 53 y.o. female.  HPI: Ms. Rausch is a 54 year old female who presents today for evaluation for a panniculectomy.  She underwent gastric bypass in 2009 and has lost approximately 285 pounds.  She now has excess skin and fat on the anterior abdominal wall which is frequently irritated and infected.  It also interferes with her daily activities and ability to wear clothing properly.  She would like to have this removed.  She is not a smoker and does not use blood thinners.  Allergies[1]  Outpatient Encounter Medications as of 11/07/2024  Medication Sig Note   acetaminophen (TYLENOL) 500 MG tablet Take 500 mg by mouth every 6 (six) hours as needed.    albuterol  (VENTOLIN  HFA) 108 (90 Base) MCG/ACT inhaler Inhale 2 puffs into the lungs every 4 (four) hours as needed.    busPIRone  (BUSPAR ) 10 MG tablet TAKE 1 TABLET BY MOUTH TWICE A DAY    Cholecalciferol (VITAMIN D3) 125 MCG (5000 UT) CAPS Take 2 capsules by mouth in the morning and at bedtime. 2 in AM & 2 in PM. (10,000iu /2xday). 09/27/2022: Vit D takes every other day.    clonazePAM  (KLONOPIN ) 1 MG tablet Take 1 tablet (1 mg total) by mouth 2 (two) times daily as needed for anxiety.    clotrimazole  (LOTRIMIN ) 1 % cream Apply 1 Application topically 2 (two) times daily.    cyanocobalamin  (VITAMIN B12) 1000 MCG/ML injection Inject 1ml into the muscle once a week for 4 weeks. Then inject 1ml into the muscle once a month.    docusate sodium (COLACE) 100 MG capsule Take 100 mg by mouth daily as needed for mild constipation.    DULoxetine  (CYMBALTA ) 60 MG capsule TAKE 1 CAPSULE BY MOUTH 2 TIMES DAILY.    EPINEPHrine  0.3 mg/0.3 mL IJ SOAJ injection Inject 0.3 mg into the muscle as needed for anaphylaxis.    ibuprofen (ADVIL) 200 MG tablet Take 400 mg by mouth every 6 (six)  hours as needed.    Melatonin 1 MG CAPS Take by mouth.    nystatin  cream (MYCOSTATIN ) APPLY TO AFFECTED AREA TWICE A DAY    omeprazole (PRILOSEC) 20 MG capsule Take 20 mg by mouth daily.    pregabalin  (LYRICA ) 100 MG capsule Take 1 capsule (100 mg total) by mouth 2 (two) times daily.    progesterone  (PROMETRIUM ) 200 MG capsule TAKE 1 DAILY FOR 21 DAYS EVERY MONTH    SYRINGE-NEEDLE, DISP, 3 ML (BD SAFETYGLIDE SYRINGE/NEEDLE) 25G X 1 3 ML MISC Use to inject vitamin B12 into the muscle as directed    traMADol  (ULTRAM ) 50 MG tablet Take 1 tablet (50 mg total) by mouth every 6 (six) hours as needed. for pain    valACYclovir  (VALTREX ) 500 MG tablet TAKE 1 TABLET BY MOUTH TWICE A DAY    No facility-administered encounter medications on file as of 11/07/2024.     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    Mental disorder    Rheumatoid arthritis (HCC)    Sinus node dysfunction (HCC)    s/p pacemaker   Sleep apnea    Thrombocytosis    Vaginal Pap smear, abnormal     Past Surgical History:  Procedure Laterality Date   CESAREAN SECTION  06/02/94   GASTRIC BYPASS  2010   285lbs at highest weight   HYSTEROSCOPY WITH D & C  08/22/2019   JOINT REPLACEMENT  05/2019   Revision Right knee   OTHER SURGICAL HISTORY  08/2019   Bilateral fallopian tube removal   PACEMAKER PLACEMENT  08/06/2020   REVISION TOTAL KNEE ARTHROPLASTY Right 06/01/2019   SHOULDER ARTHROSCOPY Left 2018   TUBAL LIGATION Bilateral 08/07/2019    Family History  Problem Relation Age of Onset   Hypertension Mother    Stroke Mother    Asthma Mother    COPD Mother    Early death Mother    Diabetes Father     Heart disease Father    Early death Father    Kidney disease Father    Hyperthyroidism Sister    COPD Sister    Asthma Sister    COPD Sister    Miscarriages / Stillbirths Sister    Obesity Brother    Hypertension Brother    Asthma Brother    Anxiety disorder Brother    Asthma Brother    Obesity Brother    Diabetes Brother    Asthma Brother    Asthma Brother    Polycystic ovary syndrome Daughter    Asthma Son    Obesity Son    ADD / ADHD Son    Colon cancer Cousin    Rectal cancer Neg Hx    Stomach cancer Neg Hx     Social History   Social History Narrative   Separated for 12 years,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   Work for [employer].   Caffiene coffee 2 cups.  Cokes 2 daily.     Review of Systems General: Denies fevers, chills, weight loss CV: Denies chest pain, shortness of breath, palpitations Abdomen: Excess skin and fat on the anterior abdominal wall which is frequently infected or irritated  Physical Exam    11/07/2024    1:44 PM 10/30/2024    9:38 AM 10/15/2024   10:02 AM  Vitals with BMI  Height 5' 0 5' 0 5' 0  Weight 213 lbs 13 oz 213 lbs 8 oz 212 lbs 6 oz  BMI 41.75 41.7 41.48  Systolic 133 108 867  Diastolic 83 64 78  Pulse 80 85 77    General:  No acute distress,  Alert and oriented, Non-Toxic, Normal speech and affect Abdomen: Not examined on this visit Mammogram: Not applicable Assessment/Plan Panniculitis: Patient has a large pannus and I suspect she will eventually be a good candidate for panniculectomy.  Currently her BMI is a 41.7.  We have discussed that my upper limit is 38.  She understands that this is due to the increased risk of wound healing and DVT complications.  We discussed strategies for weight loss including the importance of resistance training.  We also discussed the importance of the primary portion of her diet being protein.  Will refer her to healthy weight and wellness.  She will follow-up with me when her  weight is closer to a BMI of 38 38.  Leonce KATHEE Birmingham 11/07/2024, 3:04 PM         [  1]  Allergies Allergen Reactions   Nickel Other (See Comments)    Other Reaction(s): Not available   Penicillins Other (See Comments)    Other Reaction(s): Not available   Shellfish Allergy    Sulfa Antibiotics Rash   "

## 2024-11-12 ENCOUNTER — Encounter: Payer: Self-pay | Admitting: Audiologist

## 2024-11-12 ENCOUNTER — Ambulatory Visit: Attending: Nurse Practitioner | Admitting: Audiologist

## 2024-11-12 DIAGNOSIS — R94120 Abnormal auditory function study: Secondary | ICD-10-CM | POA: Insufficient documentation

## 2024-11-12 DIAGNOSIS — H900 Conductive hearing loss, bilateral: Secondary | ICD-10-CM | POA: Diagnosis present

## 2024-11-12 NOTE — Procedures (Signed)
" °  Outpatient Audiology and [facility] [address] [place], [place]  [address] [phone]  AUDIOLOGICAL  EVALUATION  NAME: Diana Giles     DOB:   01-06-1971      MRN: [id number]                                                                                     DATE: 11/12/2024     REFERENT: Elnor Lauraine BRAVO, NP STATUS: Outpatient DIAGNOSIS: Conductive Hearing Loss Bilateral    History: Keala was seen for an audiological evaluation due to difficulty hearing from both ears. She moved from [place]  around five years ago. In [place] she was followed by Otolaryngology due to history of middle ear issues. She has chronic ear infections from childhood up to her 42s and 30s. Her previous Otolaryngologist told her there was nothing that could be done to correct her hearing. She has not had a hearing test since moving to [place] . She feels her hearing had worsened since her last test.  Magen has a brother with a hearing implant. Emmaly has tinnitus in both ears. She denies pain or pressure in either ear.   Evaluation:  Otoscopy showed a clear view of the tympanic membranes, bilaterally Tympanometry results were consistent with hypercompliance in the left ear and normal function in right ear Audiometric testing was completed using Conventional Audiometry techniques with insert earphones and supraural headphones. Test results are consistent with mild to moderate bilateral conductive hearing loss. Speech Recognition Thresholds were obtained at  30dB HL in the right ear and at 40dB HL in the left ear. Word Recognition Testing was completed at  40dB SL and Prabhleen scored 100% in each ear with masking.    Results:  The test results were reviewed with Lolah. Syan has a bilateral conductive hearing loss. She has normal middle ear function in the right ear and hypercompliance in the left. She has scarring visible on both tympanic membranes.  Audiogram printed and provided to  Krystel.  Recommendations: Elnor Lauraine BRAVO, NP: Please refer Crystalyn to [facility]NT Dr. Herminio. Carolynne is a candidate for a Bone Anchored Hearing Aid (BAHA) and will need to decide between a BAHA and traditional hearing aids.     30 minutes spent testing and counseling on results.   If you have any questions please feel free to contact me at [phone].  Lauraine Ka Stalnaker Au.D.  Audiologist   11/12/2024  1:24 PM  Cc: Elnor Lauraine BRAVO, NP  "

## 2024-11-13 DIAGNOSIS — H9190 Unspecified hearing loss, unspecified ear: Secondary | ICD-10-CM

## 2024-11-15 ENCOUNTER — Encounter: Payer: Self-pay | Admitting: Nurse Practitioner

## 2024-11-19 ENCOUNTER — Encounter (INDEPENDENT_AMBULATORY_CARE_PROVIDER_SITE_OTHER): Payer: Self-pay

## 2024-11-23 ENCOUNTER — Encounter: Payer: Self-pay | Admitting: Oncology

## 2024-11-23 ENCOUNTER — Ambulatory Visit
Admission: RE | Admit: 2024-11-23 | Discharge: 2024-11-23 | Disposition: A | Discharge status: Home / Self Care | Source: Ambulatory Visit | Attending: Nurse Practitioner | Admitting: Nurse Practitioner

## 2024-11-23 DIAGNOSIS — Z1239 Encounter for other screening for malignant neoplasm of breast: Secondary | ICD-10-CM

## 2024-11-28 ENCOUNTER — Other Ambulatory Visit: Payer: Self-pay | Admitting: Nurse Practitioner

## 2024-11-28 DIAGNOSIS — R928 Other abnormal and inconclusive findings on diagnostic imaging of breast: Secondary | ICD-10-CM

## 2024-12-03 ENCOUNTER — Other Ambulatory Visit: Payer: Self-pay | Admitting: Physical Medicine and Rehabilitation

## 2024-12-03 ENCOUNTER — Telehealth

## 2024-12-05 ENCOUNTER — Ambulatory Visit: Payer: Self-pay

## 2024-12-07 ENCOUNTER — Other Ambulatory Visit: Payer: Self-pay | Admitting: Nurse Practitioner

## 2024-12-07 ENCOUNTER — Other Ambulatory Visit

## 2024-12-07 ENCOUNTER — Encounter

## 2024-12-07 ENCOUNTER — Encounter: Payer: Self-pay | Admitting: Oncology

## 2024-12-07 ENCOUNTER — Ambulatory Visit
Admission: RE | Admit: 2024-12-07 | Discharge: 2024-12-07 | Disposition: A | Discharge status: Home / Self Care | Source: Ambulatory Visit

## 2024-12-07 ENCOUNTER — Other Ambulatory Visit: Payer: Self-pay | Admitting: Family

## 2024-12-07 VITALS — BP 139/86 | HR 73 | Temp 97.8°F | Resp 20

## 2024-12-07 DIAGNOSIS — F419 Anxiety disorder, unspecified: Secondary | ICD-10-CM

## 2024-12-07 DIAGNOSIS — J209 Acute bronchitis, unspecified: Secondary | ICD-10-CM

## 2024-12-07 MED ORDER — PREDNISONE 10 MG (21) PO TBPK: ORAL_TABLET | Freq: Every day | ORAL | 0 refills | Status: AC

## 2024-12-07 MED ORDER — DOXYCYCLINE HYCLATE 100 MG PO CAPS: 100.0000 mg | ORAL_CAPSULE | Freq: Two times a day (BID) | ORAL | 0 refills | Status: AC

## 2024-12-07 MED ORDER — BENZONATATE 100 MG PO CAPS: 100.0000 mg | ORAL_CAPSULE | Freq: Three times a day (TID) | ORAL | 0 refills | Status: AC

## 2024-12-07 NOTE — Discharge Instructions (Signed)
 On exam able to hear wheezing which is a sign of airway tightness to her lungs however you are getting enough air without assistance low suspicion that you have pneumonia  Begin doxycycline  twice daily for 7 days for treatment of bacteria contributing to symptoms  Begin prednisone  every morning with food to open and relax the airway making it easier for you to breathe, may continue use inhaler as needed  May continue Tessalon  pill as needed for cough    You can take Tylenol  as needed for fever reduction and pain relief.   For cough: honey 1/2 to 1 teaspoon (you can dilute the honey in water or another fluid).  You can also use guaifenesin and dextromethorphan for cough. You can use a humidifier for chest congestion and cough.  If you don't have a humidifier, you can sit in the bathroom with the hot shower running.      For sore throat: try warm salt water gargles, cepacol lozenges, throat spray, warm tea or water with lemon/honey, popsicles or ice, or OTC cold relief medicine for throat discomfort.   For congestion: take a daily anti-histamine like Zyrtec, Claritin, and a oral decongestant, such as pseudoephedrine.  You can also use Flonase 1-2 sprays in each nostril daily.   It is important to stay hydrated: drink plenty of fluids (water, gatorade/powerade/pedialyte, juices, or teas) to keep your throat moisturized and help further relieve irritation/discomfort.

## 2024-12-07 NOTE — ED Provider Notes (Signed)
 " CAY RALPH PELT    CSN: [id number] Arrival date & time: 12/07/24  1248      History   Chief Complaint Chief Complaint  Patient presents with   Cough    Labored breathing - Entered by patient   Nasal Congestion   Shortness of Breath    HPI Diana Giles is a 54 y.o. female.   Patient presents for evaluation of nasal congestion, left-sided ear pain, rhinorrhea, sinus pressure, sore throat, productive cough with thick Halvor Behrend sputum, shortness of breath with exertion and wheezing at nighttime present for 7 days.  Associated headaches elicited by cough.  Sore throat has resolved.  Cough contgestion and sob for 7 days.  Experiencing fever which has resolved.  No known sick contacts prior.  Was evaluated in the past when, prescribed Tessalon  and albuterol  inhaler, has been helpful but symptoms worsening.  Denies respiratory history, non-smoker.  Has attempted use of nasal lavage, nasal spray additionally.    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    Mental disorder    Rheumatoid arthritis (HCC)    Sinus node dysfunction (HCC)    s/p pacemaker   Sleep apnea    Thrombocytosis    Vaginal Pap smear, abnormal     Patient Active Problem List   Diagnosis Date Noted   Hearing loss 10/30/2024   Pelvic pain 10/30/2024   Encounter for screening for malignant neoplasm of breast 10/30/2024   GERD (gastroesophageal reflux disease) 08/17/2024   Recently unemployed 05/02/2024   Insomnia 05/02/2024   Memory difficulties 05/02/2024   Class 3 severe obesity with body mass index (BMI) of 40.0 to 44.9 in adult [facility]) 02/08/2024   Dysuria 02/03/2024   Lumbosacral spondylosis  without myelopathy 01/30/2024   Fibroids 01/30/2024   Abdominal pain 10/14/2023   Chronic constipation 10/14/2023   History of anaphylaxis 07/15/2023   Rash 07/15/2023   Acute pain of right shoulder 07/15/2023   Iron  deficiency anemia, unspecified 04/29/2023   Anemia 04/15/2023   Abnormal TSH 04/15/2023   Generalized abdominal pain 03/25/2023   Change in nail appearance 03/25/2023   Diabetes mellitus screening 03/25/2023   Encounter for lipid screening for cardiovascular disease 03/25/2023   Encounter for hepatitis C screening test for low risk patient 03/25/2023   Encounter for long-term opiate analgesic use 03/25/2023   COVID-19 11/26/2022   Chronic bilateral low back pain with right-sided sciatica 09/15/2022   Arthritis pain 09/15/2022   Fibromyalgia 09/15/2022   Chronic pain syndrome 09/15/2022   Bilateral sacroiliitis 09/15/2022   Postmenopause 08/09/2022   Pacemaker 01/07/2021   Fatigue 11/13/2020   DDD (degenerative disc disease), lumbar 11/13/2020   Avascular necrosis of bone of hip, right (HCC) 11/13/2020   Sinus node dysfunction (HCC) 11/13/2020   Thrombocytosis 11/13/2020   Anxiety 11/13/2020   Attention deficit hyperactivity disorder (ADHD) 11/13/2020    Past Surgical History:  Procedure Laterality Date   BREAST BIOPSY     CESAREAN SECTION  06/02/94   GASTRIC BYPASS  2010   285lbs at highest weight   HYSTEROSCOPY WITH D & C  08/22/2019  JOINT REPLACEMENT  05/2019   Revision Right knee   OTHER SURGICAL HISTORY  08/2019   Bilateral fallopian tube removal   PACEMAKER PLACEMENT  08/06/2020   REDUCTION MAMMAPLASTY     REVISION TOTAL KNEE ARTHROPLASTY Right 06/01/2019   SHOULDER ARTHROSCOPY Left 2018   TUBAL LIGATION Bilateral 08/07/2019    OB History     Gravida  2   Para  2   Term  2   Preterm      AB      Living  2      SAB      IAB      Ectopic      Multiple      Live Births  2            Home Medications    Prior to  Admission medications  Medication Sig Start Date End Date Taking? Authorizing Provider  benzonatate  (TESSALON ) 100 MG capsule Take 100 mg by mouth 3 (three) times daily as needed. 12/03/24 12/10/24 Yes [provider]  acetaminophen (TYLENOL) 500 MG tablet Take 500 mg by mouth every 6 (six) hours as needed.    [provider]  albuterol  (VENTOLIN  HFA) 108 (90 Base) MCG/ACT inhaler Inhale 2 puffs into the lungs every 4 (four) hours as needed. 10/05/24   [provider]  busPIRone  (BUSPAR ) 10 MG tablet TAKE 1 TABLET BY MOUTH TWICE A DAY 05/12/24   Webb, Padonda B, FNP  Cholecalciferol (VITAMIN D3) 125 MCG (5000 UT) CAPS Take 2 capsules by mouth in the morning and at bedtime. 2 in AM & 2 in PM. (10,000iu /2xday).    [provider]  clonazePAM  (KLONOPIN ) 1 MG tablet Take 1 tablet (1 mg total) by mouth 2 (two) times daily as needed for anxiety. 10/30/24   Gray, Sarah E, NP  clotrimazole  (LOTRIMIN ) 1 % cream Apply 1 Application topically 2 (two) times daily. 10/30/24   Elnor Lauraine BRAVO, NP  cyanocobalamin  (VITAMIN B12) 1000 MCG/ML injection Inject 1ml into the muscle once a week for 4 weeks. Then inject 1ml into the muscle once a month. 08/23/24   Elnor Lauraine BRAVO, NP  docusate sodium (COLACE) 100 MG capsule Take 100 mg by mouth daily as needed for mild constipation.    [provider]  DULoxetine  (CYMBALTA ) 60 MG capsule TAKE 1 CAPSULE BY MOUTH 2 TIMES DAILY. 06/10/24   Webb, Padonda B, FNP  EPINEPHrine  0.3 mg/0.3 mL IJ SOAJ injection Inject 0.3 mg into the muscle as needed for anaphylaxis. 07/15/23   Gray, Sarah E, NP  ibuprofen (ADVIL) 200 MG tablet Take 400 mg by mouth every 6 (six) hours as needed.    [provider]  Melatonin 1 MG CAPS Take by mouth.    [provider]  nystatin  cream (MYCOSTATIN ) APPLY TO AFFECTED AREA TWICE A DAY 09/10/24   Gray, Sarah E, NP  omeprazole (PRILOSEC) 20 MG capsule Take 20 mg by mouth daily.    [provider]   pregabalin  (LYRICA ) 100 MG capsule Take 1 capsule (100 mg total) by mouth 2 (two) times daily. 05/07/24 11/07/24  Emeline Search C, DO  progesterone  (PROMETRIUM ) 200 MG capsule TAKE 1 DAILY FOR 21 DAYS EVERY MONTH 08/30/23   Signa Nest A, NP  SYRINGE-NEEDLE, DISP, 3 ML (BD SAFETYGLIDE SYRINGE/NEEDLE) 25G X 1 3 ML MISC Use to inject vitamin B12 into the muscle as directed 08/23/24   Elnor Lauraine BRAVO, NP  traMADol  (ULTRAM ) 50 MG tablet Take 1 tablet (  50 mg total) by mouth every 6 (six) hours as needed. for pain 12/04/24 01/03/25  Emeline Joesph BROCKS, DO  valACYclovir  (VALTREX ) 500 MG tablet TAKE 1 TABLET BY MOUTH TWICE A DAY 05/29/24   Jayne Vonn DEL, MD    Family History Family History  Problem Relation Age of Onset   Hypertension Mother    Stroke Mother    Asthma Mother    COPD Mother    Early death Mother    Diabetes Father    Heart disease Father    Early death Father    Kidney disease Father    Hyperthyroidism Sister    COPD Sister    Asthma Sister    COPD Sister    Miscarriages / Stillbirths Sister    Obesity Brother    Hypertension Brother    Asthma Brother    Anxiety disorder Brother    Asthma Brother    Obesity Brother    Diabetes Brother    Asthma Brother    Asthma Brother    Polycystic ovary syndrome Daughter    Asthma Son    Obesity Son    ADD / ADHD Son    Colon cancer Cousin    Rectal cancer Neg Hx    Stomach cancer Neg Hx     Social History Social History[1]   Allergies   Nickel, Penicillins, Shellfish allergy, and Sulfa antibiotics   Review of Systems Review of Systems  Constitutional: Negative.   HENT:  Positive for congestion, ear pain, rhinorrhea, sinus pressure and sore throat. Negative for dental problem, drooling, ear discharge, facial swelling, hearing loss, mouth sores, nosebleeds, postnasal drip, sinus pain, sneezing, tinnitus, trouble swallowing and voice change.   Respiratory:  Positive for cough, shortness of breath and wheezing.  Negative for apnea, choking, chest tightness and stridor.   Neurological:  Positive for headaches. Negative for dizziness, tremors, seizures, syncope, facial asymmetry, speech difficulty, weakness, light-headedness and numbness.     Physical Exam Triage Vital Signs ED Triage Vitals [12/07/24 1311]  Encounter Vitals Group     BP 139/86     Girls Systolic BP Percentile      Girls Diastolic BP Percentile      Boys Systolic BP Percentile      Boys Diastolic BP Percentile      Pulse Rate 73     Resp 20     Temp 97.8 F (36.6 C)     Temp Source Oral     SpO2 97 %     Weight      Height      Head Circumference      Peak Flow      Pain Score      Pain Loc      Pain Education      Exclude from Growth Chart    No data found.  Updated Vital Signs BP 139/86 (BP Location: Right Arm)   Pulse 73   Temp 97.8 F (36.6 C) (Oral)   Resp 20   SpO2 97%   Visual Acuity Right Eye Distance:   Left Eye Distance:   Bilateral Distance:    Right Eye Near:   Left Eye Near:    Bilateral Near:     Physical Exam Constitutional:      Appearance: Normal appearance.  HENT:     Right Ear: Tympanic membrane, ear canal and external ear normal.     Left Ear: Tympanic membrane, ear canal and external ear normal.  Nose: Congestion present.     Mouth/Throat:     Pharynx: No oropharyngeal exudate or posterior oropharyngeal erythema.  Eyes:     Extraocular Movements: Extraocular movements intact.  Cardiovascular:     Rate and Rhythm: Normal rate and regular rhythm.     Pulses: Normal pulses.     Heart sounds: Normal heart sounds.  Pulmonary:     Comments: Wheezing to the bilateral upper lobes, remaining lobes clear Neurological:     Mental Status: She is alert and oriented to person, place, and time. Mental status is at baseline.      UC Treatments / Results  Labs (all labs ordered are listed, but only abnormal results are displayed) Labs Reviewed - No data to  display  EKG   Radiology No results found.  Procedures Procedures (including critical care time)  Medications Ordered in UC Medications - No data to display  Initial Impression / Assessment and Plan / UC Course  I have reviewed the triage vital signs and the nursing notes.  Pertinent labs & imaging results that were available during my care of the patient were reviewed by me and considered in my medical decision making (see chart for details).  Acute bronchitis  Vital signs are stable, patient in no signs of distress nontoxic-appearing, wheezing heard to auscultation, O2 saturation 97% on room air, stable for outpatient management low suspicion for pneumonia therefore deferring imaging at this time initiating treatment with doxycycline  as well as prednisone  recommended continue use of inhaler and cough medicine as needed, recommended over-the-counter medications and nonpharmacological supportive care advised follow-up if symptoms continue to persist or worsen Final Clinical Impressions(s) / UC Diagnoses   Final diagnoses:  None   Discharge Instructions   None    ED Prescriptions   None    PDMP not reviewed this encounter.     [1]  Social History Tobacco Use   Smoking status: Never    Passive exposure: Past   Smokeless tobacco: Never  Vaping Use   Vaping status: Never Used  Substance Use Topics   Alcohol use: Not Currently   Drug use: Never     Teresa Shelba SAUNDERS, NP 12/07/24 1346  "

## 2024-12-07 NOTE — ED Triage Notes (Signed)
 Patient reports cough, sob, nasal congestion x 7 days. [facility]. Patient was given Benzonatate . Patient reports symptoms got worse.

## 2024-12-12 ENCOUNTER — Other Ambulatory Visit: Payer: Self-pay | Admitting: Nurse Practitioner

## 2024-12-12 DIAGNOSIS — F419 Anxiety disorder, unspecified: Secondary | ICD-10-CM

## 2024-12-14 ENCOUNTER — Inpatient Hospital Stay: Admission: RE | Admit: 2024-12-14

## 2024-12-14 ENCOUNTER — Ambulatory Visit: Payer: Self-pay | Admitting: Nurse Practitioner

## 2024-12-14 DIAGNOSIS — R928 Other abnormal and inconclusive findings on diagnostic imaging of breast: Secondary | ICD-10-CM

## 2024-12-31 ENCOUNTER — Encounter: Admitting: Physical Medicine and Rehabilitation

## 2025-02-01 ENCOUNTER — Encounter

## 2025-02-01 ENCOUNTER — Ambulatory Visit: Admitting: Nurse Practitioner

## 2025-03-06 ENCOUNTER — Ambulatory Visit: Admitting: Rheumatology

## 2025-05-03 ENCOUNTER — Encounter

## 2025-08-02 ENCOUNTER — Encounter
